# Patient Record
Sex: Female | Born: 1935 | Race: White | Hispanic: No | Marital: Married | Smoking: Former smoker
Health system: Southern US, Community
[De-identification: ages and names within clinical notes are randomized; demographics above are authoritative.]

## PROBLEM LIST (undated history)

## (undated) DIAGNOSIS — N179 Acute kidney failure, unspecified: Secondary | ICD-10-CM

## (undated) DIAGNOSIS — E7800 Pure hypercholesterolemia, unspecified: Secondary | ICD-10-CM

## (undated) DIAGNOSIS — I10 Essential (primary) hypertension: Secondary | ICD-10-CM

## (undated) DIAGNOSIS — G459 Transient cerebral ischemic attack, unspecified: Secondary | ICD-10-CM

## (undated) DIAGNOSIS — I509 Heart failure, unspecified: Secondary | ICD-10-CM

## (undated) DIAGNOSIS — I480 Paroxysmal atrial fibrillation: Secondary | ICD-10-CM

## (undated) HISTORY — PX: BACK SURGERY: SHX140

---

## 1997-12-13 ENCOUNTER — Other Ambulatory Visit: Admission: RE | Admit: 1997-12-13 | Discharge: 1997-12-13 | Payer: Self-pay | Admitting: Family Medicine

## 1998-12-17 ENCOUNTER — Other Ambulatory Visit: Admission: RE | Admit: 1998-12-17 | Discharge: 1998-12-17 | Payer: Self-pay | Admitting: Family Medicine

## 1999-12-23 ENCOUNTER — Other Ambulatory Visit: Admission: RE | Admit: 1999-12-23 | Discharge: 1999-12-23 | Payer: Self-pay | Admitting: Family Medicine

## 1999-12-28 ENCOUNTER — Encounter: Admission: RE | Admit: 1999-12-28 | Discharge: 1999-12-28 | Payer: Self-pay | Admitting: Family Medicine

## 1999-12-28 ENCOUNTER — Encounter: Payer: Self-pay | Admitting: Family Medicine

## 2001-01-02 ENCOUNTER — Encounter: Payer: Self-pay | Admitting: Family Medicine

## 2001-01-02 ENCOUNTER — Encounter: Admission: RE | Admit: 2001-01-02 | Discharge: 2001-01-02 | Payer: Self-pay | Admitting: Family Medicine

## 2001-06-02 ENCOUNTER — Other Ambulatory Visit: Admission: RE | Admit: 2001-06-02 | Discharge: 2001-06-02 | Payer: Self-pay | Admitting: Family Medicine

## 2001-06-05 ENCOUNTER — Encounter: Admission: RE | Admit: 2001-06-05 | Discharge: 2001-06-05 | Payer: Self-pay | Admitting: Family Medicine

## 2001-06-05 ENCOUNTER — Encounter: Payer: Self-pay | Admitting: Family Medicine

## 2001-10-31 ENCOUNTER — Encounter: Admission: RE | Admit: 2001-10-31 | Discharge: 2001-10-31 | Payer: Self-pay | Admitting: Family Medicine

## 2001-10-31 ENCOUNTER — Encounter: Payer: Self-pay | Admitting: Family Medicine

## 2002-05-28 ENCOUNTER — Encounter: Admission: RE | Admit: 2002-05-28 | Discharge: 2002-05-28 | Payer: Self-pay | Admitting: Family Medicine

## 2002-05-28 ENCOUNTER — Encounter: Payer: Self-pay | Admitting: Family Medicine

## 2002-06-04 ENCOUNTER — Other Ambulatory Visit: Admission: RE | Admit: 2002-06-04 | Discharge: 2002-06-04 | Payer: Self-pay | Admitting: Family Medicine

## 2003-06-19 ENCOUNTER — Other Ambulatory Visit: Admission: RE | Admit: 2003-06-19 | Discharge: 2003-06-19 | Payer: Self-pay | Admitting: Family Medicine

## 2003-06-26 ENCOUNTER — Encounter: Admission: RE | Admit: 2003-06-26 | Discharge: 2003-06-26 | Payer: Self-pay | Admitting: Family Medicine

## 2003-07-24 ENCOUNTER — Encounter: Admission: RE | Admit: 2003-07-24 | Discharge: 2003-10-22 | Payer: Self-pay | Admitting: Family Medicine

## 2004-06-25 ENCOUNTER — Encounter: Admission: RE | Admit: 2004-06-25 | Discharge: 2004-06-25 | Payer: Self-pay | Admitting: Family Medicine

## 2004-06-26 ENCOUNTER — Encounter: Admission: RE | Admit: 2004-06-26 | Discharge: 2004-06-26 | Payer: Self-pay | Admitting: Family Medicine

## 2006-06-23 ENCOUNTER — Other Ambulatory Visit: Admission: RE | Admit: 2006-06-23 | Discharge: 2006-06-23 | Payer: Self-pay | Admitting: Family Medicine

## 2006-07-08 ENCOUNTER — Encounter: Admission: RE | Admit: 2006-07-08 | Discharge: 2006-07-08 | Payer: Self-pay | Admitting: Family Medicine

## 2006-07-14 ENCOUNTER — Encounter: Admission: RE | Admit: 2006-07-14 | Discharge: 2006-07-14 | Payer: Self-pay | Admitting: Family Medicine

## 2006-11-06 ENCOUNTER — Inpatient Hospital Stay (HOSPITAL_COMMUNITY): Admission: EM | Admit: 2006-11-06 | Discharge: 2006-11-10 | Payer: Self-pay | Admitting: Emergency Medicine

## 2006-11-21 ENCOUNTER — Encounter: Admission: RE | Admit: 2006-11-21 | Discharge: 2006-11-21 | Payer: Self-pay | Admitting: Neurosurgery

## 2006-11-25 ENCOUNTER — Inpatient Hospital Stay (HOSPITAL_COMMUNITY): Admission: RE | Admit: 2006-11-25 | Discharge: 2006-11-28 | Payer: Self-pay | Admitting: Neurosurgery

## 2006-12-25 ENCOUNTER — Ambulatory Visit: Payer: Self-pay | Admitting: Internal Medicine

## 2006-12-25 ENCOUNTER — Inpatient Hospital Stay (HOSPITAL_COMMUNITY): Admission: EM | Admit: 2006-12-25 | Discharge: 2006-12-30 | Payer: Self-pay | Admitting: Emergency Medicine

## 2006-12-27 ENCOUNTER — Encounter: Payer: Self-pay | Admitting: Internal Medicine

## 2006-12-27 ENCOUNTER — Encounter (INDEPENDENT_AMBULATORY_CARE_PROVIDER_SITE_OTHER): Payer: Self-pay | Admitting: Neurological Surgery

## 2006-12-28 ENCOUNTER — Ambulatory Visit: Payer: Self-pay | Admitting: Physical Medicine & Rehabilitation

## 2007-11-02 ENCOUNTER — Encounter: Admission: RE | Admit: 2007-11-02 | Discharge: 2007-11-02 | Payer: Self-pay | Admitting: Family Medicine

## 2007-12-02 ENCOUNTER — Emergency Department (HOSPITAL_COMMUNITY): Admission: EM | Admit: 2007-12-02 | Discharge: 2007-12-02 | Payer: Self-pay | Admitting: Emergency Medicine

## 2008-06-20 ENCOUNTER — Emergency Department (HOSPITAL_COMMUNITY): Admission: EM | Admit: 2008-06-20 | Discharge: 2008-06-20 | Payer: Self-pay | Admitting: Emergency Medicine

## 2010-03-05 ENCOUNTER — Inpatient Hospital Stay (HOSPITAL_COMMUNITY): Admission: EM | Admit: 2010-03-05 | Discharge: 2010-01-05 | Payer: Self-pay | Admitting: Emergency Medicine

## 2010-06-11 LAB — BASIC METABOLIC PANEL
BUN: 26 mg/dL — ABNORMAL HIGH (ref 6–23)
CO2: 27 mEq/L (ref 19–32)
Calcium: 9.3 mg/dL (ref 8.4–10.5)
Chloride: 101 mEq/L (ref 96–112)
Chloride: 103 mEq/L (ref 96–112)
Chloride: 107 mEq/L (ref 96–112)
Creatinine, Ser: 1.21 mg/dL — ABNORMAL HIGH (ref 0.4–1.2)
Creatinine, Ser: 1.5 mg/dL — ABNORMAL HIGH (ref 0.4–1.2)
GFR calc Af Amer: 40 mL/min — ABNORMAL LOW (ref >60)
GFR calc Af Amer: 51 mL/min — ABNORMAL LOW (ref >60)
GFR calc non Af Amer: 42 mL/min — ABNORMAL LOW (ref >60)
GFR calc non Af Amer: 42 mL/min — ABNORMAL LOW (ref >60)
GFR calc non Af Amer: 52 mL/min — ABNORMAL LOW (ref >60)
Glucose, Bld: 118 mg/dL — ABNORMAL HIGH (ref 70–99)
Glucose, Bld: 122 mg/dL — ABNORMAL HIGH (ref 70–99)
Potassium: 3.8 mEq/L (ref 3.5–5.1)
Potassium: 4.1 mEq/L (ref 3.5–5.1)
Potassium: 4.4 mEq/L (ref 3.5–5.1)
Potassium: 4.5 mEq/L (ref 3.5–5.1)
Sodium: 137 mEq/L (ref 135–145)
Sodium: 137 mEq/L (ref 135–145)
Sodium: 138 mEq/L (ref 135–145)

## 2010-06-11 LAB — CARDIAC PANEL(CRET KIN+CKTOT+MB+TROPI)
CK, MB: 5.8 ng/mL — ABNORMAL HIGH (ref 0.3–4.0)
CK, MB: 8.2 ng/mL (ref 0.3–4.0)
Relative Index: 2.6 — ABNORMAL HIGH (ref 0.0–2.5)
Relative Index: 3 — ABNORMAL HIGH (ref 0.0–2.5)
Relative Index: 3 — ABNORMAL HIGH (ref 0.0–2.5)
Total CK: 270 U/L — ABNORMAL HIGH (ref 7–177)
Troponin I: 0.02 ng/mL (ref 0.00–0.06)

## 2010-06-11 LAB — COMPREHENSIVE METABOLIC PANEL
ALT: 17 U/L (ref 0–35)
AST: 28 U/L (ref 0–37)
Albumin: 3.7 g/dL (ref 3.5–5.2)
CO2: 27 mEq/L (ref 19–32)
Calcium: 8.7 mg/dL (ref 8.4–10.5)
Creatinine, Ser: 1.53 mg/dL — ABNORMAL HIGH (ref 0.4–1.2)
GFR calc Af Amer: 39 mL/min — ABNORMAL LOW (ref >60)
GFR calc non Af Amer: 32 mL/min — ABNORMAL LOW (ref >60)
Sodium: 139 mEq/L (ref 135–145)
Total Protein: 6.5 g/dL (ref 6.0–8.3)

## 2010-06-11 LAB — DIFFERENTIAL
Basophils Absolute: 0 10*3/uL (ref 0.0–0.1)
Basophils Relative: 0 % (ref 0–1)
Eosinophils Absolute: 0.1 10*3/uL (ref 0.0–0.7)
Eosinophils Relative: 1 % (ref 0–5)
Monocytes Absolute: 0.3 10*3/uL (ref 0.1–1.0)
Monocytes Relative: 5 % (ref 3–12)
Neutro Abs: 5.3 10*3/uL (ref 1.7–7.7)

## 2010-06-11 LAB — CBC
HCT: 30.9 % — ABNORMAL LOW (ref 36.0–46.0)
HCT: 31.7 % — ABNORMAL LOW (ref 36.0–46.0)
HCT: 35.8 % — ABNORMAL LOW (ref 36.0–46.0)
Hemoglobin: 10.7 g/dL — ABNORMAL LOW (ref 12.0–15.0)
Hemoglobin: 10.8 g/dL — ABNORMAL LOW (ref 12.0–15.0)
Hemoglobin: 10.9 g/dL — ABNORMAL LOW (ref 12.0–15.0)
Hemoglobin: 12.3 g/dL (ref 12.0–15.0)
MCH: 32.1 pg (ref 26.0–34.0)
MCH: 32.2 pg (ref 26.0–34.0)
MCH: 32.4 pg (ref 26.0–34.0)
MCHC: 34.2 g/dL (ref 30.0–36.0)
MCHC: 34.4 g/dL (ref 30.0–36.0)
MCHC: 34.5 g/dL (ref 30.0–36.0)
MCV: 93.2 fL (ref 78.0–100.0)
MCV: 93.9 fL (ref 78.0–100.0)
Platelets: 122 10*3/uL — ABNORMAL LOW (ref 150–400)
Platelets: 131 10*3/uL — ABNORMAL LOW (ref 150–400)
RBC: 3.29 MIL/uL — ABNORMAL LOW (ref 3.87–5.11)
RBC: 3.31 MIL/uL — ABNORMAL LOW (ref 3.87–5.11)
RDW: 12.9 % (ref 11.5–15.5)
RDW: 13.1 % (ref 11.5–15.5)
RDW: 13.3 % (ref 11.5–15.5)
WBC: 5.3 10*3/uL (ref 4.0–10.5)
WBC: 5.4 10*3/uL (ref 4.0–10.5)

## 2010-06-11 LAB — SODIUM, URINE, RANDOM: Sodium, Ur: 67 mEq/L

## 2010-08-11 NOTE — H&P (Signed)
Yvonne [name], Yvonne [name]             ACCOUNT NO.:  [id number]   MEDICAL RECORD NO.:  [id number]          PATIENT TYPE:  INP   LOCATION:  1422                         FACILITY:  [facility]   PHYSICIAN:  Hollice Espy, M.D.DATE OF BIRTH:  Aug 19, 1918   DATE OF ADMISSION:  11/06/2006  DATE OF DISCHARGE:                              HISTORY & PHYSICAL   PRIMARY CARE PHYSICIAN:  Chales Salmon. Abigail Miyamoto, M.D.   CHIEF COMPLAINT:  Weakness.   HISTORY OF PRESENT ILLNESS:  The patient is an 77 year old, white female  with past medical history of lower lumbar DJD who for the last few days  has been doing relatively well, although she has noted a cough with  greenish productive sputum.  She says otherwise, she has not felt short  of breath or no chest pain.  Today, she has been status post two  epidural injections for her lower back pain with moderate relief, but  today, she started feeling weak and started feeling that she could not  stand up.  When she started to fall over, she was able to ease herself  to the ground, but could not get up.  She called a friend who was able  to call paramedics and the patient was taken over to the emergency room.  In the emergency room, it was noted that she had a cough and she had a  chest x-ray which showed a nonspecific infiltrate.  She also had an EKG  done which showed flipped T waves in leads V2-V6.  No other EKG was  present.  With these findings, it was felt best that she come in to the  hospital as well as for balance and stability.  Currently, the patient  is actually doing well.  She says that if she does not turn her back a  certain way, she does not have bilateral leg and lower back pain.  She  does complain of continued productive cough, but she denies any  headaches, vision changes, dysphagia, no chest pain or palpitations and  no shortness of breath.  She does complain of some mild wheezing, no  abdominal pain, no hematuria, no dysuria, no constipation  or diarrhea.  She does complain of lower back pain which radiates down her bilateral  lower legs.  Her review of systems is otherwise negative.   PAST MEDICAL HISTORY:  1. Lumbar DJD.  2. History of arrhythmia in the past.   MEDICATIONS:  1. Flexeril 5 mg p.o. t.i.d.  2. Simvastatin 80 mg p.o. daily.  3. Cardizem 90 mg p.o. daily.  4. Ultram 50 mg p.o. four times a day p.r.n.   ALLERGIES:  No known drug allergies.   SOCIAL HISTORY:  Denies any tobacco, drug use or heavy alcohol use.  She  drinks socially.   FAMILY HISTORY:  Noncontributory.   PHYSICAL EXAMINATION:  VITAL SIGNS:  Temperature 98.2, heart rate 88,  blood pressure 132/51, respirations 15, O2 saturations 96% on room air.  GENERAL:  The patient is alert and oriented x3 in no apparent distress.  HEENT:  Normocephalic, atraumatic.  Mucous membranes are moist.  No  carotid bruits.  HEART:  Regular rate and rhythm, S1, S2.  LUNGS:  A few scattered rales.  ABDOMEN:  Soft, nontender, nondistended, positive bowel sounds.  EXTREMITIES:  No clubbing, cyanosis or edema, trace pitting edema.   LABORATORY DATA AND X-RAY FINDINGS:  BNP 66.  White count 9.3, H&H 13  and 38, MCV 91, platelet count 178, 86% neutrophils.  CPK 119, MB 2,  troponin I less than 0.05.  Blood cultures are drawn as per pneumonia  protocol.  Second set of cardiac markers are similar.   X-rays as per HPI.  EKG shows some flipped T waves in V2-V6.   ASSESSMENT/PLAN:  1. Pneumonia.  Will treat the patient on intravenous Avelox plus      nebulizers.  2. Abnormal electrocardiogram.  Will try to review a previous      electrocardiogram.  In the meantime, repeat electrocardiogram and      cardiac enzymes in the morning.  The patient has had no episodes of      chest pain.  3. History of arrhythmia.  She is status post a cardiac      catheterization in the past which has been completely unremarkable.  4. Weakness, likely secondary to lumbar degenerative  joint disease.      Will get a physical/occupational therapy evaluation.  Consider      palliative care if her pain is not able to be controlled.      Hollice Espy, M.D.  Electronically Signed     SKK/MEDQ  D:  11/06/2006  T:  11/07/2006  Job:  [id number]   cc:   Hilda Lias, M.D.  Fax: [phone]   Chales Salmon. Abigail Miyamoto, M.D.  Fax: [phone]

## 2010-08-11 NOTE — Op Note (Signed)
NAMEUCHENNA, SEUFERT             ACCOUNT NO.:  [id number]   MEDICAL RECORD NO.:  [id number]          PATIENT TYPE:  INP   LOCATION:  3028                         FACILITY:  [facility]   PHYSICIAN:  Stefani Dama, M.D.  DATE OF BIRTH:  06-Oct-1918   DATE OF PROCEDURE:  12/27/2006  DATE OF DISCHARGE:                               OPERATIVE REPORT   PREOPERATIVE DIAGNOSIS:  Osteoporotic compression fracture.   POSTOPERATIVE DIAGNOSIS:  Osteoporotic compression fracture.   PROCEDURE:  L4 acrylic balloon kyphoplasty.   SURGEON:  Stefani Dama, M.D.   FIRST ASSISTANT:  None.   ANESTHESIA:  General endotracheal.   INDICATIONS:  Yvonne [name] is an 77 year old individual who underwent  laminectomy a few months ago by Dr. Jeral Fruit.  She was recovering well  with her back but developed a sudden and severe onset of pain when she  was getting up one day and was found to have now subacute compression  fractures likely secondary to osteoporosis at the L4 vertebra.  She is  now taken to the procedure room to undergo kyphoplasty.   PROCEDURE:  The patient brought to the operating room supine on the  stretcher.  After smooth induction of general endotracheal anesthesia  she was turned prone onto some lateral rolls and fluoroscopy was used to  image lumbar spine.  L4 vertebra was isolated, the skin was prepped with  DuraPrep and draped sterilely and then the area lateral to the pedicles  of L4 was anesthetized on the surface of the skin with 8 mL of lidocaine  with epinephrine 1:100,000 total and stab incision was created in this  region with #11 blade.  A Jamshidi needle was then passed to the lateral  superior aspect of the pedicle of L4 and then passed through the  pedicular bone into the vertebral body all the time using a AP and  lateral fluoro guidance.  On the left side then, a biopsy was obtained  and the bone was noted be severely softened and weak.  A balloon was  then deployed into  this area and the balloon was inflated but remained  isolated to the left side.  The right side was then instrumented in a  similar fashion and a balloon was deployed here and seemed to migrate  slightly to the medial aspect, the balloons were filled to 120 mmHg  pressure and decayed to consistently down to a level of 60.  Glue was  mixed at this point and when it reached the appropriate consistency a  total of 4.5 mL of glue was inserted into the left-sided aperture and a  total of 4 mL of glue was inserted into the right aperture.  This was  done under fluoroscopic control.  The filling was good across the  midline and seemed to restore the height of the vertebra nicely.  At  this point, the procedure was completed by removing  first trocars and then removing the Jamshidi needles.  No tails were  noted.  The skin was closed with a single 3-0 Vicryl suture in inverted  interrupted fashion in the subcuticular area.  The patient tolerated  procedure well, was returned to recovery in stable condition.      Stefani Dama, M.D.  Electronically Signed     HJE/MEDQ  D:  12/27/2006  T:  12/28/2006  Job:  [id number]

## 2010-08-11 NOTE — Discharge Summary (Signed)
Yvonne [name], Yvonne [name]             ACCOUNT NO.:  [id number]   MEDICAL RECORD NO.:  [id number]          PATIENT TYPE:  INP   LOCATION:  1422                         FACILITY:  [facility]   PHYSICIAN:  Yvonne [name], MDDATE OF BIRTH:  1918-05-08   DATE OF ADMISSION:  11/06/2006  DATE OF DISCHARGE:  11/10/2006                               DISCHARGE SUMMARY   PRIMARY CARE Yvonne [name]:  Dr. Abigail [name].   DISCHARGE DIAGNOSES:  1. Pneumonia.  2. Chronic back pain secondary to lumbar degenerative joint disease.  3. Hypertension, uncontrolled.  4. Constipation.   DISCHARGE MEDICATIONS:  1. Zocor 18 mg p.o. once daily.  2. Flexeril 500 t.i.d.  3. Cardizem XT 180 mg p.o. once daily.  4. Avelox 400 mg p.o. once daily for seven days and then stop.  5. Ultram 50 mg t.i.d.  6. Colace 100 mg p.o. b.i.d.  7. Gabapentin 300 mg p.o. b.i.d.  8. Senna 2 tabs p.o. every night.   CONSULTS:  I spoke with Dr. Jeral [name] on the phone. Apparently, the patient  is well known to Dr. Jeral [name].  He plans to see the patient in one to two  weeks.  Dr. Jeral [name] has promised to call the patient at home and make an  appointment.   IMAGING STUDIES:  1. Chest x-ray showed increased retrocardiac density on the left which      may reflect an infiltrate.   BRIEF HISTORY/HOSPITAL COURSE:  Please refer to H&P done on 11/06/2006.   The patient is an 77 year old female with a past medical history  significant for lumbar DJD and arrhythmias.  The patient presented with  weakness, cough productive of greenish sputum and low back pain.  A  chest x-ray done revealed retrocardiac infiltrate.  White count on  admission  was 9.3 with a left shift.   The patient was started on IV Avelox.  She was also given neb treatment.  The back pain was adequately controlled.  During her stay in the  hospital, the patient's blood pressure was noted to be elevated from  time to time.  The patient's Cardizem was increased to 180 mg once  daily.   Dr. Jeral [name] was contacted over the phone; please see above.   The patient has done well and will be discharged back home today to the  care of the primary care Yvonne [name].   DISCHARGE PLAN:  1. Start patient on above medications at home.  2. Follow up with Dr. Jeral [name] in one to two weeks.  3. Cardiac diet p.r.n.  4. Activity as tolerated.      Yvonne Chapel, MD  Electronically Signed     SIO/MEDQ  D:  11/10/2006  T:  11/10/2006  Job:  [id number]   cc:   Yvonne [name]. Yvonne [name], M.D.  Fax: [phone]   Yvonne [name], M.D.  Fax: [phone]

## 2010-08-11 NOTE — Discharge Summary (Signed)
[name], [name]             ACCOUNT NO.:  [id number]   MEDICAL RECORD NO.:  [id number]          PATIENT TYPE:  INP   LOCATION:  3029                         FACILITY:  [facility]   PHYSICIAN:  Hewitt Shorts, M.D.DATE OF BIRTH:  05-06-1918   DATE OF ADMISSION:  11/25/2006  DATE OF DISCHARGE:  11/28/2006                               DISCHARGE SUMMARY   HISTORY OF PRESENT ILLNESS:  The patient is an 77 year old woman under  the care of Dr. Jeral Fruit.  She had pain in her back, extending down into  the lower extremities bilaterally with multi-level, multi-factorial  lumbar stenosis.  The patient was admitted now for a lumbar  decompression from L3-S1.  Details of her history and examination are  included in Dr. Cassandria Santee admission note.   HOSPITAL COURSE:  The patient was admitted and underwent an L3, L4 and  L5 laminectomy with decompression of the L3, L4, L5 and S1 nerve roots.  Following surgery, she has done well.  We initially had her treated with  PCA that was discontinued.  She was seen in consultation by physical  therapy and occupational therapy and has made good progress.  She is up  and ambulating.  Her wound is healing nicely.  She is to return in 3  weeks to see Dr. Jeral Fruit for followup.  She has been given a prescription  for Percocet 1-2 tablets p.o. q.4-6 hours p.r.n. pain, 50 tablets, no  refills.  She will return to see Dr. Jeral Fruit in 3 weeks.   DISCHARGE DIAGNOSIS:  Lumbar stenosis.      Hewitt Shorts, M.D.  Electronically Signed     RWN/MEDQ  D:  11/28/2006  T:  11/28/2006  Job:  [id number]

## 2010-08-11 NOTE — Op Note (Signed)
NAMEHENLEE, DONOVAN             ACCOUNT NO.:  [id number]   MEDICAL RECORD NO.:  [id number]          PATIENT TYPE:  INP   LOCATION:  3029                         FACILITY:  [facility]   PHYSICIAN:  Hilda Lias, M.D.   DATE OF BIRTH:  Nov 20, 1918   DATE OF PROCEDURE:  11/25/2006  DATE OF DISCHARGE:                               OPERATIVE REPORT   PREOPERATIVE DIAGNOSIS:  L3-L4, L4-L5, and L5-S1 stenosis with chronic  radiculopathy and acute exacerbation.   POSTOPERATIVE DIAGNOSIS:  L3-L4, L4-L5, and L5-S1 stenosis with chronic  radiculopathy and acute exacerbation.   PROCEDURE:  Bilaterally L3, L4, L5 laminectomy, foraminotomy to  decompress the L3, L4, L5, and S1 nerve roots.   SURGEON:  Hilda Lias, M.D.   ASSISTANT:  Hewitt Shorts, M.D.   CLINICAL HISTORY:  The patient was seen by me in my office because of  back pain with radiation to both legs.  Later on she was admitted to  [facility] because of worsening of the pain.  Myelogram showed  that she, indeed, has stenosis at the level of L3-L4, L4-L5, and L5-S1.  Surgery was advised.  The risks were explained to her in the history and  physical.   DESCRIPTION OF PROCEDURE:  Ms. Birenbaum was taken to the OR and she was  positioned in a prone manner.  The skin was cleaned with DuraPrep.  We  inserted a needle in the spinous process and an x-ray was taken prior to  surgery but we were unable to see the needle whatsoever. From then on,  we palpated the L4-L5 space and an incision from L3 down to L5-S1 was  made.  The muscles were retracted laterally.  X-rays showed that,  indeed, we were in the spinous process of L3-L4.  Then with the Stille  rongeur, we removed the spinous process of L3, L4, and L5.  With the  drill, using the large and small drill, we did bilateral laminectomies.  The patient has quite a bit of thickening and calcified yellow ligament.  Using the 2, 3, and 4 mm Kerrison punch, we removed the  ligament  bilaterally and we did total laminectomy.  Then, using the same Kerrison  punch and the drill, we decompressed the L3, L4, L5, and S1 nerve roots.  The patient has quite a bit of calcification and quite a bit of  stenosis, worse at the level of L3-L4 and L4-L5.  From then on, having a  good decompression, the area was irrigated.  Valsalva maneuver was  negative.  Then, the wound was closed with Vicryl and Steri-Strips.           ______________________________  Hilda Lias, M.D.     EB/MEDQ  D:  11/25/2006  T:  11/27/2006  Job:  [id number]

## 2010-08-11 NOTE — Consult Note (Signed)
Yvonne [name], Yvonne [name]             ACCOUNT NO.:  [id number]   MEDICAL RECORD NO.:  [id number]          PATIENT TYPE:  INP   LOCATION:  3028                         FACILITY:  [facility]   PHYSICIAN:  Hilda Lias, M.D.   DATE OF BIRTH:  05/14/1918   DATE OF CONSULTATION:  12/25/2006  DATE OF DISCHARGE:                                 CONSULTATION   Yvonne [name] is a lady who underwent lumbar laminectomy several weeks ago.  She was seen by me 3 days ago in my office.  Complained of increased  back pain, and some pain down to her left leg.  X-ray shows some  osteoporosis with a surgical defect, but no evidence of fracture.  The  patient last night developed worsening of pain, and she was taken by  ambulance to [facility], where she was admitted, and an x-ray showed  some fracture of the vertebral body .  She was admitted by Dr. Suanne Marker.  I looked at the x-ray, and there has been some difference between the  one I saw her on Friday and from last night.  Because of that, we  recommended for Yvonne [name] to be transferred to [facility]  where we are going to continue with the pain control.  Also, we are  going to do an MRI to see if indeed how extensive is the fracture that  is mostly in the anterior vertebral body of L4, and what is the  relationship between that and the L4 and L5 nerve root on the left side.  The decision will be made to see if we need to do some type of  kyphoplasty of the L4 and also the __________ .  I fully explained to  her and the lady who was with her, and all of [place] agreed.           ______________________________  Hilda Lias, M.D.     EB/MEDQ  D:  12/25/2006  T:  12/25/2006  Job:  [id number]

## 2010-08-11 NOTE — H&P (Signed)
Yvonne [name], GRIMME             ACCOUNT NO.:  [id number]   MEDICAL RECORD NO.:  [id number]          PATIENT TYPE:  INP   LOCATION:  3028                         FACILITY:  [facility]   PHYSICIAN:  Corwin Levins, MD      DATE OF BIRTH:  02-19-1919   DATE OF ADMISSION:  12/25/2006  DATE OF DISCHARGE:                              HISTORY & PHYSICAL   CHIEF COMPLAINT:  Worsening low back pain.   HISTORY OF PRESENT ILLNESS:  Yvonne [name] is an 77 year old white female,  here with her daughter, who simply has been unable to function well  enough at home to go home.  She is status post L3 to S1 lumbar  decompression approximately 3 weeks ago.  She saw Dr. Jeral Fruit several  days ago postoperative.  She had fallen just several days before and she  complains of pain for the past week.  Plain films were done apparently  per Dr. Cassandria Santee office.  Plain films were repeated tonight with the  same being for patient and new L4 compression fracture noted per  Radiology.  She is simply unable to function well enough and ambulate to  go home.  Again, the pain is worse in the left lower back to the left  foot.   PAST MEDICAL HISTORY:  Illnesses:  1. History of lumbar stenosis status post L3 to S1 surgery August,      2008.  2. Depression.  3. Hypercholesterolemia.  4. Hypertension.  5. History of arrhythmia.  6. History of pneumonia August, 2008.   FAMILY HISTORY:  Noncontributory.   SOCIAL HISTORY:  1. No tobacco.  2. Alcohol, social use.   ILLICIT DRUGS:  None.   ALLERGIES:  No known drug allergies.   CURRENT MEDICATIONS:  Vicodin, Lexapro, meloxicam, metoprolol and Zocor,  doses of which are unclear.   REVIEW OF SYSTEMS:  Otherwise noncontributory.   PHYSICAL EXAM:  She is afebrile.  Pulse 72, respirations 18, blood  pressure 168/119, O2 saturation 97%.  ENT EXAM:  Sclera clear are clear. TMs are clear.  Oropharynx benign.  NECK:  Without lymph nodes, JVD, thyromegaly.  CHEST:  No rales  or wheezing.  HEART EXAM:  A regular rate and rhythm, no murmur.  ABDOMEN:  Soft, nontender.  Positive bowel sounds.  EXTREMITIES:  No edema.  NEUROLOGICAL:  Alert and oriented x3 and otherwise nonfocal.  We did not  force her to sit up, even though she is doing a bit better after Toradol  in the ER.   LABS:  A BMET with glucose 134, otherwise within normal limits.  Hemoglobin 11.2, white blood cell count 4.7, UA negative.  LS plain  films with new L4 compression fracture and a questionable bone fragment,  as above.  Pelvis films negative.   ASSESSMENT AND PLAN:  Intractable low back pain with prostration and new  L4 compression fracture.  She is to be admitted for pain control,  physical therapy, if able; neurosurgeon will likely need to see while  inpatient.  She is adamant that she does not want a closed MRI or CT and  this  may have to be readdressed.      Corwin Levins, MD  Electronically Signed     JWJ/MEDQ  D:  12/25/2006  T:  05/15/2007  Job:  [id number]

## 2010-08-11 NOTE — H&P (Signed)
[name], Yvonne [name]             ACCOUNT NO.:  [id number]   MEDICAL RECORD NO.:  [id number]          PATIENT TYPE:  INP   LOCATION:  3028                         FACILITY:  [facility]   PHYSICIAN:  Kela Millin, M.D.DATE OF BIRTH:  Apr 19, 1918   DATE OF ADMISSION:  12/25/2006  DATE OF DISCHARGE:  12/30/2006                              HISTORY & PHYSICAL   DICTATED BY:  __________   CHIEF COMPLAINT:  Worsening low back pain.   HISTORY OF PRESENT ILLNESS:  Yvonne [name] is an 77 year old white female,  here with her daughter, who simply has been unable to function well  enough at home to go home.  She is status post L3 to S1 lumbar  decompression approximately 3 weeks ago.  She saw Dr. Jeral Fruit several  days ago postoperative.  She had fallen just several days before and she  complains of pain for the past week.  Plain films were done apparently  per Dr. Cassandria Santee office.  Plain films were repeated tonight with the  same being for patient and new L4 compression fracture noted per  Radiology.  She is simply unable to function well enough and ambulate to  go home.  Again, the pain is worse in the left lower back to the left  foot.   PAST MEDICAL HISTORY:  Illnesses:  1. History of lumbar stenosis status post L3 to S1 surgery August,      2008.  2. Depression.  3. Hypercholesterolemia.  4. Hypertension.  5. History of arrhythmia.  6. History of pneumonia August, 2008.   FAMILY HISTORY:  Noncontributory.   SOCIAL HISTORY:  1. No tobacco.  2. Alcohol, social use.   ILLICIT DRUGS:  None.   ALLERGIES:  No known drug allergies.   CURRENT MEDICATIONS:  Vicodin, Lexapro, meloxicam, metoprolol and Zocor,  doses of which are unclear.   REVIEW OF SYSTEMS:  Otherwise noncontributory.   PHYSICAL EXAM:  She is afebrile.  Pulse 72, respirations 18, blood  pressure 168/119, O2 saturation 97%.  ENT EXAM:  Sclera clear are clear, TMs are clear, oropharynx benign.  NECK:  Without lymph  nodes, JVD, thyromegaly.  CHEST:  No rales or wheezing.  HEART EXAM:  A regular rate and rhythm, no murmur.  ABDOMEN:  Soft, nontender.  Positive bowel sounds.  EXTREMITIES:  No edema.  NEUROLOGICAL:  Alert and oriented x3 and otherwise nonfocal.  We did not  force her to sit up, even though she is doing a bit better after Toradol  in the ER.   LABS:  A BMET with glucose 134, otherwise within normal limits.  Hemoglobin 11.2, white blood cell count 4.7, UA negative.  LS plain  films with new L4 compression fracture and a questionable bone fragment,  as above.  Pelvis films negative.   ASSESSMENT AND PLAN:  Intractable low back pain with prostration and new  L4 compression fracture.  She is to be admitted for pain control,  physical therapy, if able; neurosurgeon will likely need to see while  inpatient.  She is adamant that she does not want a closed MRI or CT and  this may have to be readdressed.      Corinna L. Lendell Caprice, MD      Kela Millin, M.D.     CLS/MEDQ  D:  12/25/2006  T:  12/25/2006  Job:  [id number]   cc:   Chales Salmon. Abigail Miyamoto, M.D.  Fax: [phone]   Hilda Lias, M.D.  Fax: [phone]

## 2010-08-11 NOTE — Discharge Summary (Signed)
NAMELINDSEE, LABARRE             ACCOUNT NO.:  [id number]   MEDICAL RECORD NO.:  [id number]          PATIENT TYPE:  INP   LOCATION:  3028                         FACILITY:  [facility]   PHYSICIAN:  Corinna L. Lendell Caprice, MDDATE OF BIRTH:  Feb 23, 1919   DATE OF ADMISSION:  12/25/2006  DATE OF DISCHARGE:  12/30/2006                               DISCHARGE SUMMARY   STAT DISCHARGE SUMMARY   DISCHARGE DIAGNOSES:  1. L4 osteoporotic compression fracture.  2. Status post balloon kyphoplasty by Dr. Danielle Dess on December 27, 2006.  3. Deconditioning.  4. Recent L3,4,5 bilateral laminectomy for lumbar stenosis and      radiculopathy.  5. Constipation.  6. History of depression, not currently active/in remission.  7. Hyperlipidemia.  8. Hypertension.   DISCHARGE MEDICATIONS:  1. Colace 100 mg p.o. b.i.d.  2. MiraLax 17 g p.o. daily as needed for constipation.  3. Senicot 2 p.o. q.h.s. p.r.n. constipation.  4. Toprol XL 25 mg a day.  5. Simvastatin 80 mg a day.  6. Lexapro 10 mg a day.  7. Tylenol 650 mg p.o. q.4 h. p.r.n. pain.  8. Aspirin 325 mg a day.  9. Percocet one p.o. q.6 h. p.r.n. pain.  10.Flexeril 10 mg p.o. q.8 h. p.r.n. muscle spasm.   CONDITION:  Stable.   ACTIVITY:  Continue physical therapy, occupational therapy.   CONSULTATIONS:  Dr. Danielle Dess.   PROCEDURE:  See above.   DIET:  Low salt.  Follow-up with Dr. Jeral Fruit in 2 weeks.   PERTINENT LABORATORIES:  CBC significant for a hemoglobin of 10.8,  hematocrit 30.6, otherwise unremarkable.  Basic metabolic panel  unremarkable.  Urinalysis negative.   SPECIAL STUDIES AND RADIOLOGY:  Two views of the pelvis shows fracture  involving L4 vertebral body.  Lumbar x-ray showed postop changes in the  lower lumbar spine, compression fracture involving the right superior  aspect of L4.  MRI of the lumbar spine showed acute or subacute L4  compression fracture between 25 and 50 percent loss of vertebral body  height without  evidence of infection or osteomyelitis.  Postoperative  seroma and laminectomy defect L3 through L5.  Multi-level degenerative  disease.   HISTORY AND HOSPITAL COURSE:  Ms Quackenbush is an 77 year old White female  patient of Dr. Abigail Miyamoto who had recently had a laminectomy by Dr. Jeral Fruit.  Please see H&P for complete admission details.  She had been at home  postoperatively and had fallen several days prior to admission.  She had  complained of worsening back pain.  Plain films were reportedly done at  Dr. Cassandria Santee office, but she presented to the Emergency Room with the  complaints.  X-rays were repeated and she was found to have a new L4  compression fracture.  She was having difficulty ambulating and lives  alone.  She was therefore admitted to the hospital for further work-up,  pain control, etc.  Dr. Joslyn Devon al were consulted.  Patient had an MRI  and vertebroplasty with good results.  She has had difficulty ambulating  and is being transferred to skilled nursing facility for continued  rehabilitation.   During her hospitalization, she had constipation which has improved with  laxatives.  She will need to have stool softeners and laxatives as  needed at the nursing home.   Her other medical problems remain stable.  She does have a history of  depression, but has no signs or symptoms of active depression currently.      Corinna L. Lendell Caprice, MD  Electronically Signed     CLS/MEDQ  D:  12/30/2006  T:  12/30/2006  Job:  [id number]

## 2010-08-11 NOTE — H&P (Signed)
[name], Yvonne [name]             ACCOUNT NO.:  [id number]   MEDICAL RECORD NO.:  [id number]          PATIENT TYPE:  INP   LOCATION:  3029                         FACILITY:  [facility]   PHYSICIAN:  Hilda Lias, M.D.   DATE OF BIRTH:  1919-03-18   DATE OF ADMISSION:  11/25/2006  DATE OF DISCHARGE:                              HISTORY & PHYSICAL   Ms. Yvonne [name] is a lady who was seen in my office  on regular occasion  complaining of back pain going down to the both legs. All this started  when she fell about 3 years ago.  The patient is getting worse. She was  admitted to [facility] 2 weeks ago because of worsening pain and  myelogram was done which showed stenosis at L2, L3, L4, L4-5, L5-S1. She  wanted to proceed with surgery because she feels that she is getting  worse.   PAST MEDICAL HISTORY:  Negative.   ALLERGIES:  She is not allergic to any medications.   SOCIAL HISTORY:  Negative.   MEDICATIONS:  She is taking some metoprolol, aspirin and another  medication which she does not recall the name.   PHYSICAL EXAMINATION:  HEAD/NOSE/THROAT:  Normal.  NECK:  Normal.  LUNGS:  There is some mild rhonchi bilaterally.  HEART:  Heart sounds normal.  ABDOMEN:  Normal.  EXTREMITIES:  Normal pulses.  NEURO:  She has some weakness in both quadriceps. She has some mild  weakness and dorsiflexion in both legs.  The straight leg raising, SLR  is positive at 80 degrees and the femoral stretch maneuver is positive  bilaterally.  The lumbar myelogram showed that she has stenosis at the  level of L3-4 with a large left herniated disk, severe stenosis at the  level of L4-5, right worse than the left one and also facet arthropathy  with a stenosis at the level of L5-S1. She has a moderate narrowed canal  at the level of 2-3 to the left.   IMPRESSION:  Lumbar stenosis L3-4, L4-5, L5-S1.   RECOMMENDATIONS:  The patient wants to proceed with surgery.  The  procedure will be a bilateral L3, L4,  L5 laminectomy and foraminotomy.  She knows about the risks such as infection, CSF leak, no good  whatsoever, need for further surgery which might require fusion,  bleeding in the abdomen and all the risks associated with her age.           ______________________________  Hilda Lias, M.D.     EB/MEDQ  D:  11/25/2006  T:  11/27/2006  Job:  [id number]

## 2010-12-24 ENCOUNTER — Emergency Department (HOSPITAL_COMMUNITY): Payer: Medicare Other

## 2010-12-24 ENCOUNTER — Emergency Department (HOSPITAL_COMMUNITY)
Admission: EM | Admit: 2010-12-24 | Discharge: 2010-12-24 | Disposition: A | Discharge status: Home / Self Care | Payer: Medicare Other | Attending: Emergency Medicine | Admitting: Emergency Medicine

## 2010-12-24 DIAGNOSIS — R5381 Other malaise: Secondary | ICD-10-CM | POA: Insufficient documentation

## 2010-12-24 DIAGNOSIS — H81399 Other peripheral vertigo, unspecified ear: Secondary | ICD-10-CM | POA: Insufficient documentation

## 2010-12-24 DIAGNOSIS — R269 Unspecified abnormalities of gait and mobility: Secondary | ICD-10-CM | POA: Insufficient documentation

## 2010-12-24 DIAGNOSIS — J4489 Other specified chronic obstructive pulmonary disease: Secondary | ICD-10-CM | POA: Insufficient documentation

## 2010-12-24 DIAGNOSIS — R51 Headache: Secondary | ICD-10-CM | POA: Insufficient documentation

## 2010-12-24 DIAGNOSIS — Z79899 Other long term (current) drug therapy: Secondary | ICD-10-CM | POA: Insufficient documentation

## 2010-12-24 DIAGNOSIS — I10 Essential (primary) hypertension: Secondary | ICD-10-CM | POA: Insufficient documentation

## 2010-12-24 DIAGNOSIS — J449 Chronic obstructive pulmonary disease, unspecified: Secondary | ICD-10-CM | POA: Insufficient documentation

## 2010-12-24 LAB — COMPREHENSIVE METABOLIC PANEL
CO2: 26 mEq/L (ref 19–32)
Calcium: 9.5 mg/dL (ref 8.4–10.5)
Creatinine, Ser: 1.11 mg/dL — ABNORMAL HIGH (ref 0.50–1.10)
GFR calc Af Amer: 56 mL/min — ABNORMAL LOW (ref >60)
GFR calc non Af Amer: 46 mL/min — ABNORMAL LOW (ref >60)
Glucose, Bld: 107 mg/dL — ABNORMAL HIGH (ref 70–99)

## 2010-12-24 LAB — CBC
Hemoglobin: 12.5 g/dL (ref 12.0–15.0)
MCH: 31 pg (ref 26.0–34.0)
MCHC: 33.9 g/dL (ref 30.0–36.0)

## 2010-12-24 LAB — URINALYSIS, ROUTINE W REFLEX MICROSCOPIC
Glucose, UA: NEGATIVE mg/dL
Hgb urine dipstick: NEGATIVE
Ketones, ur: NEGATIVE mg/dL
Leukocytes, UA: NEGATIVE
Protein, ur: NEGATIVE mg/dL

## 2010-12-24 LAB — DIFFERENTIAL
Basophils Relative: 1 % (ref 0–1)
Eosinophils Relative: 3 % (ref 0–5)
Monocytes Absolute: 0.2 10*3/uL (ref 0.1–1.0)
Monocytes Relative: 4 % (ref 3–12)
Neutro Abs: 3.8 10*3/uL (ref 1.7–7.7)

## 2010-12-25 LAB — URINE CULTURE

## 2010-12-30 LAB — POCT CARDIAC MARKERS
CKMB, poc: 1.7
Myoglobin, poc: 186

## 2010-12-30 LAB — POCT I-STAT, CHEM 8
BUN: 34 — ABNORMAL HIGH
Calcium, Ion: 1.12
Chloride: 98
HCT: 38
Potassium: 3.5
Sodium: 134 — ABNORMAL LOW

## 2011-01-07 LAB — COMPREHENSIVE METABOLIC PANEL WITH GFR
ALT: 15
ALT: 18
AST: 14
AST: 18
Albumin: 3 — ABNORMAL LOW
Albumin: 3.2 — ABNORMAL LOW
Alkaline Phosphatase: 44
Alkaline Phosphatase: 50
BUN: 11
BUN: 16
CO2: 28
CO2: 29
Calcium: 8.5
Calcium: 8.6
Chloride: 102
Chloride: 103
Creatinine, Ser: 0.73
Creatinine, Ser: 0.79
GFR calc non Af Amer: >60
GFR calc non Af Amer: >60
Glucose, Bld: 104 — ABNORMAL HIGH
Glucose, Bld: 124 — ABNORMAL HIGH
Potassium: 3.8
Potassium: 3.9
Sodium: 136
Sodium: 138
Total Bilirubin: 1.2
Total Bilirubin: 1.2
Total Protein: 5.3 — ABNORMAL LOW
Total Protein: 5.7 — ABNORMAL LOW

## 2011-01-07 LAB — CBC
HCT: 30.3 — ABNORMAL LOW
HCT: 30.6 — ABNORMAL LOW
HCT: 32.4 — ABNORMAL LOW
Hemoglobin: 10.4 — ABNORMAL LOW
Hemoglobin: 10.8 — ABNORMAL LOW
MCHC: 34.4
MCHC: 34.6
MCHC: 35.4
MCV: 93.3
MCV: 93.7
MCV: 94.4
Platelets: 180
Platelets: 193
Platelets: 195
RBC: 3.21 — ABNORMAL LOW
RBC: 3.28 — ABNORMAL LOW
RDW: 14.2 — ABNORMAL HIGH
RDW: 14.4 — ABNORMAL HIGH
RDW: 14.7 — ABNORMAL HIGH
WBC: 4.4
WBC: 4.4
WBC: 4.7

## 2011-01-07 LAB — BASIC METABOLIC PANEL WITH GFR
BUN: 12
BUN: 13
CO2: 28
CO2: 28
Calcium: 8.5
Calcium: 8.9
Chloride: 102
Chloride: 106
Creatinine, Ser: 0.62
Creatinine, Ser: 0.77
GFR calc non Af Amer: >60
GFR calc non Af Amer: >60
Glucose, Bld: 117 — ABNORMAL HIGH
Glucose, Bld: 134 — ABNORMAL HIGH
Potassium: 3.6
Potassium: 3.8
Sodium: 139
Sodium: 142

## 2011-01-07 LAB — DIFFERENTIAL
Eosinophils Absolute: 0
Eosinophils Relative: 1
Lymphs Abs: 1.1

## 2011-01-07 LAB — URINALYSIS, ROUTINE W REFLEX MICROSCOPIC
Bilirubin Urine: NEGATIVE
Glucose, UA: NEGATIVE
Ketones, ur: NEGATIVE
pH: 7

## 2011-01-08 LAB — CBC
HCT: 38.9
MCHC: 34.7
MCV: 93
Platelets: 234
RDW: 14.2 — ABNORMAL HIGH

## 2011-01-08 LAB — BASIC METABOLIC PANEL
BUN: 16
CO2: 31
Chloride: 100
Glucose, Bld: 92
Potassium: 4.4

## 2011-01-11 LAB — DIFFERENTIAL
Basophils Absolute: 0
Basophils Relative: 0
Eosinophils Absolute: 0
Neutro Abs: 7.9 — ABNORMAL HIGH
Neutrophils Relative %: 86 — ABNORMAL HIGH

## 2011-01-11 LAB — POCT CARDIAC MARKERS
CKMB, poc: 2
CKMB, poc: 2.3
Myoglobin, poc: 119
Operator id: 3206
Troponin i, poc: <0.05
Troponin i, poc: <0.05

## 2011-01-11 LAB — CULTURE, BLOOD (ROUTINE X 2)

## 2011-01-11 LAB — CK TOTAL AND CKMB (NOT AT ARMC)
CK, MB: 2.4
Relative Index: INVALID

## 2011-01-11 LAB — BASIC METABOLIC PANEL
BUN: 27 — ABNORMAL HIGH
Chloride: 101
Glucose, Bld: 113 — ABNORMAL HIGH
Potassium: 4.1

## 2011-01-11 LAB — CBC
MCHC: 35.1
RDW: 13.4

## 2012-11-01 ENCOUNTER — Emergency Department (HOSPITAL_COMMUNITY): Payer: Medicare Other

## 2012-11-01 ENCOUNTER — Emergency Department (HOSPITAL_COMMUNITY)
Admission: EM | Admit: 2012-11-01 | Discharge: 2012-11-01 | Disposition: A | Discharge status: Home / Self Care | Payer: Medicare Other | Attending: Emergency Medicine | Admitting: Emergency Medicine

## 2012-11-01 ENCOUNTER — Other Ambulatory Visit: Payer: Self-pay

## 2012-11-01 DIAGNOSIS — R269 Unspecified abnormalities of gait and mobility: Secondary | ICD-10-CM | POA: Insufficient documentation

## 2012-11-01 DIAGNOSIS — R42 Dizziness and giddiness: Secondary | ICD-10-CM | POA: Insufficient documentation

## 2012-11-01 LAB — COMPREHENSIVE METABOLIC PANEL
BUN: 30 mg/dL — ABNORMAL HIGH (ref 6–23)
Calcium: 9.5 mg/dL (ref 8.4–10.5)
Creatinine, Ser: 1.19 mg/dL — ABNORMAL HIGH (ref 0.50–1.10)
GFR calc Af Amer: 45 mL/min — ABNORMAL LOW (ref >90)
GFR calc non Af Amer: 38 mL/min — ABNORMAL LOW (ref >90)
Glucose, Bld: 108 mg/dL — ABNORMAL HIGH (ref 70–99)
Sodium: 135 mEq/L (ref 135–145)
Total Protein: 7.6 g/dL (ref 6.0–8.3)

## 2012-11-01 LAB — CBC WITH DIFFERENTIAL/PLATELET
Eosinophils Absolute: 0.2 10*3/uL (ref 0.0–0.7)
Eosinophils Relative: 3 % (ref 0–5)
HCT: 38.5 % (ref 36.0–46.0)
Lymphs Abs: 1.2 10*3/uL (ref 0.7–4.0)
MCH: 32.1 pg (ref 26.0–34.0)
MCV: 92.3 fL (ref 78.0–100.0)
Monocytes Absolute: 0.5 10*3/uL (ref 0.1–1.0)
Platelets: 122 10*3/uL — ABNORMAL LOW (ref 150–400)
RBC: 4.17 MIL/uL (ref 3.87–5.11)

## 2012-11-01 LAB — URINE MICROSCOPIC-ADD ON

## 2012-11-01 LAB — URINALYSIS, ROUTINE W REFLEX MICROSCOPIC
Nitrite: NEGATIVE
Specific Gravity, Urine: 1.018 (ref 1.005–1.030)
Urobilinogen, UA: 0.2 mg/dL (ref 0.0–1.0)
pH: 5 (ref 5.0–8.0)

## 2012-11-01 MED ORDER — LORAZEPAM 1 MG PO TABS
1.0000 mg | ORAL_TABLET | Freq: Once | ORAL | Status: AC
  Administered 2012-11-01: 1 mg via ORAL
  Filled 2012-11-01: qty 1

## 2012-11-01 NOTE — ED Notes (Signed)
Pt reports that she started feeling "woozy" yesterday around 1600 and unsteady on her feet. Does not use walker to ambulate, but needed to use a walker yesterday. Pt denies headache, blurry vision, n/v.

## 2012-11-01 NOTE — ED Notes (Signed)
MD at bedside. 

## 2012-11-01 NOTE — ED Notes (Signed)
Pt escorted to discharge window in wheelchair. Pt verbalized understanding discharge instructions. In no acute distress.

## 2012-11-01 NOTE — ED Notes (Signed)
Patient transported to CT 

## 2012-11-01 NOTE — Progress Notes (Signed)
During 11/01/12 [facility] ED visit CM spoke with pt to confirm pp is Yvonne [name] EPIC updated

## 2012-11-01 NOTE — ED Provider Notes (Addendum)
CSN: [id number]     Arrival date & time 11/01/12  7829 History     First MD Initiated Contact with Patient 11/01/12 [phone]     Chief Complaint  Patient presents with  . Dizziness  . unsteady gait    (Consider location/radiation/quality/duration/timing/severity/associated sxs/prior Treatment) HPI 79 y.o. Female previously healthy female who lives alone states that she became dizzy and unsteady on her feet yesterday about 4 p.m.  Symptoms continue today.  No lateralized weakness, headache, vision changes, or speech difficulties.   No past medical history on file. No past surgical history on file. No family history on file. History  Substance Use Topics  . Smoking status: Not on file  . Smokeless tobacco: Not on file  . Alcohol Use: Not on file   OB History   No data available     Review of Systems  Allergies  Review of patient's allergies indicates not on file.  Home Medications  No current outpatient prescriptions on file. There were no vitals taken for this visit. Physical Exam  Nursing note and vitals reviewed. Constitutional: She is oriented to person, place, and time. She appears well-developed and well-nourished.  HENT:  Head: Normocephalic and atraumatic.  Right Ear: Tympanic membrane and external ear normal.  Left Ear: Tympanic membrane and external ear normal.  Nose: Nose normal. Right sinus exhibits no maxillary sinus tenderness and no frontal sinus tenderness. Left sinus exhibits no maxillary sinus tenderness and no frontal sinus tenderness.  Eyes: Conjunctivae and EOM are normal. Pupils are equal, round, and reactive to light. Right eye exhibits no nystagmus. Left eye exhibits no nystagmus.  Neck: Normal range of motion. Neck supple.  Cardiovascular: Normal rate, regular rhythm, normal heart sounds and intact distal pulses.   Pulmonary/Chest: Effort normal and breath sounds normal. No respiratory distress. She exhibits no tenderness.  Abdominal: Soft. Bowel  sounds are normal. She exhibits no distension and no mass. There is no tenderness.  Musculoskeletal: Normal range of motion. She exhibits no edema and no tenderness.  Neurological: She is alert and oriented to person, place, and time. She has normal strength and normal reflexes. No sensory deficit. She displays a negative Romberg sign. GCS eye subscore is 4. GCS verbal subscore is 5. GCS motor subscore is 6.  Reflex Scores:      Tricep reflexes are 2+ on the right side and 2+ on the left side.      Bicep reflexes are 2+ on the right side and 2+ on the left side.      Brachioradialis reflexes are 2+ on the right side and 2+ on the left side.      Patellar reflexes are 2+ on the right side and 2+ on the left side.      Achilles reflexes are 2+ on the right side and 2+ on the left side. Patient with normal gait without ataxia, shuffling, spasm, or antalgia. Speech is normal without dysarthria, dysphasia, or aphasia. Muscle strength is 5/5 in bilateral shoulders, elbow flexor and extensors, wrist flexor and extensors, and intrinsic hand muscles. 5/5 bilateral lower extremity hip flexors, extensors, knee flexors and extensors, and ankle dorsi and plantar flexors.    Skin: Skin is warm and dry. No rash noted.  Psychiatric: She has a normal mood and affect. Her behavior is normal. Judgment and thought content normal.    ED Course   Procedures (including critical care time)  Labs Reviewed  CBC WITH DIFFERENTIAL  COMPREHENSIVE METABOLIC PANEL  URINALYSIS, ROUTINE W REFLEX  MICROSCOPIC   No results found. No diagnosis found.  MDM   Results for orders placed during the hospital encounter of 11/01/12  CBC WITH DIFFERENTIAL      Result Value Range   WBC 6.2  4.0 - 10.5 K/uL   RBC 4.17  3.87 - 5.11 MIL/uL   Hemoglobin 13.4  12.0 - 15.0 g/dL   HCT 82.9  56.2 - 13.0 %   MCV 92.3  78.0 - 100.0 fL   MCH 32.1  26.0 - 34.0 pg   MCHC 34.8  30.0 - 36.0 g/dL   RDW 86.5  78.4 - 69.6 %   Platelets  122 (*) 150 - 400 K/uL   Neutrophils Relative % 70  43 - 77 %   Neutro Abs 4.4  1.7 - 7.7 K/uL   Lymphocytes Relative 19  12 - 46 %   Lymphs Abs 1.2  0.7 - 4.0 K/uL   Monocytes Relative 7  3 - 12 %   Monocytes Absolute 0.5  0.1 - 1.0 K/uL   Eosinophils Relative 3  0 - 5 %   Eosinophils Absolute 0.2  0.0 - 0.7 K/uL   Basophils Relative 0  0 - 1 %   Basophils Absolute 0.0  0.0 - 0.1 K/uL  COMPREHENSIVE METABOLIC PANEL      Result Value Range   Sodium 135  135 - 145 mEq/L   Potassium 4.8  3.5 - 5.1 mEq/L   Chloride 99  96 - 112 mEq/L   CO2 24  19 - 32 mEq/L   Glucose, Bld 108 (*) 70 - 99 mg/dL   BUN 30 (*) 6 - 23 mg/dL   Creatinine, Ser 2.95 (*) 0.50 - 1.10 mg/dL   Calcium 9.5  8.4 - 28.4 mg/dL   Total Protein 7.6  6.0 - 8.3 g/dL   Albumin 4.0  3.5 - 5.2 g/dL   AST 32  0 - 37 U/L   ALT 15  0 - 35 U/L   Alkaline Phosphatase 28 (*) 39 - 117 U/L   Total Bilirubin 0.8  0.3 - 1.2 mg/dL   GFR calc non Af Amer 38 (*) >90 mL/min   GFR calc Af Amer 45 (*) >90 mL/min  URINALYSIS, ROUTINE W REFLEX MICROSCOPIC      Result Value Range   Color, Urine YELLOW  YELLOW   APPearance CLEAR  CLEAR   Specific Gravity, Urine 1.018  1.005 - 1.030   pH 5.0  5.0 - 8.0   Glucose, UA NEGATIVE  NEGATIVE mg/dL   Hgb urine dipstick NEGATIVE  NEGATIVE   Bilirubin Urine NEGATIVE  NEGATIVE   Ketones, ur NEGATIVE  NEGATIVE mg/dL   Protein, ur NEGATIVE  NEGATIVE mg/dL   Urobilinogen, UA 0.2  0.0 - 1.0 mg/dL   Nitrite NEGATIVE  NEGATIVE   Leukocytes, UA MODERATE (*) NEGATIVE  URINE MICROSCOPIC-ADD ON      Result Value Range   Squamous Epithelial / LPF FEW (*) RARE   WBC, UA 3-6  <3 WBC/hpf   Bacteria, UA FEW (*) RARE   Ct Head Wo Contrast  11/01/2012   *RADIOLOGY REPORT*  Clinical Data: Dizziness.  Unsteady gait.  CT HEAD WITHOUT CONTRAST  Technique:  Contiguous axial images were obtained from the base of the skull through the vertex without contrast.  Comparison: Head CT 12/24/2010.  Findings: Physiologic  calcifications in the basal ganglia bilaterally.  Calcifications throughout the cerebellar hemispheres bilaterally is also similar to the prior study, presumably physiologic.  Mild cerebral and cerebellar atrophy.  Patchy and confluent areas of decreased attenuation throughout the deep and periventricular white matter of the cerebral hemispheres bilaterally similar to the prior study, compatible with chronic microvascular ischemic disease. No acute intracranial abnormalities.  Specifically, no evidence of acute intracranial hemorrhage, no definite findings of acute/subacute cerebral ischemia, no mass, mass effect, hydrocephalus or abnormal intra or extra-axial fluid collections.  Visualized paranasal sinuses and mastoids are well pneumatized.  No acute displaced skull fractures are identified.  IMPRESSION: 1.  No acute intracranial abnormalities. 2.  The appearance of the head and brain are similar to the prior study 12/24/2010, as above.   Original Report Authenticated By: Trudie Reed, M.D.   Mr Brain Wo Contrast  11/01/2012   *RADIOLOGY REPORT*  Clinical Data: Sudden onset dizziness and unsteady gait.  MRI HEAD WITHOUT CONTRAST  Technique:  Multiplanar, multiecho pulse sequences of the brain and surrounding structures were obtained according to standard protocol without intravenous contrast.  Comparison: MRI 12/24/2010.  CT 11/01/2012  Findings:  Negative for acute infarct.  Generalized atrophy.  Mild chronic microvascular ischemic change in the white matter.  Small chronic infarct left thalamus and left pons.  Negative for cerebellar infarct.  Ventricle size is normal.  Pituitary is normal in size.  Negative for hemorrhage or mass lesion.  Paranasal sinuses are clear  IMPRESSION:  Atrophy and chronic microvascular ischemia.  No acute abnormality.   Original Report Authenticated By: Janeece Riggers, M.D.    Patient with unsteady gait without evidence of cva on mri.  Patient ambulatory with complaints of  vertigo.  Patient advised to use walker at home and follow up with pmd.  She voices understanding and agreement with plan.  Patient with recheck bp 185/47- plan continue antihypertensive and recheck bp with pmd on 1-2 days.  Hilario Quarry, MD 11/01/12 1204  Hilario Quarry, MD 11/01/12 [phone]

## 2012-11-02 LAB — URINE CULTURE

## 2013-04-04 ENCOUNTER — Encounter (HOSPITAL_COMMUNITY): Payer: Self-pay | Admitting: Emergency Medicine

## 2013-04-04 ENCOUNTER — Observation Stay (HOSPITAL_COMMUNITY): Payer: Medicare Other

## 2013-04-04 ENCOUNTER — Other Ambulatory Visit (HOSPITAL_COMMUNITY): Payer: Medicare Other

## 2013-04-04 ENCOUNTER — Inpatient Hospital Stay (HOSPITAL_COMMUNITY)
Admission: EM | Admit: 2013-04-04 | Discharge: 2013-04-09 | DRG: 064 | Disposition: A | Discharge status: Skilled Nursing Facility | Payer: Medicare Other | Attending: Internal Medicine | Admitting: Internal Medicine

## 2013-04-04 ENCOUNTER — Emergency Department (HOSPITAL_COMMUNITY): Payer: Medicare Other

## 2013-04-04 DIAGNOSIS — R0609 Other forms of dyspnea: Secondary | ICD-10-CM | POA: Diagnosis present

## 2013-04-04 DIAGNOSIS — I491 Atrial premature depolarization: Secondary | ICD-10-CM

## 2013-04-04 DIAGNOSIS — R0989 Other specified symptoms and signs involving the circulatory and respiratory systems: Secondary | ICD-10-CM | POA: Diagnosis present

## 2013-04-04 DIAGNOSIS — I639 Cerebral infarction, unspecified: Secondary | ICD-10-CM | POA: Diagnosis present

## 2013-04-04 DIAGNOSIS — R4789 Other speech disturbances: Secondary | ICD-10-CM | POA: Diagnosis present

## 2013-04-04 DIAGNOSIS — R9431 Abnormal electrocardiogram [ECG] [EKG]: Secondary | ICD-10-CM

## 2013-04-04 DIAGNOSIS — I739 Peripheral vascular disease, unspecified: Secondary | ICD-10-CM

## 2013-04-04 DIAGNOSIS — I6350 Cerebral infarction due to unspecified occlusion or stenosis of unspecified cerebral artery: Principal | ICD-10-CM | POA: Diagnosis present

## 2013-04-04 DIAGNOSIS — I951 Orthostatic hypotension: Secondary | ICD-10-CM | POA: Diagnosis present

## 2013-04-04 DIAGNOSIS — R42 Dizziness and giddiness: Secondary | ICD-10-CM | POA: Diagnosis present

## 2013-04-04 DIAGNOSIS — Z87891 Personal history of nicotine dependence: Secondary | ICD-10-CM

## 2013-04-04 DIAGNOSIS — I498 Other specified cardiac arrhythmias: Secondary | ICD-10-CM

## 2013-04-04 DIAGNOSIS — N179 Acute kidney failure, unspecified: Secondary | ICD-10-CM

## 2013-04-04 DIAGNOSIS — R471 Dysarthria and anarthria: Secondary | ICD-10-CM | POA: Diagnosis present

## 2013-04-04 DIAGNOSIS — I4891 Unspecified atrial fibrillation: Secondary | ICD-10-CM | POA: Diagnosis present

## 2013-04-04 DIAGNOSIS — R4182 Altered mental status, unspecified: Secondary | ICD-10-CM | POA: Diagnosis present

## 2013-04-04 DIAGNOSIS — G459 Transient cerebral ischemic attack, unspecified: Secondary | ICD-10-CM

## 2013-04-04 DIAGNOSIS — I10 Essential (primary) hypertension: Secondary | ICD-10-CM | POA: Diagnosis present

## 2013-04-04 DIAGNOSIS — R062 Wheezing: Secondary | ICD-10-CM | POA: Diagnosis present

## 2013-04-04 DIAGNOSIS — Z8673 Personal history of transient ischemic attack (TIA), and cerebral infarction without residual deficits: Secondary | ICD-10-CM

## 2013-04-04 DIAGNOSIS — I779 Disorder of arteries and arterioles, unspecified: Secondary | ICD-10-CM

## 2013-04-04 DIAGNOSIS — N170 Acute kidney failure with tubular necrosis: Secondary | ICD-10-CM | POA: Diagnosis present

## 2013-04-04 DIAGNOSIS — Z8249 Family history of ischemic heart disease and other diseases of the circulatory system: Secondary | ICD-10-CM

## 2013-04-04 DIAGNOSIS — E785 Hyperlipidemia, unspecified: Secondary | ICD-10-CM | POA: Diagnosis present

## 2013-04-04 DIAGNOSIS — E860 Dehydration: Secondary | ICD-10-CM | POA: Diagnosis present

## 2013-04-04 DIAGNOSIS — R8290 Unspecified abnormal findings in urine: Secondary | ICD-10-CM | POA: Diagnosis present

## 2013-04-04 DIAGNOSIS — E7800 Pure hypercholesterolemia, unspecified: Secondary | ICD-10-CM | POA: Diagnosis present

## 2013-04-04 DIAGNOSIS — E876 Hypokalemia: Secondary | ICD-10-CM | POA: Diagnosis present

## 2013-04-04 DIAGNOSIS — J449 Chronic obstructive pulmonary disease, unspecified: Secondary | ICD-10-CM

## 2013-04-04 DIAGNOSIS — R82998 Other abnormal findings in urine: Secondary | ICD-10-CM | POA: Diagnosis present

## 2013-04-04 DIAGNOSIS — I480 Paroxysmal atrial fibrillation: Secondary | ICD-10-CM

## 2013-04-04 DIAGNOSIS — I519 Heart disease, unspecified: Secondary | ICD-10-CM | POA: Diagnosis present

## 2013-04-04 HISTORY — DX: Essential (primary) hypertension: I10

## 2013-04-04 HISTORY — DX: Pure hypercholesterolemia, unspecified: E78.00

## 2013-04-04 LAB — POCT I-STAT, CHEM 8
BUN: 40 mg/dL — ABNORMAL HIGH (ref 6–23)
CHLORIDE: 96 meq/L (ref 96–112)
CREATININE: 2 mg/dL — AB (ref 0.50–1.10)
Calcium, Ion: 1.13 mmol/L (ref 1.13–1.30)
Glucose, Bld: 112 mg/dL — ABNORMAL HIGH (ref 70–99)
HCT: 39 % (ref 36.0–46.0)
HEMOGLOBIN: 13.3 g/dL (ref 12.0–15.0)
POTASSIUM: 3.8 meq/L (ref 3.7–5.3)
Sodium: 134 mEq/L — ABNORMAL LOW (ref 137–147)
TCO2: 25 mmol/L (ref 0–100)

## 2013-04-04 LAB — COMPREHENSIVE METABOLIC PANEL WITH GFR
ALT: 21 U/L (ref 0–35)
AST: 33 U/L (ref 0–37)
Albumin: 3.8 g/dL (ref 3.5–5.2)
Alkaline Phosphatase: 29 U/L — ABNORMAL LOW (ref 39–117)
BUN: 42 mg/dL — ABNORMAL HIGH (ref 6–23)
CO2: 23 meq/L (ref 19–32)
Calcium: 8.8 mg/dL (ref 8.4–10.5)
Chloride: 92 meq/L — ABNORMAL LOW (ref 96–112)
Creatinine, Ser: 1.87 mg/dL — ABNORMAL HIGH (ref 0.50–1.10)
GFR calc Af Amer: 26 mL/min — ABNORMAL LOW
GFR calc non Af Amer: 22 mL/min — ABNORMAL LOW
Glucose, Bld: 115 mg/dL — ABNORMAL HIGH (ref 70–99)
Potassium: 4.1 meq/L (ref 3.7–5.3)
Sodium: 132 meq/L — ABNORMAL LOW (ref 137–147)
Total Bilirubin: 0.6 mg/dL (ref 0.3–1.2)
Total Protein: 7.3 g/dL (ref 6.0–8.3)

## 2013-04-04 LAB — POCT I-STAT TROPONIN I: Troponin i, poc: 0.02 ng/mL (ref 0.00–0.08)

## 2013-04-04 LAB — GLUCOSE, CAPILLARY
Glucose-Capillary: 105 mg/dL — ABNORMAL HIGH (ref 70–99)
Glucose-Capillary: 99 mg/dL (ref 70–99)

## 2013-04-04 LAB — CBC
HEMATOCRIT: 36.4 % (ref 36.0–46.0)
HEMOGLOBIN: 12.7 g/dL (ref 12.0–15.0)
MCH: 32.1 pg (ref 26.0–34.0)
MCHC: 34.9 g/dL (ref 30.0–36.0)
MCV: 91.9 fL (ref 78.0–100.0)
Platelets: 122 10*3/uL — ABNORMAL LOW (ref 150–400)
RBC: 3.96 MIL/uL (ref 3.87–5.11)
RDW: 13 % (ref 11.5–15.5)
WBC: 4.7 10*3/uL (ref 4.0–10.5)

## 2013-04-04 LAB — DIFFERENTIAL
BASOS PCT: 0 % (ref 0–1)
Basophils Absolute: 0 10*3/uL (ref 0.0–0.1)
EOS PCT: 0 % (ref 0–5)
Eosinophils Absolute: 0 10*3/uL (ref 0.0–0.7)
LYMPHS ABS: 0.9 10*3/uL (ref 0.7–4.0)
Lymphocytes Relative: 19 % (ref 12–46)
MONO ABS: 0.5 10*3/uL (ref 0.1–1.0)
Monocytes Relative: 10 % (ref 3–12)
Neutro Abs: 3.3 10*3/uL (ref 1.7–7.7)
Neutrophils Relative %: 71 % (ref 43–77)

## 2013-04-04 LAB — HEMOGLOBIN A1C
HEMOGLOBIN A1C: 5.9 % — AB (ref <5.7)
Mean Plasma Glucose: 123 mg/dL — ABNORMAL HIGH (ref <117)

## 2013-04-04 LAB — TROPONIN I

## 2013-04-04 LAB — ETHANOL: Alcohol, Ethyl (B): <11 mg/dL (ref 0–11)

## 2013-04-04 LAB — CG4 I-STAT (LACTIC ACID): Lactic Acid, Venous: 1 mmol/L (ref 0.5–2.2)

## 2013-04-04 MED ORDER — SODIUM CHLORIDE 0.9 % IV SOLN
INTRAVENOUS | Status: DC
  Administered 2013-04-04: 23:00:00 via INTRAVENOUS

## 2013-04-04 MED ORDER — LORAZEPAM 2 MG/ML IJ SOLN
1.0000 mg | Freq: Once | INTRAMUSCULAR | Status: AC
Start: 2013-04-04 — End: 2013-04-04
  Administered 2013-04-04: 1 mg via INTRAVENOUS
  Filled 2013-04-04: qty 1

## 2013-04-04 MED ORDER — ENOXAPARIN SODIUM 30 MG/0.3ML ~~LOC~~ SOLN
30.0000 mg | SUBCUTANEOUS | Status: DC
  Administered 2013-04-04 – 2013-04-06 (×3): 30 mg via SUBCUTANEOUS
  Filled 2013-04-04 (×4): qty 0.3

## 2013-04-04 MED ORDER — ASPIRIN 325 MG PO TABS
325.0000 mg | ORAL_TABLET | Freq: Every day | ORAL | Status: DC
  Administered 2013-04-04: 325 mg via ORAL
  Filled 2013-04-04 (×2): qty 1

## 2013-04-04 MED ORDER — SERTRALINE HCL 50 MG PO TABS
50.0000 mg | ORAL_TABLET | Freq: Every morning | ORAL | Status: DC
  Administered 2013-04-05 – 2013-04-09 (×5): 50 mg via ORAL
  Filled 2013-04-04 (×5): qty 1

## 2013-04-04 MED ORDER — DEXTROSE 5 % IV SOLN: 500.0000 mg | Freq: Once | INTRAVENOUS | Status: DC

## 2013-04-04 MED ORDER — COLESEVELAM HCL 625 MG PO TABS
1875.0000 mg | ORAL_TABLET | Freq: Two times a day (BID) | ORAL | Status: DC
  Administered 2013-04-05 – 2013-04-09 (×9): 1875 mg via ORAL
  Filled 2013-04-04 (×11): qty 3

## 2013-04-04 MED ORDER — CEFTRIAXONE SODIUM 1 G IJ SOLR: 1.0000 g | Freq: Once | INTRAMUSCULAR | Status: DC

## 2013-04-04 MED ORDER — SIMVASTATIN 40 MG PO TABS
40.0000 mg | ORAL_TABLET | Freq: Every day | ORAL | Status: DC
  Administered 2013-04-04 – 2013-04-05 (×2): 40 mg via ORAL
  Filled 2013-04-04 (×3): qty 1

## 2013-04-04 MED ORDER — METOPROLOL TARTRATE 12.5 MG HALF TABLET
12.5000 mg | ORAL_TABLET | Freq: Two times a day (BID) | ORAL | Status: DC
  Administered 2013-04-04: 23:00:00 12.5 mg via ORAL
  Filled 2013-04-04 (×3): qty 1

## 2013-04-04 MED ORDER — ACETAMINOPHEN 325 MG PO TABS: 650.0000 mg | ORAL_TABLET | ORAL | Status: DC | PRN

## 2013-04-04 NOTE — ED Notes (Signed)
Patient transported to MRI 

## 2013-04-04 NOTE — ED Notes (Addendum)
Pt c/o slurred speech, disorientation, and confusion since 1400.  Last seen normal was yesterday at lunch.  Family also states that she fell.

## 2013-04-04 NOTE — ED Provider Notes (Signed)
CSN: [id number]     Arrival date & time 04/04/13  1639 History   First MD Initiated Contact with Patient 04/04/13 1646     Chief Complaint  Patient presents with  . Slurred Speech   . Altered Mental Status   HPI Patient presents with concerns of one day of altered mental status.  She and family state past day there've been episodic bouts of speech difficulty, disorientation, confusion. No clear precipitating or alleviating or exacerbating factors. The patient currently has no complaints. In addition the patient had a fall earlier today. She states that upon awakening, moving from her bed, she felt lightheaded, fell. No loss of consciousness, and this was head pain, neck pain, chest pain,  Past Medical History  Diagnosis Date  . Hypertension   . Hypercholesteremia    Past Surgical History  Procedure Laterality Date  . Back surgery     History reviewed. No pertinent family history. History  Substance Use Topics  . Smoking status: Never Smoker   . Smokeless tobacco: Not on file  . Alcohol Use: No   OB History   Grav Para Term Preterm Abortions TAB SAB Ect Mult Living                 Review of Systems  Constitutional:       Per HPI, otherwise negative  HENT:       Per HPI, otherwise negative  Respiratory:       Per HPI, otherwise negative  Cardiovascular:       Per HPI, otherwise negative  Gastrointestinal: Negative for vomiting.  Endocrine:       Negative aside from HPI  Genitourinary:       Neg aside from HPI   Musculoskeletal:       Per HPI, otherwise negative  Skin: Negative.   Neurological: Positive for weakness and light-headedness. Negative for syncope.    Allergies  Review of patient's allergies indicates no known allergies.  Home Medications   Current Outpatient Rx  Name  Route  Sig  Dispense  Refill  . aspirin 325 MG EC tablet   Oral   Take 325 mg by mouth every morning.         . colesevelam (WELCHOL) 625 MG tablet   Oral   Take 1,875 mg by  mouth 2 (two) times daily with a meal.         . lisinopril-hydrochlorothiazide (PRINZIDE,ZESTORETIC) 10-12.5 MG per tablet   Oral   Take 1 tablet by mouth every morning.         . metoprolol (LOPRESSOR) 50 MG tablet   Oral   Take 50 mg by mouth 2 (two) times daily.         . sertraline (ZOLOFT) 50 MG tablet   Oral   Take 50 mg by mouth every morning.         . simvastatin (ZOCOR) 40 MG tablet   Oral   Take 40 mg by mouth at bedtime.         . Tetrahydroz-Polyvinyl Al-Povid (MURINE TEARS PLUS) 0.05-0.5-0.6 % SOLN   Both Eyes   Place 1 drop into both eyes daily as needed (For dry eyes.).          BP 107/51  Pulse 75  Temp(Src) 98.3 F (36.8 C) (Oral)  Resp 18  SpO2 95% Physical Exam  Nursing note and vitals reviewed. Constitutional: She is oriented to person, place, and time. She appears well-developed and well-nourished. No distress.  HENT:  Head: Normocephalic and atraumatic.  Eyes: Conjunctivae and EOM are normal.  Cardiovascular: Normal rate and regular rhythm.   Pulmonary/Chest: Effort normal and breath sounds normal. No stridor. No respiratory distress.  Abdominal: She exhibits no distension.  Musculoskeletal: She exhibits no edema.  Neurological: She is alert and oriented to person, place, and time. She displays no atrophy and no tremor. No cranial nerve deficit or sensory deficit. She exhibits normal muscle tone. She displays no seizure activity. Coordination and gait normal.  Skin: Skin is warm and dry.  Psychiatric: She has a normal mood and affect.    ED Course  Procedures (including critical care time) Labs Review Labs Reviewed  ETHANOL  PROTIME-INR  APTT  CBC  DIFFERENTIAL  COMPREHENSIVE METABOLIC PANEL  TROPONIN I  URINE RAPID DRUG SCREEN (HOSP PERFORMED)  URINALYSIS, ROUTINE W REFLEX MICROSCOPIC   Imaging Review No results found.  EKG Interpretation    Date/Time:  Wednesday April 04 2013 16:55:28 EST Ventricular Rate:  83 PR  Interval:  193 QRS Duration: 86 QT Interval:  385 QTC Calculation: 452 R Axis:   -4 Text Interpretation:  Sinus arrhythmia Nonspecific T abnormalities, lateral leads Sinus arrhythmia Artifact T wave abnormality Abnormal ekg Confirmed by Gerhard MunchLOCKWOOD, Sergei Delo  MD [phone]([id number]) on 04/04/2013 5:13:00 PM           After the initial evaluation I reviewed the patient's chart.  7:12 PM Patient in no distress.  I reviewed the CT results with her and family members.  With concern for stroke she required admission.  MDM   1. Altered mental status    This patient presents after 24 hours altered mental status episodes.  On exam she denies complaints, is neurovascularly intact, and in no distress.  Patient's evaluation is notable for dysrhythmia, CT evidence of likely stroke.  The patient was admitted for further evaluation and management.    Gerhard Munchobert Ivey Nembhard, MD 04/04/13 [phone]

## 2013-04-04 NOTE — H&P (Signed)
[facility] History and Physical  [name] ZOX:[id number] DOB: 1918/12/06 DOA: 04/04/2013  Referring physician:  PCP: Juluis Rainier   Chief Complaint: Disorientation and slurred speech HPI:  80 year old female no history of hypertension, dyslipidemia presents today with dizziness upon waking up this morning. No syncopal episode. The patient actually tripped and fell because she was imbalanced. Later during the course of the day, a family friend was also the POA noted that the patient had garbled speech she kept repeating the same words. No unilateral weakness no headache. CT scan in the ED shows recent infarcts, MRI pending, she is being admitted for TIA workup.  She had similar symptoms in August of 2014. She's had a previous carotid endarterectomy. A reported cardiac history. She has had some cough congestion for the last couple of weeks. She has a prior history of smoking but quit 20 years ago.         Review of Systems: negative for the following  Constitutional: As in history of present illness HEENT: Denies photophobia, eye pain, redness, hearing loss, ear pain, congestion, sore throat, rhinorrhea, sneezing, mouth sores, trouble swallowing, neck pain, neck stiffness and tinnitus.  Respiratory: Denies SOB, DOE, cough, chest tightness, and wheezing.  Cardiovascular: Denies chest pain, palpitations and leg swelling.  Gastrointestinal: Denies nausea, vomiting, abdominal pain, diarrhea, constipation, blood in stool and abdominal distention.  Genitourinary: Denies dysuria, urgency, frequency, hematuria, flank pain and difficulty urinating.  Musculoskeletal: Denies myalgias, back pain, joint swelling, arthralgias and gait problem.  Skin: Denies pallor, rash and wound.  Neurological: As in history of present illness Psychiatric/Behavioral: Denies suicidal ideation, mood changes, confusion, nervousness, sleep disturbance and agitation       Past Medical History   Diagnosis Date  . Hypertension   . Hypercholesteremia      Past Surgical History  Procedure Laterality Date  . Back surgery        Social History:  reports that she has never smoked. She does not have any smokeless tobacco history on file. She reports that she does not drink alcohol or use illicit drugs.    No Known Allergies  History reviewed. No pertinent family history.   Prior to Admission medications   Medication Sig Start Date End Date Taking? Authorizing Provider  aspirin 325 MG EC tablet Take 325 mg by mouth every morning.   Yes Historical Provider, MD  colesevelam (WELCHOL) 625 MG tablet Take 1,875 mg by mouth 2 (two) times daily with a meal.   Yes Historical Provider, MD  lisinopril-hydrochlorothiazide (PRINZIDE,ZESTORETIC) 10-12.5 MG per tablet Take 1 tablet by mouth every morning.   Yes Historical Provider, MD  metoprolol (LOPRESSOR) 50 MG tablet Take 50 mg by mouth 2 (two) times daily.   Yes Historical Provider, MD  sertraline (ZOLOFT) 50 MG tablet Take 50 mg by mouth every morning.   Yes Historical Provider, MD  simvastatin (ZOCOR) 40 MG tablet Take 40 mg by mouth at bedtime.   Yes Historical Provider, MD     Physical Exam: Filed Vitals:   04/04/13 1645 04/04/13 1656 04/04/13 1718 04/04/13 1821  BP: 104/62 107/51  142/71  Pulse: 83 75  95  Temp: 98.7 F (37.1 C) 98.3 F (36.8 C) 98 F (36.7 C)   TempSrc: Oral Oral    Resp: 14 18  26   SpO2: 97% 95%  95%     Constitutional: Vital signs reviewed. Patient is a well-developed and well-nourished in no acute distress and cooperative with exam. Alert and oriented x3.  Head: Normocephalic and atraumatic  Ear: TM normal bilaterally  Mouth: no erythema or exudates, MMM  Eyes: PERRL, EOMI, conjunctivae normal, No scleral icterus.  Neck: Supple, Trachea midline normal ROM, No JVD, mass, thyromegaly, or carotid bruit present.  Cardiovascular: RRR, S1 normal, S2 normal, no MRG, pulses symmetric and intact  bilaterally  Pulmonary/Chest: CTAB, no wheezes, rales, or rhonchi  Abdominal: Soft. Non-tender, non-distended, bowel sounds are normal, no masses, organomegaly, or guarding present.  GU: no CVA tenderness Musculoskeletal: No joint deformities, erythema, or stiffness, ROM full and no nontender Ext: no edema and no cyanosis, pulses palpable bilaterally (DP and PT)  Hematology: no cervical, inginal, or axillary adenopathy.  Neurological: A&O x3, Strenght is normal and symmetric bilaterally, cranial nerve II-XII are grossly intact, no focal motor deficit, sensory intact to light touch bilaterally.  Skin: Warm, dry and intact. No rash, cyanosis, or clubbing.  Psychiatric: Normal mood and affect. speech and behavior is normal. Judgment and thought content normal. Cognition and memory are normal.       Labs on Admission:    Basic Metabolic Panel:  Recent Labs Lab 04/04/13 1710 04/04/13 1723  NA 132* 134*  K 4.1 3.8  CL 92* 96  CO2 23  --   GLUCOSE 115* 112*  BUN 42* 40*  CREATININE 1.87* 2.00*  CALCIUM 8.8  --    Liver Function Tests:  Recent Labs Lab 04/04/13 1710  AST 33  ALT 21  ALKPHOS 29*  BILITOT 0.6  PROT 7.3  ALBUMIN 3.8   No results found for this basename: LIPASE, AMYLASE,  in the last 168 hours No results found for this basename: AMMONIA,  in the last 168 hours CBC:  Recent Labs Lab 04/04/13 1710 04/04/13 1723  WBC 4.7  --   NEUTROABS 3.3  --   HGB 12.7 13.3  HCT 36.4 39.0  MCV 91.9  --   PLT 122*  --    Cardiac Enzymes:  Recent Labs Lab 04/04/13 1710  TROPONINI <0.30    BNP (last 3 results) No results found for this basename: PROBNP,  in the last 8760 hours    CBG:  Recent Labs Lab 04/04/13 1713  GLUCAP 105*    Radiological Exams on Admission: Ct Head Wo Contrast  04/04/2013   CLINICAL DATA:  Altered mental status  EXAM: CT HEAD WITHOUT CONTRAST  TECHNIQUE: Contiguous axial images were obtained from the base of the skull through  the vertex without intravenous contrast. Study was obtained within 24 hr of patient's arrival at the emergency department.  COMPARISON:  Brain CT and brain MRI November 01, 2012  FINDINGS: Mild diffuse atrophy is stable. There is no mass effect, hemorrhage, extra-axial fluid collection, or midline shift.  There is a new area of decreased attenuation in the right globus pallidum and anterior limb of the right internal capsule. There is also new decreased attenuation in the head of the caudate nucleus on the right. There is an old prior infarct in the medial left pons. There is also evidence of prior small infarct in the left thalamus, better seen on prior MR. There is mild periventricular small vessel disease.  Calcification in the mid cerebellar hemispheres bilaterally is stable and felt to be physiologic. There is also mild basal ganglia calcification, felt to be physiologic.  Bony calvarium appears intact.  The mastoid air cells are clear.  IMPRESSION: Evidence of recent infarcts in the head of the caudate nucleus on the right with extension into the anterior limb  of the right internal capsule and medial right globus pallidum.  Prior small infarcts as noted above. There is mild atrophy with periventricular small vessel disease. Basal ganglia and cerebellar calcifications are stable and felt to most likely be physiologic in etiology.   Electronically Signed   By: Bretta BangWilliam  Woodruff M.D.   On: 04/04/2013 17:44    EKG: Independently reviewed. Pending  Assessment/Plan Active Problems:   Altered mental status   TIA (transient ischemic attack)   TIA Multiple risk factors including hypertension, dyslipidemia Previous history of smoking, carotid endarterectomy CT scan consistent with recent infarct Admit the patient for a full TIA workup MRI/MRA/carotid Doppler 2-D echo PT/OT/speech Lipid panel Hemoglobin A1c Admit for observation overnight Aspirin for antiplatelet therapy, may need need to be switched  to Plavix if MRI positive    Hypertension Blood pressure was actually 104 when the patient presented therefore we'll hold antihypertensive medication    Code Status:   full Family Communication: bedside Disposition Plan: Observation   Time spent: 70 mins   [facility] CenterBROL,[place] [facility] Pager [phone]  If 7PM-7AM, please contact night-coverage [email or web address] Password [email or web address] 04/04/2013, 7:04 PM

## 2013-04-04 NOTE — ED Notes (Signed)
Pt still at MRI

## 2013-04-05 ENCOUNTER — Encounter (HOSPITAL_COMMUNITY): Payer: Self-pay | Admitting: Neurology

## 2013-04-05 ENCOUNTER — Inpatient Hospital Stay (HOSPITAL_COMMUNITY): Payer: Medicare Other

## 2013-04-05 DIAGNOSIS — R8290 Unspecified abnormal findings in urine: Secondary | ICD-10-CM | POA: Diagnosis present

## 2013-04-05 DIAGNOSIS — G459 Transient cerebral ischemic attack, unspecified: Secondary | ICD-10-CM

## 2013-04-05 DIAGNOSIS — R4182 Altered mental status, unspecified: Secondary | ICD-10-CM

## 2013-04-05 DIAGNOSIS — I951 Orthostatic hypotension: Secondary | ICD-10-CM | POA: Diagnosis present

## 2013-04-05 DIAGNOSIS — N179 Acute kidney failure, unspecified: Secondary | ICD-10-CM

## 2013-04-05 DIAGNOSIS — R82998 Other abnormal findings in urine: Secondary | ICD-10-CM

## 2013-04-05 DIAGNOSIS — I6350 Cerebral infarction due to unspecified occlusion or stenosis of unspecified cerebral artery: Principal | ICD-10-CM

## 2013-04-05 DIAGNOSIS — E860 Dehydration: Secondary | ICD-10-CM

## 2013-04-05 DIAGNOSIS — E876 Hypokalemia: Secondary | ICD-10-CM

## 2013-04-05 DIAGNOSIS — I059 Rheumatic mitral valve disease, unspecified: Secondary | ICD-10-CM

## 2013-04-05 LAB — LIPID PANEL
Cholesterol: 140 mg/dL (ref 0–200)
HDL: 40 mg/dL (ref >39)
LDL Cholesterol: 70 mg/dL (ref 0–99)
TRIGLYCERIDES: 152 mg/dL — AB (ref <150)
Total CHOL/HDL Ratio: 3.5 RATIO
VLDL: 30 mg/dL (ref 0–40)

## 2013-04-05 LAB — GLUCOSE, CAPILLARY
GLUCOSE-CAPILLARY: 108 mg/dL — AB (ref 70–99)
GLUCOSE-CAPILLARY: 143 mg/dL — AB (ref 70–99)
Glucose-Capillary: 99 mg/dL (ref 70–99)
Glucose-Capillary: 121 mg/dL — ABNORMAL HIGH (ref 70–99)

## 2013-04-05 LAB — URINALYSIS, ROUTINE W REFLEX MICROSCOPIC
Bilirubin Urine: NEGATIVE
GLUCOSE, UA: NEGATIVE mg/dL
HGB URINE DIPSTICK: NEGATIVE
KETONES UR: NEGATIVE mg/dL
Nitrite: NEGATIVE
Protein, ur: NEGATIVE mg/dL
Specific Gravity, Urine: 1.013 (ref 1.005–1.030)
Urobilinogen, UA: 0.2 mg/dL (ref 0.0–1.0)
pH: 5 (ref 5.0–8.0)

## 2013-04-05 LAB — CBC
HEMATOCRIT: 34.5 % — AB (ref 36.0–46.0)
Hemoglobin: 12.1 g/dL (ref 12.0–15.0)
MCH: 32.4 pg (ref 26.0–34.0)
MCHC: 35.1 g/dL (ref 30.0–36.0)
MCV: 92.2 fL (ref 78.0–100.0)
Platelets: 103 10*3/uL — ABNORMAL LOW (ref 150–400)
RBC: 3.74 MIL/uL — ABNORMAL LOW (ref 3.87–5.11)
RDW: 13.1 % (ref 11.5–15.5)
WBC: 4.8 10*3/uL (ref 4.0–10.5)

## 2013-04-05 LAB — BASIC METABOLIC PANEL
BUN: 37 mg/dL — ABNORMAL HIGH (ref 6–23)
CHLORIDE: 97 meq/L (ref 96–112)
CO2: 22 meq/L (ref 19–32)
Calcium: 8.4 mg/dL (ref 8.4–10.5)
Creatinine, Ser: 1.36 mg/dL — ABNORMAL HIGH (ref 0.50–1.10)
GFR calc Af Amer: 38 mL/min — ABNORMAL LOW (ref >90)
GFR calc non Af Amer: 33 mL/min — ABNORMAL LOW (ref >90)
Glucose, Bld: 140 mg/dL — ABNORMAL HIGH (ref 70–99)
Potassium: 3.6 mEq/L — ABNORMAL LOW (ref 3.7–5.3)
SODIUM: 134 meq/L — AB (ref 137–147)

## 2013-04-05 LAB — RAPID URINE DRUG SCREEN, HOSP PERFORMED
Amphetamines: NOT DETECTED
BENZODIAZEPINES: NOT DETECTED
Barbiturates: NOT DETECTED
Cocaine: NOT DETECTED
OPIATES: NOT DETECTED
Tetrahydrocannabinol: NOT DETECTED

## 2013-04-05 LAB — PROTIME-INR
INR: 1 (ref 0.00–1.49)
PROTHROMBIN TIME: 13 s (ref 11.6–15.2)

## 2013-04-05 LAB — URINE MICROSCOPIC-ADD ON

## 2013-04-05 LAB — APTT: aPTT: 32 seconds (ref 24–37)

## 2013-04-05 MED ORDER — SODIUM CHLORIDE 0.9 % IV SOLN
INTRAVENOUS | Status: DC
  Administered 2013-04-05 (×2): via INTRAVENOUS

## 2013-04-05 MED ORDER — CLOPIDOGREL BISULFATE 75 MG PO TABS
75.0000 mg | ORAL_TABLET | Freq: Every day | ORAL | Status: DC
  Administered 2013-04-05 – 2013-04-06 (×2): 75 mg via ORAL
  Filled 2013-04-05 (×4): qty 1

## 2013-04-05 MED ORDER — METOPROLOL TARTRATE 50 MG PO TABS
50.0000 mg | ORAL_TABLET | Freq: Two times a day (BID) | ORAL | Status: DC
  Administered 2013-04-05 – 2013-04-09 (×8): 50 mg via ORAL
  Filled 2013-04-05 (×10): qty 1

## 2013-04-05 MED ORDER — POTASSIUM CHLORIDE CRYS ER 20 MEQ PO TBCR
40.0000 meq | EXTENDED_RELEASE_TABLET | Freq: Once | ORAL | Status: AC
  Administered 2013-04-05: 40 meq via ORAL
  Filled 2013-04-05: qty 2

## 2013-04-05 MED ORDER — IPRATROPIUM-ALBUTEROL 0.5-2.5 (3) MG/3ML IN SOLN
3.0000 mL | Freq: Four times a day (QID) | RESPIRATORY_TRACT | Status: DC
  Administered 2013-04-05 – 2013-04-08 (×10): 3 mL via RESPIRATORY_TRACT
  Filled 2013-04-05 (×10): qty 3

## 2013-04-05 MED ORDER — SODIUM CHLORIDE 0.9 % IV BOLUS (SEPSIS)
500.0000 mL | Freq: Once | INTRAVENOUS | Status: AC
Start: 2013-04-05 — End: 2013-04-05
  Administered 2013-04-05: 500 mL via INTRAVENOUS

## 2013-04-05 NOTE — Evaluation (Addendum)
Occupational Therapy Evaluation Patient Details Name: Yvonne [name] MRN: [id number] DOB: 1919/01/13 Today's Date: 04/05/2013 Time: 7829-5621 OT Time Calculation (min): 29 min  OT Assessment / Plan / Recommendation History of present illness Yvonne [name] is an 80 y.o. female who was brought to the hospital due to feeling dizzy, imbalance and dysarthria.  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 Follow up MRI shows acute nonhemorrhagic infarcts within the right caudate head and anterior limb internal capsule. Patient was on ASA daily prior to arrival and has been changed to Plavix while in hospital   Clinical Impression   Pt presents to OT with decreased I with ADL activity due to problems listed below. Pt will benefit from skilled OT to address ADL activity and help decide if [facility] or SNF most appropriate due to limited A at home    OT Assessment  Patient needs continued OT Services    Follow Up Recommendations  Home health OT;Supervision - Intermittent;SNF;Other (comment) (depending on A in the home from friends)    Barriers to Discharge Decreased caregiver support    Equipment Recommendations  None recommended by OT       Frequency  Min 2X/week    Precautions / Restrictions Precautions Precautions: Fall       ADL  Grooming: Set up Where Assessed - Grooming: Unsupported sitting Upper Body Bathing: Minimal assistance Where Assessed - Upper Body Bathing: Unsupported sitting Lower Body Bathing: Minimal assistance Where Assessed - Lower Body Bathing: Unsupported sit to stand Upper Body Dressing: Supervision/safety Where Assessed - Upper Body Dressing: Unsupported sitting Lower Body Dressing: Minimal assistance Where Assessed - Lower Body Dressing: Unsupported sit to stand Toilet Transfer: Minimal  assistance Toilet Transfer Method: Sit to stand Toileting - Clothing Manipulation and Hygiene: Minimal assistance Where Assessed - Toileting Clothing Manipulation and Hygiene: Standing    OT Diagnosis: Generalized weakness  OT Problem List: Decreased strength OT Treatment Interventions: Self-care/ADL training;Patient/family education   OT Goals(Current goals can be found in the care plan section) Acute Rehab OT Goals Patient Stated Goal: get home and see dog OT Goal Formulation: With patient Time For Goal Achievement: 04/19/13  Visit Information  Last OT Received On: 04/05/13 Assistance Needed: +1 History of Present Illness: Yvonne [name] is an 80 y.o. female who was brought to the hospital due to feeling dizzy, imbalance and dysarthria.  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 Follow up MRI shows acute nonhemorrhagic infarcts within the right caudate head and anterior limb internal capsule. Patient was on ASA daily prior to arrival and has been changed to Plavix while in hospital       Prior Functioning     Home Living Family/patient expects to be discharged to:: Private residence Living Arrangements: Alone Available Help at Discharge: Friend(s) Type of Home: House Home Access: Stairs to enter [employer] of Steps: 4 Entrance Stairs-Rails: Can reach both Home Layout: One level Home Equipment: Walker - 2 wheels;Shower seat Prior Function Level of Independence: Independent Communication Communication: No difficulties;HOH         Vision/Perception Vision - History Patient Visual Report: No change from baseline   Cognition  Cognition Arousal/Alertness: Awake/alert Overall Cognitive Status:  Within Functional Limits for tasks assessed    Extremity/Trunk Assessment Upper Extremity  Assessment Upper Extremity Assessment: Overall WFL for tasks assessed     Mobility Bed Mobility General bed mobility comments: needed S only and increased time Transfers Overall transfer level: Needs assistance Transfers: Sit to/from Stand General transfer comment: overall min a with bed ti chair transfer           End of Session OT - End of Session Activity Tolerance: Patient tolerated treatment well Patient left: in chair;with call bell/phone within reach;with family/visitor present Nurse Communication: Mobility status  GO     Alba CoryREDDING, Lorraine D 04/05/2013, 12:01 PM  Addendum: No changes were made to original OT eval. PT attempting to addend their note by accidentally opening OT note. Therefore OT author name was under PT.  Changed author back to OT.  Judithann SaugerStephanie Arelene Moroni OTR/L [phone] 04/06/2013

## 2013-04-05 NOTE — Progress Notes (Signed)
Went into patient's room to give medication and could hear expiratory wheezing and congestion.  Oxygen saturation was checked which was 96%. Patient stated that she "noticed feeling a little more short of breath than usual." Patient also currently receiving NS@125  cc/hr. MD aware of findings who then came to see patient.  A full set of VS taken. BP 193/113 which patient was laying in the bed with MD at the bedside.  Awaiting new orders for BP meds and breathing treatments.  Will continue to monitor.

## 2013-04-05 NOTE — Progress Notes (Signed)
PHYSICAL THERAPY NOTE- PT addended the OCCUPATIONAL THERAPY EVALUATION note below  By error. In process of correcting error.  Blanchard KelchKaren Jayzen Paver PT [phone]

## 2013-04-05 NOTE — Progress Notes (Signed)
[facility] Progress Note    [name] HMC:[id number] DOB: May 29, 1918 DOA: 04/04/2013 PCP: No primary provider on file.  Brief narrative: 80 year old female patient with history of hypertension and dyslipidemia. Presented to the emergency department with complaints of dizziness when waking up earlier in the day. No syncopal episode reported. Patient reported that she tripped and fell to call she was in balance. Later in the day family friend noticed that the patient had garbled speech he kept repeating the same words. No focal neurological deficits otherwise appreciated. CT scan in the ED showed recent infarcts. MRI was pending at time of admission. She was admitted for a TIA workup.  Note this patient had similar symptoms in August of 2014. Prior carotid endarterectomy. In addition she reported some coughing and congestion for several weeks. Remote tobacco use greater than 20 years ago.  Assessment/Plan: Active Problems:   Acute CVA (cerebrovascular accident)-nonhemorrhagic -Since admission RR has revealed acute nonhemorrhagic infarcts in the right caudate region -Neurology/stroke team consulted -Recommendations to discontinue aspirin in favor of Plavix -Bilateral carotid duplex with bilateral nonobstructive stenosis less than 60% -2-D echocardiogram pending -PT/OT/SLP evaluation is pending -Risk factor stratification-check lipid panel and hemoglobin A1c    Dehydration/Orthostatic hypotension -Based on urinalysis term patient may be dehydrated -Orthostatic vital signs obtained and patient's systolic blood pressure dropped into the 60s when standing -IV fluids initiated earlier this morning is 75 cc per hour have been increased to 125 cc per hour -A 500 cc normal saline bolus given -Lopressor ordered at admission discontinued    Abnormal urinalysis -Could be urinary tract infection versus evidence of dehydration -No fever or leukocytosis or symptoms so would not treat  unless urine culture positive    Acute renal failure -Baseline BUN and creatinine from August of 2014 demonstrated BUN 30 and creatinine 1.19 -Presentation BUN 40 and creatinine 2.0 -Suspect combination of ACE inhibitor and diuretic contributed to acute renal failure (early ATN) -unclear if patient had poor oral intake recently -ACE inhibitor with diuretic held at admission    Hypokalemia -Replete and follow lab   DVT prophylaxis: Lovenox Code Status: Full Family Communication: No family at bedside Disposition Plan/Expected LOS: Remain on telemetry until stroke work up completed   Consultants: Neurology  Procedures: 2-D echocardiogram pending  Carotid duplex Preliminary results: Right 40-59% ICA stenosis, left 1-59% ICA stenosis  Antibiotics: None  HPI/Subjective: Patient alert and sitting on the side of the bed. No complaints of dizziness at this time. No chest pain or shortness of breath. No visual disturbances, weakness or numbness of extremity.  Objective: Blood pressure 67/42, pulse 124, temperature 100.1 F (37.8 C), temperature source Oral, resp. rate 22, height 5\' 5"  (1.651 m), weight 172 lb 12.8 oz (78.382 kg), SpO2 93.00%.  Intake/Output Summary (Last 24 hours) at 04/05/13 1327 Last data filed at 04/05/13 1040  Gross per 24 hour  Intake    360 ml  Output    650 ml  Net   -290 ml     Exam: General: No acute respiratory distress Lungs: Clear to auscultation bilaterally without wheezes or crackles, RA Cardiovascular: Regular rate and rhythm without murmur gallop or rub normal S1 and S2, no peripheral edema or JVD Abdomen: Nontender, nondistended, soft, bowel sounds positive, no rebound, no ascites, no appreciable mass Musculoskeletal: No significant cyanosis, clubbing of bilateral lower extremities Neurological: Alert and oriented x 3, moves all extremities x 4 without focal neurological deficits except for subtle upgoing toe on right (noted on neurology PAs  exam while I was also in the room), CN 2-12 grossly intact  Scheduled Meds:  Scheduled Meds: . clopidogrel  75 mg Oral Q breakfast  . colesevelam  1,875 mg Oral BID WC  . enoxaparin (LOVENOX) injection  30 mg Subcutaneous Q24H  . sertraline  50 mg Oral q morning - 10a  . simvastatin  40 mg Oral QHS   Continuous Infusions: . sodium chloride 125 mL/hr at 04/05/13 1100    **Reviewed in detail by the Attending Physician  Data Reviewed: Basic Metabolic Panel:  Recent Labs Lab 04/04/13 1710 04/04/13 1723 04/05/13 0820  NA 132* 134* 134*  K 4.1 3.8 3.6*  CL 92* 96 97  CO2 23  --  22  GLUCOSE 115* 112* 140*  BUN 42* 40* 37*  CREATININE 1.87* 2.00* 1.36*  CALCIUM 8.8  --  8.4   Liver Function Tests:  Recent Labs Lab 04/04/13 1710  AST 33  ALT 21  ALKPHOS 29*  BILITOT 0.6  PROT 7.3  ALBUMIN 3.8   No results found for this basename: LIPASE, AMYLASE,  in the last 168 hours No results found for this basename: AMMONIA,  in the last 168 hours CBC:  Recent Labs Lab 04/04/13 1710 04/04/13 1723 04/05/13 0820  WBC 4.7  --  4.8  NEUTROABS 3.3  --   --   HGB 12.7 13.3 12.1  HCT 36.4 39.0 34.5*  MCV 91.9  --  92.2  PLT 122*  --  103*   Cardiac Enzymes:  Recent Labs Lab 04/04/13 1710  TROPONINI <0.30   BNP (last 3 results) No results found for this basename: PROBNP,  in the last 8760 hours CBG:  Recent Labs Lab 04/04/13 1713 04/04/13 2230 04/05/13 0731 04/05/13 1153  GLUCAP 105* 99 108* 99    No results found for this or any previous visit (from the past 240 hour(s)).   Studies:  Recent x-ray studies have been reviewed in detail by the Attending Physician     Junious SilkAllison Ellis, ANP [facility] Office  [phone] Pager [phone]  **If unable to reach the above provider after paging please contact the Flow Manager @ [phone]  On-Call/Text Page:      [email or web address]      password [email or web address]  If 7PM-7AM, please contact  night-coverage [email or web address] Password [email or web address] 04/05/2013, 1:27 PM   LOS: 1 day

## 2013-04-05 NOTE — Evaluation (Signed)
Physical Therapy Evaluation Patient Details Name: Yvonne [name] MRN: [id number] DOB: Dec 10, 1918 Today's Date: 04/05/2013 Time: 1450-1506 PT Time Calculation (min): 16 min  PT Assessment / Plan / Recommendation History of Present Illness  Yvonne [name] is an 80 y.o. female who was brought to the hospital due to feeling dizzy, imbalance and dysarthria.  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 Follow up MRI shows acute nonhemorrhagic infarcts within the right caudate head and anterior limb internal capsule. Patient was on ASA daily prior to arrival and has been changed to Plavix while in hospital. Pt found w/ orthostatic hypotension this AM and given a bolus of fluid.  Clinical Impression  Pt presents with decreased balance and endurance. Pt will benefit from PT to address problems. Recommend pt have 24/7 caregivers if DC'd to home.     PT Assessment  Patient needs continued PT services    Follow Up Recommendations  Home health PT;Supervision for mobility/OOB    Does the patient have the potential to tolerate intense rehabilitation      Barriers to Discharge Decreased caregiver support      Equipment Recommendations   (tbd)    Recommendations for Other Services     Frequency Min 3X/week    Precautions / Restrictions Precautions Precautions: Fall   Pertinent Vitals/Pain After amb in room BP 165/85      Mobility  Bed Mobility Overal bed mobility: Needs Assistance Bed Mobility: Supine to Sit;Sit to Supine Supine to sit: Min assist Sit to supine: Supervision General bed mobility comments: needed S only and increased time Transfers Overall transfer level: Needs assistance Equipment used: 1 person hand held assist;None (objects) Transfers: Sit to/from Stand General transfer comment: min assist from bed and  toilet with rail Ambulation/Gait Ambulation/Gait assistance: Min assist Ambulation Distance (Feet): 15 Feet (x2) Assistive device: 1 person hand held assist Gait Pattern/deviations: Decreased stride length;Decreased weight shift to right;Decreased weight shift to left;Narrow base of support Gait velocity: decresed , halting. General Gait Details: pt is unsteady withou 1 UE support., pt holding onto door frame, PT hand. cruises between objects Modified Rankin (Stroke Patients Only) Pre-Morbid Rankin Score: No symptoms Modified Rankin: Moderately severe disability    Exercises     PT Diagnosis: Difficulty walking;Generalized weakness  PT Problem List: Decreased activity tolerance;Decreased balance;Decreased mobility;Decreased cognition;Cardiopulmonary status limiting activity;Decreased knowledge of precautions;Decreased safety awareness;Decreased knowledge of use of DME PT Treatment Interventions: DME instruction;Gait training;Stair training;Functional mobility training;Therapeutic activities;Therapeutic exercise;Balance training;Patient/family education     PT Goals(Current goals can be found in the care plan section) Acute Rehab PT Goals Patient Stated Goal: get home and see dog PT Goal Formulation: With patient Time For Goal Achievement: 04/19/13 Potential to Achieve Goals: Good  Visit Information  Last PT Received On: 04/05/13 Assistance Needed: +1 History of Present Illness: Yvonne [name] is an 80 y.o. female who was brought to the hospital due to feeling dizzy, imbalance and dysarthria.  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 Follow up MRI shows acute nonhemorrhagic infarcts within the right caudate head and anterior limb internal capsule. Patient was on ASA daily prior to arrival and has been changed to Plavix while  in hospital  Prior Functioning  Home Living Family/patient expects to be discharged to:: Private residence Living Arrangements: Alone Available Help at Discharge: Friend(s) Type of Home: House Home Access: Stairs to enter Entergy CorporationEntrance Stairs-Number of Steps: 4 Entrance Stairs-Rails: Can reach both Home Layout: One level Home Equipment: Walker - 2 wheels;Shower seat Prior Function Level of Independence: Independent Communication Communication: No difficulties;HOH    Cognition  Cognition Arousal/Alertness: Awake/alert Overall Cognitive Status: Impaired/Different from baseline Area of Impairment: Orientation Orientation Level: Time General Comments: Stated " I don't know what day it is when I am home"    Extremity/Trunk Assessment Upper Extremity Assessment Upper Extremity Assessment: Overall WFL for tasks assessed Lower Extremity Assessment Lower Extremity Assessment: Overall WFL for tasks assessed Cervical / Trunk Assessment Cervical / Trunk Assessment: Normal   Balance Balance Overall balance assessment: Needs assistance Sitting-balance support: No upper extremity supported Sitting balance-Leahy Scale: Good Dynamic Sitting - Comments: abl e to lean forward on toilet for self pericare. Standing balance support: No upper extremity supported Standing balance-Leahy Scale: Fair Standing balance comment: pt did turn around 180 w/ supervision.  End of Session PT - End of Session Activity Tolerance: Patient limited by fatigue Patient left: in bed;with call bell/phone within reach;with bed alarm set Nurse Communication: Mobility status  GP     Rada HayHill, Authur Cubit Elizabeth 04/05/2013, 3:20 PM Blanchard KelchKaren Doye Montilla PT [phone]

## 2013-04-05 NOTE — Evaluation (Signed)
Speech Language Pathology Evaluation Patient Details Name: Yvonne [name] MRN: [id number] DOB: Jan 20, 1919 Today's Date: 04/05/2013 Time: 1500-1550 SLP Time Calculation (min): 50 min  Problem List:  Patient Active Problem List   Diagnosis Date Noted  . Abnormal urinalysis 04/05/2013  . Dehydration 04/05/2013  . Acute renal failure 04/05/2013  . Orthostatic hypotension 04/05/2013  . Hypokalemia 04/05/2013  . Acute CVA (cerebrovascular accident)-nonhemorrhagic 04/04/2013   Past Medical History:  Past Medical History  Diagnosis Date  . Hypertension   . Hypercholesteremia    Past Surgical History:  Past Surgical History  Procedure Laterality Date  . Back surgery     HPI:  80 year old female admitted 04/04/13 due to dizziness, disorientation and slurred speech.  MRI revealed infarct of the right caudate head and anterior limb capsule.     Assessment / Plan / Recommendation Clinical Impression  The Montreal Cognitive Assessment was administered.  Over all score was 21/30.  (Normal is >25/30) Primary area of deficit was noted in immediate and delayed recall, which pt reports has been an increasing problem for a few months.  Pt was unable to tell me the year, day, or date, but reports she does not keep up with this information; refers to the newspaper instead.  It appears that pt's current memory status is at or near baseline.  Discussed concern for her safety with impaired memory, given level of independence.  Pt reports longstanding habit of writing down anything she needs to remember.  Going forward, pt may benefit from more regular supervision.    SLP Assessment  All further Speech Lanaguage Pathology  needs can be addressed in the next venue of care    Follow Up Recommendations   (more frequent supervision, due to decreased recall)    Frequency and Duration        Pertinent Vitals/Pain No pain reported   SLP Goals   n/a  SLP Evaluation Prior Functioning  Type of Home:  House  Lives With: Alone Available Help at Discharge: Friend(s);Family Education: college grad Vocation: Retired   IT consultantCognition  Overall Cognitive Status: History of cognitive impairments - at baseline Arousal/Alertness: Awake/alert Orientation Level: Oriented to person;Oriented to place;Oriented to situation (oriented to month only, not day, date, or year) Comments: MoCA administered. Pt scored 21/30 (n=>25/30)    Comprehension  Auditory Comprehension Overall Auditory Comprehension: Appears within functional limits for tasks assessed Visual Recognition/Discrimination Discrimination: Not tested Reading Comprehension Reading Status: Not tested    Expression Expression Primary Mode of Expression: Verbal Verbal Expression Overall Verbal Expression: Appears within functional limits for tasks assessed Written Expression Dominant Hand: Right   Oral / Motor Oral Motor/Sensory Function Overall Oral Motor/Sensory Function: Appears within functional limits for tasks assessed Motor Speech Overall Motor Speech: Appears within functional limits for tasks assessed   GO    Adilyn Humes B. [place], [facility] Florida At The VillagesMSP, CCC-SLP [phone]  Leigh AuroraBueche, Christpoher Sievers Brown 04/05/2013, 4:03 PM

## 2013-04-05 NOTE — Evaluation (Signed)
Clinical/Bedside Swallow Evaluation Patient Details  Name: Sharyn DrossVirginia K Barcellos MRN: [id number] Date of Birth: [birth year]  Today's Date: 04/05/2013 Time: [id number] SLP Time Calculation (min): 15 min  Past Medical History:  Past Medical History  Diagnosis Date  . Hypertension   . Hypercholesteremia    Past Surgical History:  Past Surgical History  Procedure Laterality Date  . Back surgery     HPI:  80 year old female admitted 04/04/13 due to dizziness, disorientation and slurred speech.  MRI revealed infarct of the right caudate head and anterior limb capsule.     Assessment / Plan / Recommendation Clinical Impression  Pt does not exhibit any overt s/s aspiration.  All consistencies were tolerated without difficulty.  No report of dysphagia prior to admit.    Aspiration Risk  Mild    Diet Recommendation Regular;Thin liquid   Liquid Administration via: Cup;Straw Medication Administration: Whole meds with liquid Supervision: Patient able to self feed Compensations: Slow rate;Small sips/bites Postural Changes and/or Swallow Maneuvers: Seated upright 90 degrees    Other  Recommendations Oral Care Recommendations: Oral care BID   Follow Up Recommendations  None    Frequency and Duration        Pertinent Vitals/Pain No pain reported    SLP Swallow Goals  n/a   Swallow Study Prior Functional Status  Type of Home: House  Lives With: Alone Available Help at Discharge: Friend(s);Family Education: college grad Vocation: Retired    General Date of Onset: 04/04/13 HPI: 80 year old female admitted 04/04/13 due to dizziness, disorientation and slurred speech.  MRI revealed infarct of the right caudate head and anterior limb capsule.   Type of Study: Bedside swallow evaluation Diet Prior to this Study: Regular;Thin liquids Temperature Spikes Noted: No Respiratory Status: Room air History of Recent Intubation: No Behavior/Cognition: Cooperative;Hard of hearing;Alert;Pleasant  mood Oral Cavity - Dentition: Adequate natural dentition Self-Feeding Abilities: Able to feed self Patient Positioning: Upright in bed Baseline Vocal Quality: Clear Volitional Cough: Strong Volitional Swallow: Able to elicit    Oral/Motor/Sensory Function Overall Oral Motor/Sensory Function: Appears within functional limits for tasks assessed   Ice Chips Ice chips: Not tested   Thin Liquid Thin Liquid: Within functional limits Presentation: Self Fed;Straw    Nectar Thick Nectar Thick Liquid: Not tested   Honey Thick Honey Thick Liquid: Not tested   Puree Puree: Within functional limits Presentation: Self Fed;Spoon   Solid   GO    Solid: Within functional limits Presentation: Self Fed      Yachet Mattson B. [place], MSP, CCC-SLP [phone]  Leigh AuroraBueche, Miguelangel Korn Brown 04/05/2013,4:09 PM

## 2013-04-05 NOTE — Progress Notes (Signed)
VASCULAR LAB PRELIMINARY  PRELIMINARY  PRELIMINARY  PRELIMINARY  Carotid duplex completed.    Preliminary report:  Right - 40% to 59% ICA stenosis lower end of range. Unable to evaluate the vertebral due to respiratory interference. Left - 1% to 59% ICA stenosis upper end of range. Vertebral artery flow is antegrade.  Mistey Hoffert, Yaniah, RVS- 04/05/2013, 12:51 PM

## 2013-04-05 NOTE — Consult Note (Signed)
Referring Physician: TAT    Chief Complaint: stroke  HPI:                                                                                                                                         [place] is an 80 y.o. female who was brought to the hospital due to feeling dizzy, imbalance and dysarthria.  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 Follow up MRI shows acute nonhemorrhagic infarcts within the right caudate head and anterior limb internal capsule.  Patient was on ASA daily prior to arrival and has been changed to Plavix while in hospital. Neurology was consulted for new CVA.   Date last known well: Date: 04/04/2013 Time last known well: Unable to determine tPA Given: No: out of window  Past Medical History  Diagnosis Date  . Hypertension   . Hypercholesteremia     Past Surgical History  Procedure Laterality Date  . Back surgery      Family History  Problem Relation Age of Onset  . Hypertension Mother   . Hypertension Father    Social History:  reports that she quit smoking about 30 years ago. She has never used smokeless tobacco. She reports that she does not drink alcohol or use illicit drugs.  Allergies: No Known Allergies  Medications:                                                                                                                           Scheduled: . sodium chloride   Intravenous STAT  . clopidogrel  75 mg Oral Q breakfast  . colesevelam  1,875 mg Oral BID WC  . enoxaparin (LOVENOX) injection  30 mg Subcutaneous Q24H  . metoprolol  12.5 mg Oral BID  . sertraline  50 mg Oral q morning - 10a  . simvastatin  40 mg Oral QHS    ROS:  History  obtained from family  General ROS: negative for - chills, fatigue, fever, night sweats, weight gain or weight loss Psychological ROS: negative for - behavioral disorder, hallucinations, memory difficulties, mood swings or suicidal ideation Ophthalmic ROS: negative for - blurry vision, double vision, eye pain or loss of vision ENT ROS: negative for - epistaxis, nasal discharge, oral lesions, sore throat, tinnitus or vertigo Allergy and Immunology ROS: negative for - hives or itchy/watery eyes Hematological and Lymphatic ROS: negative for - bleeding problems, bruising or swollen lymph nodes Endocrine ROS: negative for - galactorrhea, hair pattern changes, polydipsia/polyuria or temperature intolerance Respiratory ROS: negative for - cough, hemoptysis, shortness of breath or wheezing Cardiovascular ROS: negative for - chest pain, dyspnea on exertion, edema or irregular heartbeat Gastrointestinal ROS: negative for - abdominal pain, diarrhea, hematemesis, nausea/vomiting or stool incontinence Genito-Urinary ROS: negative for - dysuria, hematuria, incontinence or urinary frequency/urgency Musculoskeletal ROS: negative for - joint swelling or muscular weakness Neurological ROS: as noted in HPI Dermatological ROS: negative for rash and skin lesion changes  Neurologic Examination:                                                                                                      Blood pressure 158/96, pulse 76, temperature 100.1 F (37.8 C), temperature source Oral, resp. rate 22, height _0  (1.651 m), weight 78.382 kg (172 lb 12.8 oz), SpO2 93.00%.  Mental Status: Alert, oriented, thought content appropriate.  Speech fluent without evidence of aphasia.  Able to follow 3 step commands without difficulty. Cranial Nerves: II: Discs flat bilaterally; Visual fields grossly normal, pupils equal, round, reactive to light and accommodation III,IV, VI: ptosis not present, extra-ocular motions intact  bilaterally V,VII: smile symmetric, facial light touch sensation normal bilaterally VIII: hearing normal bilaterally IX,X: gag reflex present XI: bilateral shoulder shrug XII: midline tongue extension without atrophy or fasciculations  Motor: Right : Upper extremity   5/5    Left:     Upper extremity   5/5  Lower extremity   5/5     Lower extremity   5/5 Tone and bulk:normal tone throughout; no atrophy noted Sensory: Pinprick and light touch intact throughout, bilaterally Deep Tendon Reflexes:  Right: Upper Extremity   Left: Upper extremity   biceps (C-5 to C-6) 2/4   biceps (C-5 to C-6) 2/4 tricep (C7) 2/4    triceps (C7) 2/4 Brachioradialis (C6) 2/4  Brachioradialis (C6) 2/4  Lower Extremity Lower Extremity  quadriceps (L-2 to L-4) 2/4   quadriceps (L-2 to L-4) 2/4 Achilles (S1) 1/4   Achilles (S1) 1/4  Plantars: Right: downgoing   Left: upgoing Cerebellar: normal finger-to-nose,  normal heel-to-shin test Gait: off balance due to dizziness and BP was obtained showing a drop from systolic 625 to 69 CV: pulses palpable throughout    Results for orders placed during the hospital encounter of 04/04/13 (from the past 48 hour(s))  ETHANOL     Status: None   Collection Time    04/04/13  5:10 PM      Result Value Range   Alcohol,  Ethyl (B) <11  0 - 11 mg/dL   Comment:            LOWEST DETECTABLE LIMIT FOR     SERUM ALCOHOL IS 11 mg/dL     FOR MEDICAL PURPOSES ONLY  CBC     Status: Abnormal   Collection Time    04/04/13  5:10 PM      Result Value Range   WBC 4.7  4.0 - 10.5 K/uL   RBC 3.96  3.87 - 5.11 MIL/uL   Hemoglobin 12.7  12.0 - 15.0 g/dL   HCT 36.4  36.0 - 46.0 %   MCV 91.9  78.0 - 100.0 fL   MCH 32.1  26.0 - 34.0 pg   MCHC 34.9  30.0 - 36.0 g/dL   RDW 13.0  11.5 - 15.5 %   Platelets 122 (*) 150 - 400 K/uL  DIFFERENTIAL     Status: None   Collection Time    04/04/13  5:10 PM      Result Value Range   Neutrophils Relative % 71  43 - 77 %   Neutro Abs 3.3   1.7 - 7.7 K/uL   Lymphocytes Relative 19  12 - 46 %   Lymphs Abs 0.9  0.7 - 4.0 K/uL   Monocytes Relative 10  3 - 12 %   Monocytes Absolute 0.5  0.1 - 1.0 K/uL   Eosinophils Relative 0  0 - 5 %   Eosinophils Absolute 0.0  0.0 - 0.7 K/uL   Basophils Relative 0  0 - 1 %   Basophils Absolute 0.0  0.0 - 0.1 K/uL  COMPREHENSIVE METABOLIC PANEL     Status: Abnormal   Collection Time    04/04/13  5:10 PM      Result Value Range   Sodium 132 (*) 137 - 147 mEq/L   Potassium 4.1  3.7 - 5.3 mEq/L   Chloride 92 (*) 96 - 112 mEq/L   CO2 23  19 - 32 mEq/L   Glucose, Bld 115 (*) 70 - 99 mg/dL   BUN 42 (*) 6 - 23 mg/dL   Creatinine, Ser 1.87 (*) 0.50 - 1.10 mg/dL   Calcium 8.8  8.4 - 10.5 mg/dL   Total Protein 7.3  6.0 - 8.3 g/dL   Albumin 3.8  3.5 - 5.2 g/dL   AST 33  0 - 37 U/L   ALT 21  0 - 35 U/L   Alkaline Phosphatase 29 (*) 39 - 117 U/L   Total Bilirubin 0.6  0.3 - 1.2 mg/dL   GFR calc non Af Amer 22 (*) >90 mL/min   GFR calc Af Amer 26 (*) >90 mL/min   Comment: (NOTE)     The eGFR has been calculated using the CKD EPI equation.     This calculation has not been validated in all clinical situations.     eGFR's persistently <90 mL/min signify possible Chronic Kidney     Disease.  TROPONIN I     Status: None   Collection Time    04/04/13  5:10 PM      Result Value Range   Troponin I <0.30  <0.30 ng/mL   Comment:            Due to the release kinetics of cTnI,     a negative result within the first hours     of the onset of symptoms does not rule out     myocardial infarction with certainty.  If myocardial infarction is still suspected,     repeat the test at appropriate intervals.  HEMOGLOBIN A1C     Status: Abnormal   Collection Time    04/04/13  5:10 PM      Result Value Range   Hemoglobin A1C 5.9 (*) <5.7 %   Comment: (NOTE)                                                                               According to the ADA Clinical Practice Recommendations for 2011, when      HbA1c is used as a screening test:      >=6.5%   Diagnostic of Diabetes Mellitus               (if abnormal result is confirmed)     5.7-6.4%   Increased risk of developing Diabetes Mellitus     References:Diagnosis and Classification of Diabetes Mellitus,Diabetes     VQMG,8676,19(JKDTO 1):S62-S69 and Standards of Medical Care in             Diabetes - 2011,Diabetes Care,2011,34 (Suppl 1):S11-S61.   Mean Plasma Glucose 123 (*) <117 mg/dL   Comment: Performed at [place], [facility]     Status: Abnormal   Collection Time    04/04/13  5:13 PM      Result Value Range   Glucose-Capillary 105 (*) 70 - 99 mg/dL  POCT I-STAT TROPONIN I     Status: None   Collection Time    04/04/13  5:17 PM      Result Value Range   Troponin i, poc 0.02  0.00 - 0.08 ng/mL   Comment 3            Comment: Due to the release kinetics of cTnI,     a negative result within the first hours     of the onset of symptoms does not rule out     myocardial infarction with certainty.     If myocardial infarction is still suspected,     repeat the test at appropriate intervals.  CG4 I-STAT (LACTIC ACID)     Status: None   Collection Time    04/04/13  5:21 PM      Result Value Range   Lactic Acid, Venous 1.00  0.5 - 2.2 mmol/L  POCT I-STAT, CHEM 8     Status: Abnormal   Collection Time    04/04/13  5:23 PM      Result Value Range   Sodium 134 (*) 137 - 147 mEq/L   Potassium 3.8  3.7 - 5.3 mEq/L   Chloride 96  96 - 112 mEq/L   BUN 40 (*) 6 - 23 mg/dL   Creatinine, Ser 2.00 (*) 0.50 - 1.10 mg/dL   Glucose, Bld 112 (*) 70 - 99 mg/dL   Calcium, Ion 1.13  1.13 - 1.30 mmol/L   TCO2 25  0 - 100 mmol/L   Hemoglobin 13.3  12.0 - 15.0 g/dL   HCT 39.0  36.0 - 46.0 %  GLUCOSE, [facility]     Status: None   Collection Time    04/04/13 10:30 PM      Result Value  Range   Glucose-Capillary 99  70 - 99 mg/dL   Comment 1 Documented in Chart     Comment 2 Notify RN    URINE RAPID DRUG SCREEN (HOSP  PERFORMED)     Status: None   Collection Time    04/05/13  3:30 AM      Result Value Range   Opiates NONE DETECTED  NONE DETECTED   Cocaine NONE DETECTED  NONE DETECTED   Benzodiazepines NONE DETECTED  NONE DETECTED   Amphetamines NONE DETECTED  NONE DETECTED   Tetrahydrocannabinol NONE DETECTED  NONE DETECTED   Barbiturates NONE DETECTED  NONE DETECTED   Comment:            DRUG SCREEN FOR MEDICAL PURPOSES     ONLY.  IF CONFIRMATION IS NEEDED     FOR ANY PURPOSE, NOTIFY LAB     WITHIN 5 DAYS.                LOWEST DETECTABLE LIMITS     FOR URINE DRUG SCREEN     Drug Class       Cutoff (ng/mL)     Amphetamine      1000     Barbiturate      200     Benzodiazepine   030     Tricyclics       092     Opiates          300     Cocaine          300     THC              50  URINALYSIS, ROUTINE W REFLEX MICROSCOPIC     Status: Abnormal   Collection Time    04/05/13  3:30 AM      Result Value Range   Color, Urine YELLOW  YELLOW   APPearance CLOUDY (*) CLEAR   Specific Gravity, Urine 1.013  1.005 - 1.030   pH 5.0  5.0 - 8.0   Glucose, UA NEGATIVE  NEGATIVE mg/dL   Hgb urine dipstick NEGATIVE  NEGATIVE   Bilirubin Urine NEGATIVE  NEGATIVE   Ketones, ur NEGATIVE  NEGATIVE mg/dL   Protein, ur NEGATIVE  NEGATIVE mg/dL   Urobilinogen, UA 0.2  0.0 - 1.0 mg/dL   Nitrite NEGATIVE  NEGATIVE   Leukocytes, UA MODERATE (*) NEGATIVE  URINE MICROSCOPIC-ADD ON     Status: Abnormal   Collection Time    04/05/13  3:30 AM      Result Value Range   Squamous Epithelial / LPF FEW (*) RARE   WBC, UA 11-20  <3 WBC/hpf   Bacteria, UA RARE  RARE  LIPID PANEL     Status: Abnormal   Collection Time    04/05/13  4:15 AM      Result Value Range   Cholesterol 140  0 - 200 mg/dL   Triglycerides 152 (*) <150 mg/dL   HDL 40  >39 mg/dL   Total CHOL/HDL Ratio 3.5     VLDL 30  0 - 40 mg/dL   LDL Cholesterol 70  0 - 99 mg/dL   Comment:            Total Cholesterol/HDL:CHD Risk     Coronary Heart Disease  Risk Table                         Men   Women      1/2  Average Risk   3.4   3.3      Average Risk       5.0   4.4      2 X Average Risk   9.6   7.1      3 X Average Risk  23.4   11.0                Use the calculated Patient Ratio     above and the CHD Risk Table     to determine the patient's CHD Risk.                ATP III CLASSIFICATION (LDL):      <100     mg/dL   Optimal      100-129  mg/dL   Near or Above                        Optimal      130-159  mg/dL   Borderline      160-189  mg/dL   High      >190     mg/dL   Very High     Performed at [facility]  APTT     Status: None   Collection Time    04/05/13  4:15 AM      Result Value Range   aPTT 32  24 - 37 seconds  PROTIME-INR     Status: None   Collection Time    04/05/13  4:15 AM      Result Value Range   Prothrombin Time 13.0  11.6 - 15.2 seconds   INR 1.00  0.00 - 1.49  GLUCOSE, [facility]     Status: Abnormal   Collection Time    04/05/13  7:31 AM      Result Value Range   Glucose-Capillary 108 (*) 70 - 99 mg/dL  BASIC METABOLIC PANEL     Status: Abnormal   Collection Time    04/05/13  8:20 AM      Result Value Range   Sodium 134 (*) 137 - 147 mEq/L   Potassium 3.6 (*) 3.7 - 5.3 mEq/L   Chloride 97  96 - 112 mEq/L   CO2 22  19 - 32 mEq/L   Glucose, Bld 140 (*) 70 - 99 mg/dL   BUN 37 (*) 6 - 23 mg/dL   Creatinine, Ser 1.36 (*) 0.50 - 1.10 mg/dL   Comment: REPEATED TO VERIFY   Calcium 8.4  8.4 - 10.5 mg/dL   GFR calc non Af Amer 33 (*) >90 mL/min   GFR calc Af Amer 38 (*) >90 mL/min   Comment: (NOTE)     The eGFR has been calculated using the CKD EPI equation.     This calculation has not been validated in all clinical situations.     eGFR's persistently <90 mL/min signify possible Chronic Kidney     Disease.  CBC     Status: Abnormal   Collection Time    04/05/13  8:20 AM      Result Value Range   WBC 4.8  4.0 - 10.5 K/uL   RBC 3.74 (*) 3.87 - 5.11 MIL/uL   Hemoglobin 12.1  12.0 - 15.0  g/dL   HCT 34.5 (*) 36.0 - 46.0 %   MCV 92.2  78.0 - 100.0 fL   MCH 32.4  26.0 - 34.0 pg   MCHC 35.1  30.0 - 36.0 g/dL  RDW 13.1  11.5 - 15.5 %   Platelets 103 (*) 150 - 400 K/uL   Comment: SPECIMEN CHECKED FOR CLOTS     REPEATED TO VERIFY     PLATELET COUNT CONFIRMED BY SMEAR   Ct Head Wo Contrast  04/04/2013   CLINICAL DATA:  Altered mental status  EXAM: CT HEAD WITHOUT CONTRAST  TECHNIQUE: Contiguous axial images were obtained from the base of the skull through the vertex without intravenous contrast. Study was obtained within 24 hr of patient's arrival at the emergency department.  COMPARISON:  Brain CT and brain MRI November 01, 2012  FINDINGS: Mild diffuse atrophy is stable. There is no mass effect, hemorrhage, extra-axial fluid collection, or midline shift.  There is a new area of decreased attenuation in the right globus pallidum and anterior limb of the right internal capsule. There is also new decreased attenuation in the head of the caudate nucleus on the right. There is an old prior infarct in the medial left pons. There is also evidence of prior small infarct in the left thalamus, better seen on prior MR. There is mild periventricular small vessel disease.  Calcification in the mid cerebellar hemispheres bilaterally is stable and felt to be physiologic. There is also mild basal ganglia calcification, felt to be physiologic.  Bony calvarium appears intact.  The mastoid air cells are clear.  IMPRESSION: Evidence of recent infarcts in the head of the caudate nucleus on the right with extension into the anterior limb of the right internal capsule and medial right globus pallidum.  Prior small infarcts as noted above. There is mild atrophy with periventricular small vessel disease. Basal ganglia and cerebellar calcifications are stable and felt to most likely be physiologic in etiology.   Electronically Signed   By: Lowella Grip M.D.   On: 04/04/2013 17:44   Mr Jodene Nam Head Wo  Contrast  04/04/2013   CLINICAL DATA:  Slurred speech, disorientation, and confusion beginning this afternoon. Question stroke.  EXAM: MRI HEAD WITHOUT CONTRAST  MRA HEAD WITHOUT CONTRAST  TECHNIQUE: Multiplanar, multiecho pulse sequences of the brain and surrounding structures were obtained without intravenous contrast. Angiographic images of the head were obtained using MRA technique without contrast.  COMPARISON:  CT head without contrast 04/04/2013. MRI brain 11/01/2012.  FINDINGS: MRI HEAD FINDINGS  Acute nonhemorrhagic infarcts are confirmed in the right caudate head and anterior limb internal capsule. No additional infarcts are present.  T2 signal hyperintensity is associated with the infarcts. Moderate atrophy and periventricular white matter disease is otherwise stable. The ventricles are proportionate to the degree of atrophy. No significant extra-axial fluid collection is present. Flow is present in the major intracranial arteries. The patient is status post bilateral lens extractions. Mild mucosal thickening is noted in the inferior frontal sinuses, the anterior ethmoid air cells, the inferior maxillary sinuses and throughout the sphenoid sinuses. The mastoid air cells are clear.  MRA HEAD FINDINGS  The time-of-flight images a performed twice. The images are degraded by patient motion.  The internal carotid arteries demonstrate mild irregularity in the cavernous segments bilaterally, right greater than left. There is no definite stenosis greater than 50%. The right A1 segment is aplastic. Mild to moderate right irregularity is present in the left A1 segment. Both A2 segments are visualized although the anterior communicating artery is incompletely seen. The right MCA bifurcation is intact. The superior division is occluded proximally. A left MCA bifurcation is intact. Moderate small vessel disease is evident.  The right vertebral artery  is slightly dominant to the left. The PICA origins are just below  the field of view. The basilar artery is within normal limits. Both posterior cerebral arteries originate from the basilar tip. There is moderate attenuation of branch vessels.  IMPRESSION: 1. Acute nonhemorrhagic infarcts within the right caudate head and anterior limb internal capsule are confirmed. 2. Moderate generalized atrophy and mild periventricular white matter disease likely reflects the sequela of chronic microvascular ischemia. 3. Occluded vessel versus high-grade stenosis superior right M2 division is distal to the areas of infarct. 4. Atherosclerotic changes within the cavernous carotid arteries bilaterally. 5. Mild to moderate atherosclerotic changes are present within the left A1 segment, the primary source of the anterior circulation. 6. Small vessel disease is likely exaggerated by patient motion.   Electronically Signed   By: Lawrence Santiago M.D.   On: 04/04/2013 21:05   Mr Brain Wo Contrast  04/04/2013   CLINICAL DATA:  Slurred speech, disorientation, and confusion beginning this afternoon. Question stroke.  EXAM: MRI HEAD WITHOUT CONTRAST  MRA HEAD WITHOUT CONTRAST  TECHNIQUE: Multiplanar, multiecho pulse sequences of the brain and surrounding structures were obtained without intravenous contrast. Angiographic images of the head were obtained using MRA technique without contrast.  COMPARISON:  CT head without contrast 04/04/2013. MRI brain 11/01/2012.  FINDINGS: MRI HEAD FINDINGS  Acute nonhemorrhagic infarcts are confirmed in the right caudate head and anterior limb internal capsule. No additional infarcts are present.  T2 signal hyperintensity is associated with the infarcts. Moderate atrophy and periventricular white matter disease is otherwise stable. The ventricles are proportionate to the degree of atrophy. No significant extra-axial fluid collection is present. Flow is present in the major intracranial arteries. The patient is status post bilateral lens extractions. Mild mucosal  thickening is noted in the inferior frontal sinuses, the anterior ethmoid air cells, the inferior maxillary sinuses and throughout the sphenoid sinuses. The mastoid air cells are clear.  MRA HEAD FINDINGS  The time-of-flight images a performed twice. The images are degraded by patient motion.  The internal carotid arteries demonstrate mild irregularity in the cavernous segments bilaterally, right greater than left. There is no definite stenosis greater than 50%. The right A1 segment is aplastic. Mild to moderate right irregularity is present in the left A1 segment. Both A2 segments are visualized although the anterior communicating artery is incompletely seen. The right MCA bifurcation is intact. The superior division is occluded proximally. A left MCA bifurcation is intact. Moderate small vessel disease is evident.  The right vertebral artery is slightly dominant to the left. The PICA origins are just below the field of view. The basilar artery is within normal limits. Both posterior cerebral arteries originate from the basilar tip. There is moderate attenuation of branch vessels.  IMPRESSION: 1. Acute nonhemorrhagic infarcts within the right caudate head and anterior limb internal capsule are confirmed. 2. Moderate generalized atrophy and mild periventricular white matter disease likely reflects the sequela of chronic microvascular ischemia. 3. Occluded vessel versus high-grade stenosis superior right M2 division is distal to the areas of infarct. 4. Atherosclerotic changes within the cavernous carotid arteries bilaterally. 5. Mild to moderate atherosclerotic changes are present within the left A1 segment, the primary source of the anterior circulation. 6. Small vessel disease is likely exaggerated by patient motion.   Electronically Signed   By: Lawrence Santiago M.D.   On: 04/04/2013 21:05    Assessment and plan discussed with with attending physician and they are in agreement.    Shanon Brow  [facility] [phone]  04/05/2013, 10:06 AM   Assessment: 80 y.o. female with acute right caudate head and internal capsule CVA.  While admitted patient showed to be dehydrated and have positive orthostatic vital of 132/52 (lying)--111/40 (sitting)--67/42 (standing)in addition to possible UTI    Stroke Risk Factors - hyperlipidemia and hypertension  Recommend: 1) Carotid doppler (pending) 2) 2 D echo (pending) 3) Continue Plavix 4) hydrate, consider compression stockings  I personally participate in this patient's evaluation and management, including formulating the above clinical impression and management recommendations.   Rush Farmer M.D. [facility] [phone]

## 2013-04-05 NOTE — Progress Notes (Signed)
  Echocardiogram 2D Echocardiogram has been performed.  Verania Salberg 04/05/2013, 2:15 PM

## 2013-04-05 NOTE — Progress Notes (Signed)
Called by RN for "congestion" Went to see patient. She denies any chest discomfort, worsening shortness breath, nausea, vomiting, dizziness. She complains of nonproductive cough. The patient was not in any distress VS  HR [id number]  100% on RA CV RRR Lung--scattered rhonchi with basilar wheeze. Good air movement Abd--soft/NT+BS Ext--no edema, no cyanosis  CXR--COPD changes without infiltrate or edema  Decreased fluid to 50cc/hr Started duonebs every 6 hrs  DTat

## 2013-04-05 NOTE — Plan of Care (Addendum)
MRI reviewed and demonstrates acute non hemorrhagic infarcts in right caudate head and anterior limb capsule. MRA reveals occluded vessel vs high grade stenosis superior M2 division distal to the areas of infarct. In addition pt has ARF and appears dehydrated based on review of UA and prior renal function. Repeat BMET now. Will begin gentle IVFs. UA also c/w UTI so will obtain cx-no fever or leukocytosis so no empiric anbx's. Consulted stroke team-spoke with Dr. Roseanne RenoStewart- advised to dc ASA in favor of Plavix.   Junious SilkAllison Ellis, ANP [phone]

## 2013-04-05 NOTE — Progress Notes (Signed)
UR completed. Patient changed to inpatient r/t +CVA on MRI

## 2013-04-06 DIAGNOSIS — I480 Paroxysmal atrial fibrillation: Secondary | ICD-10-CM | POA: Insufficient documentation

## 2013-04-06 DIAGNOSIS — I4891 Unspecified atrial fibrillation: Secondary | ICD-10-CM

## 2013-04-06 LAB — CBC
HCT: 35 % — ABNORMAL LOW (ref 36.0–46.0)
HEMOGLOBIN: 12.1 g/dL (ref 12.0–15.0)
MCH: 31.8 pg (ref 26.0–34.0)
MCHC: 34.6 g/dL (ref 30.0–36.0)
MCV: 92.1 fL (ref 78.0–100.0)
Platelets: 128 10*3/uL — ABNORMAL LOW (ref 150–400)
RBC: 3.8 MIL/uL — AB (ref 3.87–5.11)
RDW: 13.1 % (ref 11.5–15.5)
WBC: 6.2 10*3/uL (ref 4.0–10.5)

## 2013-04-06 LAB — URINE CULTURE
Colony Count: NO GROWTH
Culture: NO GROWTH
SPECIAL REQUESTS: NORMAL

## 2013-04-06 LAB — BASIC METABOLIC PANEL
BUN: 28 mg/dL — ABNORMAL HIGH (ref 6–23)
CHLORIDE: 100 meq/L (ref 96–112)
CO2: 21 meq/L (ref 19–32)
Calcium: 8.5 mg/dL (ref 8.4–10.5)
Creatinine, Ser: 1.13 mg/dL — ABNORMAL HIGH (ref 0.50–1.10)
GFR calc Af Amer: 47 mL/min — ABNORMAL LOW (ref >90)
GFR calc non Af Amer: 41 mL/min — ABNORMAL LOW (ref >90)
Glucose, Bld: 116 mg/dL — ABNORMAL HIGH (ref 70–99)
POTASSIUM: 3.9 meq/L (ref 3.7–5.3)
Sodium: 138 mEq/L (ref 137–147)

## 2013-04-06 LAB — GLUCOSE, CAPILLARY
GLUCOSE-CAPILLARY: 130 mg/dL — AB (ref 70–99)
Glucose-Capillary: 89 mg/dL (ref 70–99)

## 2013-04-06 MED ORDER — COUMADIN BOOK
Freq: Once | Status: DC
  Filled 2013-04-06: qty 1

## 2013-04-06 MED ORDER — DILTIAZEM HCL 30 MG PO TABS
30.0000 mg | ORAL_TABLET | Freq: Four times a day (QID) | ORAL | Status: DC
  Administered 2013-04-06 – 2013-04-07 (×4): 30 mg via ORAL
  Filled 2013-04-06 (×7): qty 1

## 2013-04-06 MED ORDER — ATORVASTATIN CALCIUM 20 MG PO TABS
20.0000 mg | ORAL_TABLET | Freq: Every day | ORAL | Status: DC
  Administered 2013-04-06 – 2013-04-08 (×3): 20 mg via ORAL
  Filled 2013-04-06 (×4): qty 1

## 2013-04-06 MED ORDER — WARFARIN - PHARMACIST DOSING INPATIENT: Freq: Every day | Status: DC

## 2013-04-06 MED ORDER — WARFARIN SODIUM 2 MG PO TABS
2.0000 mg | ORAL_TABLET | Freq: Once | ORAL | Status: DC
  Filled 2013-04-06: qty 1

## 2013-04-06 NOTE — Progress Notes (Signed)
[facility] PROGRESS NOTE  Yvonne [name] ZOX:[id number] DOB: May 26, 1918 DOA: 04/04/2013 PCP: No primary provider on file.  Assessment/Plan: Acute CVA (cerebrovascular accident)-nonhemorrhagic  -MR brain has revealed acute nonhemorrhagic infarcts in the right caudate region  -Neurology/stroke team appreciated -Recommendations to discontinue aspirin in favor of Plavix  -Patient developed atrial fibrillation 04/06/2013 -Bilateral carotid duplex with bilateral nonobstructive stenosis less than 60%  -2-D echocardiogram EF 55-60%, grade 1 diastolic dysfunction, mod MR -PT/OT evaluation--recommended CIR -LDL 70---> continue current dose of statin  -hemoglobin A1c--5.9  Atrial fibrillation -Suspect that the patient may have had atrial fibrillation prior to this hospitalization -Heart rate was in the low 100s -Start diltiazem 30 mg every 6 hours -CHADSVASc= 6 -I discussed the risks, benefits, and alternatives of anticoagulation with warfarin--the patient expressed understanding and agreed to start warfarin -Lovenox bridge started -case discussed with Dr. Roseanne Reno prior to starting warfarin Dehydration/Orthostatic hypotension  -Improved with IV fluids -Orthostatic vital signs obtained and patient's systolic blood pressure dropped into the 60s when standing  -IV fluids initiated earlier this morning is 75 cc per hour have been increased to 125 cc per hour  -A 500 cc normal saline bolus given  -Lopressor ordered at admission discontinued  Abnormal urinalysis  -Could be urinary tract infection versus evidence of dehydration  -No fever or leukocytosis or symptoms so would not treat unless urine culture positive  Acute renal failure  Improved with IV fluids- -Baseline BUN and creatinine from August of 2014 demonstrated BUN 30 and creatinine 1.19  -Presentation BUN 40 and creatinine 2.0  -Suspect combination of ACE inhibitor and diuretic contributed to acute renal failure (early ATN)  -unclear if patient had poor oral intake recently  -ACE inhibitor with diuretic held at admission  Hypokalemia  -Replete and follow lab   Family Communication:   Pt caregivers at bedside--total time 65 min Disposition Plan:   CIR vs SNF       Procedures/Studies: Dg Chest 2 View  04/05/2013   CLINICAL DATA:  Cough, hypertension, hypercholesterolemia  EXAM: CHEST  2 VIEW  COMPARISON:  12/24/2010  FINDINGS: Enlargement of cardiac silhouette.  Calcified tortuous aorta.  Pulmonary vascularity normal.  Lungs are emphysematous but clear.  No pleural effusion or pneumothorax.  Posttraumatic deformities of multiple lateral mid right ribs.  Mild osseous demineralization.  Surgical clips in right cervical region.  IMPRESSION: Enlargement of cardiac silhouette.  Probable emphysematous changes.   Electronically Signed   By: Ulyses Southward M.D.   On: 04/05/2013 16:53   Ct Head Wo Contrast  04/04/2013   CLINICAL DATA:  Altered mental status  EXAM: CT HEAD WITHOUT CONTRAST  TECHNIQUE: Contiguous axial images were obtained from the base of the skull through the vertex without intravenous contrast. Study was obtained within 24 hr of patient's arrival at the emergency department.  COMPARISON:  Brain CT and brain MRI November 01, 2012  FINDINGS: Mild diffuse atrophy is stable. There is no mass effect, hemorrhage, extra-axial fluid collection, or midline shift.  There is a new area of decreased attenuation in the right globus pallidum and anterior limb of the right internal capsule. There is also new decreased attenuation in the head of the caudate nucleus on the right. There is an old prior infarct in the medial left pons. There is also evidence of prior small infarct in the left thalamus, better seen on prior MR. There is mild periventricular small vessel disease.  Calcification in the mid cerebellar hemispheres bilaterally is stable and felt to be  physiologic. There is also mild basal ganglia calcification, felt to be  physiologic.  Bony calvarium appears intact.  The mastoid air cells are clear.  IMPRESSION: Evidence of recent infarcts in the head of the caudate nucleus on the right with extension into the anterior limb of the right internal capsule and medial right globus pallidum.  Prior small infarcts as noted above. There is mild atrophy with periventricular small vessel disease. Basal ganglia and cerebellar calcifications are stable and felt to most likely be physiologic in etiology.   Electronically Signed   By: Bretta BangWilliam  Woodruff M.D.   On: 04/04/2013 17:44   Mr Maxine GlennMra Head Wo Contrast  04/04/2013   CLINICAL DATA:  Slurred speech, disorientation, and confusion beginning this afternoon. Question stroke.  EXAM: MRI HEAD WITHOUT CONTRAST  MRA HEAD WITHOUT CONTRAST  TECHNIQUE: Multiplanar, multiecho pulse sequences of the brain and surrounding structures were obtained without intravenous contrast. Angiographic images of the head were obtained using MRA technique without contrast.  COMPARISON:  CT head without contrast 04/04/2013. MRI brain 11/01/2012.  FINDINGS: MRI HEAD FINDINGS  Acute nonhemorrhagic infarcts are confirmed in the right caudate head and anterior limb internal capsule. No additional infarcts are present.  T2 signal hyperintensity is associated with the infarcts. Moderate atrophy and periventricular white matter disease is otherwise stable. The ventricles are proportionate to the degree of atrophy. No significant extra-axial fluid collection is present. Flow is present in the major intracranial arteries. The patient is status post bilateral lens extractions. Mild mucosal thickening is noted in the inferior frontal sinuses, the anterior ethmoid air cells, the inferior maxillary sinuses and throughout the sphenoid sinuses. The mastoid air cells are clear.  MRA HEAD FINDINGS  The time-of-flight images a performed twice. The images are degraded by patient motion.  The internal carotid arteries demonstrate mild  irregularity in the cavernous segments bilaterally, right greater than left. There is no definite stenosis greater than 50%. The right A1 segment is aplastic. Mild to moderate right irregularity is present in the left A1 segment. Both A2 segments are visualized although the anterior communicating artery is incompletely seen. The right MCA bifurcation is intact. The superior division is occluded proximally. A left MCA bifurcation is intact. Moderate small vessel disease is evident.  The right vertebral artery is slightly dominant to the left. The PICA origins are just below the field of view. The basilar artery is within normal limits. Both posterior cerebral arteries originate from the basilar tip. There is moderate attenuation of branch vessels.  IMPRESSION: 1. Acute nonhemorrhagic infarcts within the right caudate head and anterior limb internal capsule are confirmed. 2. Moderate generalized atrophy and mild periventricular white matter disease likely reflects the sequela of chronic microvascular ischemia. 3. Occluded vessel versus high-grade stenosis superior right M2 division is distal to the areas of infarct. 4. Atherosclerotic changes within the cavernous carotid arteries bilaterally. 5. Mild to moderate atherosclerotic changes are present within the left A1 segment, the primary source of the anterior circulation. 6. Small vessel disease is likely exaggerated by patient motion.   Electronically Signed   By: Gennette Pachris  Mattern M.D.   On: 04/04/2013 21:05   Mr Brain Wo Contrast  04/04/2013   CLINICAL DATA:  Slurred speech, disorientation, and confusion beginning this afternoon. Question stroke.  EXAM: MRI HEAD WITHOUT CONTRAST  MRA HEAD WITHOUT CONTRAST  TECHNIQUE: Multiplanar, multiecho pulse sequences of the brain and surrounding structures were obtained without intravenous contrast. Angiographic images of the head were obtained using MRA technique without  contrast.  COMPARISON:  CT head without contrast  04/04/2013. MRI brain 11/01/2012.  FINDINGS: MRI HEAD FINDINGS  Acute nonhemorrhagic infarcts are confirmed in the right caudate head and anterior limb internal capsule. No additional infarcts are present.  T2 signal hyperintensity is associated with the infarcts. Moderate atrophy and periventricular white matter disease is otherwise stable. The ventricles are proportionate to the degree of atrophy. No significant extra-axial fluid collection is present. Flow is present in the major intracranial arteries. The patient is status post bilateral lens extractions. Mild mucosal thickening is noted in the inferior frontal sinuses, the anterior ethmoid air cells, the inferior maxillary sinuses and throughout the sphenoid sinuses. The mastoid air cells are clear.  MRA HEAD FINDINGS  The time-of-flight images a performed twice. The images are degraded by patient motion.  The internal carotid arteries demonstrate mild irregularity in the cavernous segments bilaterally, right greater than left. There is no definite stenosis greater than 50%. The right A1 segment is aplastic. Mild to moderate right irregularity is present in the left A1 segment. Both A2 segments are visualized although the anterior communicating artery is incompletely seen. The right MCA bifurcation is intact. The superior division is occluded proximally. A left MCA bifurcation is intact. Moderate small vessel disease is evident.  The right vertebral artery is slightly dominant to the left. The PICA origins are just below the field of view. The basilar artery is within normal limits. Both posterior cerebral arteries originate from the basilar tip. There is moderate attenuation of branch vessels.  IMPRESSION: 1. Acute nonhemorrhagic infarcts within the right caudate head and anterior limb internal capsule are confirmed. 2. Moderate generalized atrophy and mild periventricular white matter disease likely reflects the sequela of chronic microvascular ischemia. 3.  Occluded vessel versus high-grade stenosis superior right M2 division is distal to the areas of infarct. 4. Atherosclerotic changes within the cavernous carotid arteries bilaterally. 5. Mild to moderate atherosclerotic changes are present within the left A1 segment, the primary source of the anterior circulation. 6. Small vessel disease is likely exaggerated by patient motion.   Electronically Signed   By: Gennette Pac M.D.   On: 04/04/2013 21:05         Subjective: Patient is feeling better but still feels weak. Denies any fevers, chills, chest discomfort, shortness breath, nausea, vomiting, diarrhea, abdominal pain, dysuria, hematuria   Objective: Filed Vitals:   04/06/13 0820 04/06/13 1041 04/06/13 1304 04/06/13 1459  BP:  126/50 163/75   Pulse:  110 106   Temp:  98.1 F (36.7 C) 97.9 F (36.6 C)   TempSrc:  Oral Oral   Resp:  20 20   Height:      Weight:      SpO2: 97% 97% 99% 97%    Intake/Output Summary (Last 24 hours) at 04/06/13 1843 Last data filed at 04/06/13 1600  Gross per 24 hour  Intake   3130 ml  Output    150 ml  Net   2980 ml   Weight change:  Exam:   General:  Pt is alert, follows commands appropriately, not in acute distress  HEENT: No icterus, No thrush,  [place]/AT  Cardiovascular: IRRR, S1/S2, no rubs, no gallops  Respiratory: Bilateral scattered rale. Scattered basilar wheezing. Good air movement.   Abdomen: Soft/+BS, non tender, non distended, no guarding  Extremities: No edema, No lymphangitis, No petechiae, No rashes, no synovitis  Data Reviewed: Basic Metabolic Panel:  Recent Labs Lab 04/04/13 1710 04/04/13 1723 04/05/13 0820 04/06/13 1610  NA 132* 134* 134* 138  K 4.1 3.8 3.6* 3.9  CL 92* 96 97 100  CO2 23  --  22 21  GLUCOSE 115* 112* 140* 116*  BUN 42* 40* 37* 28*  CREATININE 1.87* 2.00* 1.36* 1.13*  CALCIUM 8.8  --  8.4 8.5   Liver Function Tests:  Recent Labs Lab 04/04/13 1710  AST 33  ALT 21  ALKPHOS 29*  BILITOT  0.6  PROT 7.3  ALBUMIN 3.8   No results found for this basename: LIPASE, AMYLASE,  in the last 168 hours No results found for this basename: AMMONIA,  in the last 168 hours CBC:  Recent Labs Lab 04/04/13 1710 04/04/13 1723 04/05/13 0820 04/06/13 0508  WBC 4.7  --  4.8 6.2  NEUTROABS 3.3  --   --   --   HGB 12.7 13.3 12.1 12.1  HCT 36.4 39.0 34.5* 35.0*  MCV 91.9  --  92.2 92.1  PLT 122*  --  103* 128*   Cardiac Enzymes:  Recent Labs Lab 04/04/13 1710  TROPONINI <0.30   BNP: No components found with this basename: POCBNP,  CBG:  Recent Labs Lab 04/05/13 1153 04/05/13 1636 04/05/13 2201 04/06/13 0728 04/06/13 1146  GLUCAP 99 121* 143* 130* 89    Recent Results (from the past 240 hour(s))  URINE CULTURE     Status: None   Collection Time    04/05/13 10:29 AM      Result Value Range Status   Specimen Description URINE, CLEAN CATCH   Final   Special Requests Normal   Final   Culture  Setup Time     Final   Value: 04/05/2013 14:28     Performed at [facility] Count     Final   Value: NO GROWTH     Performed at [facility]   Culture     Final   Value: NO GROWTH     Performed at [facility]   Report Status 04/06/2013 FINAL   Final     Scheduled Meds: . atorvastatin  20 mg Oral q1800  . colesevelam  1,875 mg Oral BID WC  . coumadin book   Does not apply Once  . diltiazem  30 mg Oral Q6H  . enoxaparin (LOVENOX) injection  30 mg Subcutaneous Q24H  . ipratropium-albuterol  3 mL Nebulization Q6H  . metoprolol tartrate  50 mg Oral BID  . sertraline  50 mg Oral q morning - 10a  . warfarin  2 mg Oral Once  . [START ON 04/07/2013] Warfarin - Pharmacist Dosing Inpatient   Does not apply q1800   Continuous Infusions:    Brecklynn Jian, DO  [facility] Pager [phone]  If 7PM-7AM, please contact night-coverage [email or web address] Password [facility] 04/06/2013, 6:43 PM   LOS: 2 days

## 2013-04-06 NOTE — Progress Notes (Signed)
Rehab Admissions Coordinator Note:  Patient was screened by Aragon Scarantino M for aTrish Mageppropriateness for an Inpatient Acute Rehab Consult.  At this time, an inpatient rehab consult has been ordered and is pending completion.  [name], Moody Robben M 04/06/2013, 5:23 PM  I can be reached at [phone].

## 2013-04-06 NOTE — Progress Notes (Signed)
NEURO HOSPITALIST PROGRESS NOTE   SUBJECTIVE:                                                                                                                         Patient has no complaints.  Resting comfortably when I entered the room and able to follow all commands to best of her ability with here hearing difficulty.   OBJECTIVE:                                                                                                                           Vital signs in last 24 hours: Temp:  [98.1 F (36.7 C)-98.8 F (37.1 C)] 98.1 F (36.7 C) (01/09 0530) Pulse Rate:  [70-124] 70 (01/09 0530) Resp:  [18-20] 20 (01/09 0530) BP: (67-193)/(40-113) 113/99 mmHg (01/09 0530) SpO2:  [93 %-100 %] 97 % (01/09 0820)  Intake/Output from previous day: 01/08 0701 - 01/09 0700 In: 2920 [P.O.:720; I.V.:2200] Out: 400 [Urine:400] Intake/Output this shift: Total I/O In: 360 [P.O.:360] Out: -  Nutritional status: Carb Control  Past Medical History  Diagnosis Date  . Hypertension   . Hypercholesteremia      Neurologic Exam:  Mental Status:  Alert, oriented, thought content appropriate. Speech fluent without evidence of aphasia. Able to follow 3 step commands without difficulty.  Cranial Nerves:  II: Visual fields grossly normal, pupils equal, round, reactive to light and accommodation  III,IV, VI: ptosis not present, extra-ocular motions intact bilaterally  V,VII: smile symmetric, facial light touch sensation normal bilaterally  VIII: hearing normal bilaterally  IX,X: gag reflex present  XI: bilateral shoulder shrug  XII: midline tongue extension without atrophy or fasciculations  Motor:  Right : Upper extremity 5/5   Left:  Upper extremity 5/5   Lower extremity 5/5    Lower extremity 5/5  Tone and bulk:normal tone throughout; no atrophy noted  Sensory: Pinprick and light touch intact throughout, bilaterally  Deep Tendon Reflexes:  2+ throughout  and 1+ at the ankles.  Plantars:  Right: downgoing  Left: upgoing  Cerebellar:  normal finger-to-nose, normal heel-to-shin test       Lab Results: Lab Results  Component Value Date/Time   CHOL 140 04/05/2013  4:15 AM   Lipid  Panel  Recent Labs  04/05/13 0415  CHOL 140  TRIG 152*  HDL 40  CHOLHDL 3.5  VLDL 30  LDLCALC 70    Studies/Results: Dg Chest 2 View  04/05/2013   CLINICAL DATA:  Cough, hypertension, hypercholesterolemia  EXAM: CHEST  2 VIEW  COMPARISON:  12/24/2010  FINDINGS: Enlargement of cardiac silhouette.  Calcified tortuous aorta.  Pulmonary vascularity normal.  Lungs are emphysematous but clear.  No pleural effusion or pneumothorax.  Posttraumatic deformities of multiple lateral mid right ribs.  Mild osseous demineralization.  Surgical clips in right cervical region.  IMPRESSION: Enlargement of cardiac silhouette.  Probable emphysematous changes.   Electronically Signed   By: Ulyses SouthwardMark  Boles M.D.   On: 04/05/2013 16:53   Ct Head Wo Contrast  04/04/2013   CLINICAL DATA:  Altered mental status  EXAM: CT HEAD WITHOUT CONTRAST  TECHNIQUE: Contiguous axial images were obtained from the base of the skull through the vertex without intravenous contrast. Study was obtained within 24 hr of patient's arrival at the emergency department.  COMPARISON:  Brain CT and brain MRI November 01, 2012  FINDINGS: Mild diffuse atrophy is stable. There is no mass effect, hemorrhage, extra-axial fluid collection, or midline shift.  There is a new area of decreased attenuation in the right globus pallidum and anterior limb of the right internal capsule. There is also new decreased attenuation in the head of the caudate nucleus on the right. There is an old prior infarct in the medial left pons. There is also evidence of prior small infarct in the left thalamus, better seen on prior MR. There is mild periventricular small vessel disease.  Calcification in the mid cerebellar hemispheres bilaterally is stable  and felt to be physiologic. There is also mild basal ganglia calcification, felt to be physiologic.  Bony calvarium appears intact.  The mastoid air cells are clear.  IMPRESSION: Evidence of recent infarcts in the head of the caudate nucleus on the right with extension into the anterior limb of the right internal capsule and medial right globus pallidum.  Prior small infarcts as noted above. There is mild atrophy with periventricular small vessel disease. Basal ganglia and cerebellar calcifications are stable and felt to most likely be physiologic in etiology.   Electronically Signed   By: Bretta BangWilliam  Woodruff M.D.   On: 04/04/2013 17:44   Mr Maxine GlennMra Head Wo Contrast  04/04/2013   CLINICAL DATA:  Slurred speech, disorientation, and confusion beginning this afternoon. Question stroke.  EXAM: MRI HEAD WITHOUT CONTRAST  MRA HEAD WITHOUT CONTRAST  TECHNIQUE: Multiplanar, multiecho pulse sequences of the brain and surrounding structures were obtained without intravenous contrast. Angiographic images of the head were obtained using MRA technique without contrast.  COMPARISON:  CT head without contrast 04/04/2013. MRI brain 11/01/2012.  FINDINGS: MRI HEAD FINDINGS  Acute nonhemorrhagic infarcts are confirmed in the right caudate head and anterior limb internal capsule. No additional infarcts are present.  T2 signal hyperintensity is associated with the infarcts. Moderate atrophy and periventricular white matter disease is otherwise stable. The ventricles are proportionate to the degree of atrophy. No significant extra-axial fluid collection is present. Flow is present in the major intracranial arteries. The patient is status post bilateral lens extractions. Mild mucosal thickening is noted in the inferior frontal sinuses, the anterior ethmoid air cells, the inferior maxillary sinuses and throughout the sphenoid sinuses. The mastoid air cells are clear.  MRA HEAD FINDINGS  The time-of-flight images a performed twice. The images  are degraded by  patient motion.  The internal carotid arteries demonstrate mild irregularity in the cavernous segments bilaterally, right greater than left. There is no definite stenosis greater than 50%. The right A1 segment is aplastic. Mild to moderate right irregularity is present in the left A1 segment. Both A2 segments are visualized although the anterior communicating artery is incompletely seen. The right MCA bifurcation is intact. The superior division is occluded proximally. A left MCA bifurcation is intact. Moderate small vessel disease is evident.  The right vertebral artery is slightly dominant to the left. The PICA origins are just below the field of view. The basilar artery is within normal limits. Both posterior cerebral arteries originate from the basilar tip. There is moderate attenuation of branch vessels.  IMPRESSION: 1. Acute nonhemorrhagic infarcts within the right caudate head and anterior limb internal capsule are confirmed. 2. Moderate generalized atrophy and mild periventricular white matter disease likely reflects the sequela of chronic microvascular ischemia. 3. Occluded vessel versus high-grade stenosis superior right M2 division is distal to the areas of infarct. 4. Atherosclerotic changes within the cavernous carotid arteries bilaterally. 5. Mild to moderate atherosclerotic changes are present within the left A1 segment, the primary source of the anterior circulation. 6. Small vessel disease is likely exaggerated by patient motion.   Electronically Signed   By: Gennette Pac M.D.   On: 04/04/2013 21:05   Mr Brain Wo Contrast  04/04/2013   CLINICAL DATA:  Slurred speech, disorientation, and confusion beginning this afternoon. Question stroke.  EXAM: MRI HEAD WITHOUT CONTRAST  MRA HEAD WITHOUT CONTRAST  TECHNIQUE: Multiplanar, multiecho pulse sequences of the brain and surrounding structures were obtained without intravenous contrast. Angiographic images of the head were obtained  using MRA technique without contrast.  COMPARISON:  CT head without contrast 04/04/2013. MRI brain 11/01/2012.  FINDINGS: MRI HEAD FINDINGS  Acute nonhemorrhagic infarcts are confirmed in the right caudate head and anterior limb internal capsule. No additional infarcts are present.  T2 signal hyperintensity is associated with the infarcts. Moderate atrophy and periventricular white matter disease is otherwise stable. The ventricles are proportionate to the degree of atrophy. No significant extra-axial fluid collection is present. Flow is present in the major intracranial arteries. The patient is status post bilateral lens extractions. Mild mucosal thickening is noted in the inferior frontal sinuses, the anterior ethmoid air cells, the inferior maxillary sinuses and throughout the sphenoid sinuses. The mastoid air cells are clear.  MRA HEAD FINDINGS  The time-of-flight images a performed twice. The images are degraded by patient motion.  The internal carotid arteries demonstrate mild irregularity in the cavernous segments bilaterally, right greater than left. There is no definite stenosis greater than 50%. The right A1 segment is aplastic. Mild to moderate right irregularity is present in the left A1 segment. Both A2 segments are visualized although the anterior communicating artery is incompletely seen. The right MCA bifurcation is intact. The superior division is occluded proximally. A left MCA bifurcation is intact. Moderate small vessel disease is evident.  The right vertebral artery is slightly dominant to the left. The PICA origins are just below the field of view. The basilar artery is within normal limits. Both posterior cerebral arteries originate from the basilar tip. There is moderate attenuation of branch vessels.  IMPRESSION: 1. Acute nonhemorrhagic infarcts within the right caudate head and anterior limb internal capsule are confirmed. 2. Moderate generalized atrophy and mild periventricular white  matter disease likely reflects the sequela of chronic microvascular ischemia. 3. Occluded vessel versus high-grade  stenosis superior right M2 division is distal to the areas of infarct. 4. Atherosclerotic changes within the cavernous carotid arteries bilaterally. 5. Mild to moderate atherosclerotic changes are present within the left A1 segment, the primary source of the anterior circulation. 6. Small vessel disease is likely exaggerated by patient motion.   Electronically Signed   By: Gennette Pac M.D.   On: 04/04/2013 21:05   VASCULAR LAB  PRELIMINARY PRELIMINARY PRELIMINARY PRELIMINARY  Carotid duplex completed.  Preliminary report: Right - 40% to 59% ICA stenosis lower end of range. Unable to evaluate the vertebral due to respiratory interference. Left - 1% to 59% ICA stenosis upper end of range. Vertebral artery flow is antegrade.  SLAUGHTER, Mallika, RVS-  04/05/2013, 12:51 PM  2 D echo: Study Conclusions  - Left ventricle: The cavity size was normal. Wall thickness was increased in a pattern of mild LVH. Systolic function was normal. The estimated ejection fraction was in the range of 55% to 60%. Doppler parameters are consistent with abnormal left ventricular relaxation (grade 1 diastolic dysfunction). - Aortic valve: Mild regurgitation. - Mitral valve: Moderate regurgitation. - Pulmonary arteries: PA peak pressure: 36mm Hg (S).   MEDICATIONS                                                                                                                        Scheduled: . clopidogrel  75 mg Oral Q breakfast  . colesevelam  1,875 mg Oral BID WC  . enoxaparin (LOVENOX) injection  30 mg Subcutaneous Q24H  . ipratropium-albuterol  3 mL Nebulization Q6H  . metoprolol tartrate  50 mg Oral BID  . sertraline  50 mg Oral q morning - 10a  . simvastatin  40 mg Oral QHS    ASSESSMENT/PLAN:                                                                                                             80 y.o. female with acute right caudate head and internal capsule CVA  In small vessel distribution along with newly diagnosed orthostatic hypotension. Carotid doppler,2 D echo LDL within normal limits.  A1c 5.9.  BP while in hospital slighty  erratic between 67/42 and 193/113.  Recommend: 1) Continue Plavix daily 2) Continue to control BP.   No further recommendations per neurology.  S/O   Assessment and plan discussed with with attending physician and they are in agreement.    Felicie Morn PA-C [facility] [phone]  04/06/2013, 9:04 AM

## 2013-04-06 NOTE — Progress Notes (Signed)
Occupational Therapy Treatment Patient Details Name: Yvonne [name] MRN: [id number] DOB: 07/07/1918 Today's Date: 04/06/2013 Time: [id number] OT Time Calculation (min): 25 min  OT Assessment / Plan / Recommendation  History of present illness Yvonne [name] is an 80 y.o. female who was brought to the hospital due to feeling dizzy, imbalance and dysarthria.  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 Follow up MRI shows acute nonhemorrhagic infarcts within the right caudate head and anterior limb internal capsule. Patient was on ASA daily prior to arrival and has been changed to Plavix while in hospital   OT comments  Pt seen for OT to work on safety with functional mobility, toileting, grooming tasks. She will benefit from post acute rehab to improve safety for d/c home as she does not have 24/7 available.    Follow Up Recommendations  CIR;Supervision/Assistance - 24 hour    Barriers to Discharge       Equipment Recommendations  None recommended by OT    Recommendations for Other Services    Frequency Min 2X/week   Progress towards OT Goals Progress towards OT goals: Progressing toward goals  Plan Discharge plan needs to be updated    Precautions / Restrictions Precautions Precautions: Fall   Pertinent Vitals/Pain No complaint of pain    ADL  Grooming: Performed;Wash/dry hands;Brushing hair with min guard assist (min verbal cues to remember to wash hands after toileting) Where Assessed - Grooming: Unsupported standing Lower Body Dressing: Performed;Minimal assistance (to don underwear) Where Assessed - Lower Body Dressing: Supported sit to stand Toilet Transfer: Performed;Minimal assistance Toilet Transfer Method:  (mod verbal cues for safety) AcupuncturistToilet Transfer Equipment: Comfort height toilet;Grab bars Toileting -  Clothing Manipulation and Hygiene: Performed;Minimal assistance Where Assessed - Engineer, miningToileting Clothing Manipulation and Hygiene: Standing Equipment Used: Rolling walker ADL Comments: Pt tends to pull up on walker to stand and not reach back to sit down. She needs cues to use grab bar on the wall for support with standing. She also needed verbal cues to remember to wash her hands at the sink after using the toilet. Note some memory issues as pt had a hard time recalling what she had for breakfast this morning and called her sausage patty a "hamburger." She tends to move a little quickly at times so min cues to slow down for safety and use walker. Feel she would benefit from post acute rehab to improve safety with all ADL/functional mobility as she DOES NOT have 24/7 at home.     OT Diagnosis:    OT Problem List:   OT Treatment Interventions:     OT Goals(current goals can now be found in the care plan section)    Visit Information  Last OT Received On: 04/06/13 Assistance Needed: +1 History of Present Illness: Yvonne [name] is an 80 y.o. female who was brought to the hospital due to feeling dizzy, imbalance and dysarthria.  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 Follow up MRI shows acute nonhemorrhagic infarcts within the right caudate head and anterior limb internal capsule. Patient was on ASA daily prior to arrival and has been changed to Plavix while in hospital    Subjective Data  Prior Functioning       Cognition  Cognition Arousal/Alertness: Awake/alert Overall Cognitive Status: History of cognitive impairments - at baseline Area of Impairment: Orientation Orientation Level: Time Memory: Decreased short-term memory General Comments: forgot what she had for breakfast this am. cues to remember to wash hands after  toileting.    Mobility  Transfers Overall transfer level: Needs assistance Equipment used: Rolling walker (2 wheeled) Transfers: Sit to/from Stand Sit to Stand: Min guard;Min assist General transfer comment: initially min as pt trying to pull up on walker to stand, min guard with cues to use grab bar and complete.    Exercises      Balance   End of Session OT - End of Session Equipment Utilized During Treatment: Rolling walker Activity Tolerance: Patient tolerated treatment well Patient left: in chair;with call bell/phone within reach;with family/visitor present  GO     Lennox Laity [phone] 04/06/2013, 1:10 PM

## 2013-04-06 NOTE — Progress Notes (Addendum)
ANTICOAGULATION CONSULT NOTE - Initial Consult  Pharmacy Consult for:  Coumadin, Lovenox Indication:  Atrial fibrillation   No Known Allergies  Patient Measurements: Height: 5\' 5"  (165.1 cm) Weight: 172 lb 12.8 oz (78.382 kg) IBW/kg (Calculated) : 57   Vital Signs: Temp: 97.9 F (36.6 C) (01/09 1304) Temp src: Oral (01/09 1304) BP: 163/75 mmHg (01/09 1304) Pulse Rate: 106 (01/09 1304)  Labs:  Recent Labs  04/04/13 1710 04/04/13 1723 04/05/13 0415 04/05/13 0820 04/06/13 0508  HGB 12.7 13.3  --  12.1 12.1  HCT 36.4 39.0  --  34.5* 35.0*  PLT 122*  --   --  103* 128*  APTT  --   --  32  --   --   LABPROT  --   --  13.0  --   --   INR  --   --  1.00  --   --   CREATININE 1.87* 2.00*  --  1.36* 1.13*  TROPONINI <0.30  --   --   --   --     Estimated Creatinine Clearance: 32.9 ml/min (by C-G formula based on Cr of 1.13).   Medical History: Past Medical History  Diagnosis Date  . Hypertension   . Hypercholesteremia     Medications:  Scheduled:  . atorvastatin  20 mg Oral q1800  . colesevelam  1,875 mg Oral BID WC  . diltiazem  30 mg Oral Q6H  . enoxaparin (LOVENOX) injection  30 mg Subcutaneous Q24H  . ipratropium-albuterol  3 mL Nebulization Q6H  . metoprolol tartrate  50 mg Oral BID  . sertraline  50 mg Oral q morning - 10a    Assessment:  Asked to assist with Coumadin and Lovenox (for bridging) for this 80 year-old female with atrial fibrillation and acute non-hemorrhagic stroke.  The baseline PT/INR and aPTT from 04/05/13 are in the normal ranges.  The platelet count is low at 128K.  Note that full-dose Lovenox is not recommended by [facility] stroke [facility] following acute stroke due to risk for hemorrhagic conversion.  Goal of Therapy:   INR 2-3  Monitor platelets by anticoagulation protocol: Yes  Prevention of systemic thromboembolism.  Plan:   Continue Lovenox 30 mg subq every 24 hours as ordered.  Coumadin 2 mg tonight as the initial  dose.  Follow PT/INR daily  Coumadin teaching  [facility] Canadian Valley HospitalClaire Alcario Tinkey R.Ph. 04/06/2013 5:53 PM

## 2013-04-06 NOTE — Progress Notes (Signed)
Clinical Social Work Department BRIEF PSYCHOSOCIAL ASSESSMENT 04/06/2013  Patient:  Yvonne [name],Yvonne [name]     Account Number:  [id number]     Admit date:  04/04/2013  Clinical Social Worker:  Orpah GreekFOLEY,Placido Hangartner, LCSWA  Date/Time:  04/06/2013 02:47 PM  Referred by:  Physician  Date Referred:  04/06/2013 Referred for  SNF Placement   Other Referral:   Interview type:  Patient Other interview type:   and friend at bedside    PSYCHOSOCIAL DATA Living Status:  ALONE Admitted from facility:   Level of care:   Primary support name:  [name] (friend/POA) ph#: [phone] Primary support relationship to patient:  FRIEND Degree of support available:   good    CURRENT CONCERNS Current Concerns  Post-Acute Placement   Other Concerns:    SOCIAL WORK ASSESSMENT / PLAN CSW reviewed PT evaluation recommending SNF placement.   Assessment/plan status:  [facility] Other assessment/ plan:   Information/referral to community resources:   CSW completed FL[facility] and [facility] - provided [facility] to [facility] who [facility]    PATIENT'S/FAMILY'S RESPONSE TO PLAN OF CARE: Patient & friend are agreeable with plan [facility]. Anticipating possible discharge tomorrow, Sat. 04/07/13. [facility].       Unice BaileyKelly Foley, LCSW [facility] Clinical Social Worker cell #: [phone]

## 2013-04-06 NOTE — Progress Notes (Signed)
Physical Therapy Treatment Patient Details Name: Yvonne [name] MRN: [id number] DOB: 03/10/1919 Today's Date: 04/06/2013 Time: 4098-1191 PT Time Calculation (min): 33 min  PT Assessment / Plan / Recommendation  History of Present Illness Yvonne [name] is an 80 y.o. female who was brought to the hospital due to feeling dizzy, imbalance and dysarthria.  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 Follow up MRI shows acute nonhemorrhagic infarcts within the right caudate head and anterior limb internal capsule. Patient was on ASA daily prior to arrival and has been changed to Plavix while in hospital   PT Comments   Progressing with mobility. Significant fall risk (39/45) with BERG balance test-see test scores below. Friends present and state they are unable to provide 24/7 supervision-concerned about pt's safety at home alone. Friends questioning if pt could possibly d/c to CIR or SNF prior to returning home. Discussed d/c plan with pt who is willing to have a conversation with CSW about possible placement. If CIR is a possibility, this could be beneficial for pt. If CIR is not an option, would recommend short stay at SNF to maximize independence and safety with functional mobility if pt will agree. Made RN aware of need for CSW consult.  Follow Up Recommendations  CIR;Supervision/Assistance - 24 hour     Does the patient have the potential to tolerate intense rehabilitation     Barriers to Discharge        Equipment Recommendations  None recommended by PT    Recommendations for Other Services Rehab consult  Frequency Min 3X/week   Progress towards PT Goals Progress towards PT goals: Progressing toward goals  Plan Current plan remains appropriate    Precautions / Restrictions Precautions Precautions: Fall   Pertinent  Vitals/Pain No c/o pain    Mobility  Transfers Overall transfer level: Needs assistance Transfers: Sit to/from Stand Sit to Stand: Supervision Ambulation/Gait Ambulation/Gait assistance: Min guard;Min assist Ambulation Distance (Feet): 150 Feet Assistive device: None;Rolling walker (2 wheeled) Gait Pattern/deviations: Step-through pattern General Gait Details: Min assist for ambulation without assistive device. Min guard assist for ambulation with walker.     Exercises     PT Diagnosis:    PT Problem List:   PT Treatment Interventions:     PT Goals (current goals can now be found in the care plan section)    Visit Information  Last PT Received On: 04/06/13 Assistance Needed: +1 History of Present Illness: Yvonne [name] is an 80 y.o. female who was brought to the hospital due to feeling dizzy, imbalance and dysarthria.  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 Follow up MRI shows acute nonhemorrhagic infarcts within the right caudate head and anterior limb internal capsule. Patient was on ASA daily prior to arrival and has been changed to Plavix while in hospital    Subjective Data      Cognition  Cognition Arousal/Alertness: Awake/alert Overall Cognitive Status: History of cognitive impairments - at baseline Area of Impairment: Orientation Orientation Level: Time Memory: Decreased short-term memory    Balance  Standardized Balance Assessment Standardized Balance Assessment : Berg Balance Test Berg Balance Test Sit to Stand: Able to stand without using hands and stabilize  independently Standing Unsupported: Able to stand safely 2 minutes Sitting with Back Unsupported but Feet Supported on Floor or Stool: Able to sit safely and securely 2 minutes Stand to Sit: Sits safely with minimal use of  hands Transfers: Able to transfer safely, minor use of hands Standing Unsupported with Eyes Closed: Able to stand 10 seconds with supervision Standing Ubsupported with Feet Together: Able to place feet together independently but unable to hold for 30 seconds From Standing, Reach Forward with Outstretched Arm: Can reach forward >12 cm safely (5") From Standing Position, Pick up Object from Floor: Able to pick up shoe safely and easily From Standing Position, Turn to Look Behind Over each Shoulder: Looks behind one side only/other side shows less weight shift Turn 360 Degrees: Able to turn 360 degrees safely in 4 seconds or less Standing Unsupported, Alternately Place Feet on Step/Stool: Needs assistance to keep from falling or unable to try Standing Unsupported, One Foot in Front: Loses balance while stepping or standing Standing on One Leg: Unable to try or needs assist to prevent fall Total Score: 39 General Comments General comments (skin integrity, edema, etc.): Score of 39/56-significant risk for falls  End of Session PT - End of Session Equipment Utilized During Treatment: Gait belt Activity Tolerance: Patient tolerated treatment well Patient left: in chair;with call bell/phone within reach;with family/visitor present Nurse Communication: Mobility status   GP     Yvonne [name], MPT Pager: [phone]

## 2013-04-06 NOTE — Progress Notes (Signed)
Clinical Social Work Department CLINICAL SOCIAL WORK PLACEMENT NOTE 04/06/2013  Patient:  [name],[place] K  Account Number:  [id number] Admit date:  04/04/2013  Clinical Social Worker:  Orpah GreekKELLY FOLEY, LCSWA  Date/time:  04/06/2013 02:52 PM  Clinical Social Work is seeking post-discharge placement for this patient at the following level of care:   SKILLED NURSING   (*CSW will update this form in Epic as items are completed)   04/06/2013  [facility]/family provided with [facility] of Clinical Social Work's list of facilities offering this level of care within the geographic area requested by the patient (or if unable, by the patient's family).  04/06/2013  Patient/family informed of their freedom to choose among providers that offer the needed level of care, that participate in Medicare, Medicaid or managed care program needed by the patient, have an available bed and are willing to accept the patient.  04/06/2013  Patient/family informed of [facility]' ownership interest in [facility], as well as of the fact that they are under no obligation to receive care at this facility.  PASARR submitted to EDS on 04/06/2013 PASARR number received from EDS on 04/06/2013  FL2 transmitted to all facilities in geographic area requested by pt/family on  04/06/2013 FL2 transmitted to all facilities within larger geographic area on   Patient informed that his/her managed care company has contracts with or will negotiate with  certain facilities, including the following:     Patient/family informed of bed offers received:  04/06/2013 [facility] Physician recommends and patient chooses bed at    Patient to be transferred to  on   Patient to be transferred to facility by   The following physician request were entered in Epic:   Additional Comments:   Unice BaileyKelly Foley, LCSW [facility] Clinical Social Worker cell  #: [phone]

## 2013-04-07 DIAGNOSIS — I10 Essential (primary) hypertension: Secondary | ICD-10-CM

## 2013-04-07 DIAGNOSIS — J449 Chronic obstructive pulmonary disease, unspecified: Secondary | ICD-10-CM

## 2013-04-07 DIAGNOSIS — Z87891 Personal history of nicotine dependence: Secondary | ICD-10-CM

## 2013-04-07 DIAGNOSIS — R0989 Other specified symptoms and signs involving the circulatory and respiratory systems: Secondary | ICD-10-CM

## 2013-04-07 DIAGNOSIS — R9431 Abnormal electrocardiogram [ECG] [EKG]: Secondary | ICD-10-CM

## 2013-04-07 DIAGNOSIS — I491 Atrial premature depolarization: Secondary | ICD-10-CM

## 2013-04-07 DIAGNOSIS — R0609 Other forms of dyspnea: Secondary | ICD-10-CM

## 2013-04-07 DIAGNOSIS — I779 Disorder of arteries and arterioles, unspecified: Secondary | ICD-10-CM

## 2013-04-07 LAB — BASIC METABOLIC PANEL
BUN: 20 mg/dL (ref 6–23)
CHLORIDE: 100 meq/L (ref 96–112)
CO2: 23 meq/L (ref 19–32)
CREATININE: 0.94 mg/dL (ref 0.50–1.10)
Calcium: 8.3 mg/dL — ABNORMAL LOW (ref 8.4–10.5)
GFR calc Af Amer: 59 mL/min — ABNORMAL LOW (ref >90)
GFR calc non Af Amer: 51 mL/min — ABNORMAL LOW (ref >90)
Glucose, Bld: 112 mg/dL — ABNORMAL HIGH (ref 70–99)
Potassium: 3.7 mEq/L (ref 3.7–5.3)
Sodium: 137 mEq/L (ref 137–147)

## 2013-04-07 LAB — PROTIME-INR
INR: 1.06 (ref 0.00–1.49)
PROTHROMBIN TIME: 13.6 s (ref 11.6–15.2)

## 2013-04-07 LAB — MAGNESIUM: Magnesium: 1.3 mg/dL — ABNORMAL LOW (ref 1.5–2.5)

## 2013-04-07 MED ORDER — ENOXAPARIN SODIUM 40 MG/0.4ML ~~LOC~~ SOLN
40.0000 mg | SUBCUTANEOUS | Status: DC
Start: 2013-04-07 — End: 2013-04-07
  Filled 2013-04-07: qty 0.4

## 2013-04-07 MED ORDER — MAGNESIUM SULFATE 40 MG/ML IJ SOLN
2.0000 g | Freq: Once | INTRAMUSCULAR | Status: AC
  Administered 2013-04-07: 17:00:00 2 g via INTRAVENOUS
  Filled 2013-04-07: qty 50

## 2013-04-07 MED ORDER — ENOXAPARIN SODIUM 40 MG/0.4ML ~~LOC~~ SOLN
40.0000 mg | SUBCUTANEOUS | Status: DC
  Administered 2013-04-07 – 2013-04-08 (×2): 40 mg via SUBCUTANEOUS
  Filled 2013-04-07 (×3): qty 0.4

## 2013-04-07 MED ORDER — WARFARIN SODIUM 2 MG PO TABS
2.0000 mg | ORAL_TABLET | ORAL | Status: AC
  Administered 2013-04-07: 2 mg via ORAL
  Filled 2013-04-07: qty 1

## 2013-04-07 MED ORDER — ENOXAPARIN SODIUM 30 MG/0.3ML ~~LOC~~ SOLN
30.0000 mg | SUBCUTANEOUS | Status: DC
  Filled 2013-04-07: qty 0.3

## 2013-04-07 MED ORDER — CLOPIDOGREL BISULFATE 75 MG PO TABS
75.0000 mg | ORAL_TABLET | Freq: Every day | ORAL | Status: DC
  Administered 2013-04-08 – 2013-04-09 (×2): 75 mg via ORAL
  Filled 2013-04-07 (×3): qty 1

## 2013-04-07 NOTE — Progress Notes (Signed)
[facility] PROGRESS NOTE  Yvonne [name] ZOX:[id number] DOB: 03/28/1919 DOA: 04/04/2013 PCP: No primary provider on file.  Assessment/Plan: Acute CVA (cerebrovascular accident)-nonhemorrhagic  -MR brain has revealed acute nonhemorrhagic infarcts in the right caudate region  -Neurology/stroke team appreciated  -Recommendations to discontinue aspirin in favor of Plavix  -Patient developed atrial fibrillation 04/06/2013  -Bilateral carotid duplex with bilateral nonobstructive stenosis less than 60%  -2-D echocardiogram EF 55-60%, grade 1 diastolic dysfunction, mod MR  -PT/OT evaluation--recommended CIR  -LDL 70---> continue current dose of statin  -hemoglobin A1c--5.9  Dysrhythmia -initally thought to be Afib-->reconfirmed as Sinus with PACs -d/c warfarin -restart plavix in am -Pt gave hx of dyspnea on exertion, with new stroke and abnormal EKG, consult cardiology -d/c diltiazem, continue metoprolol tartrate Dehydration/Orthostatic hypotension  -Improved with IV fluids  -Orthostatic vital signs obtained and patient's systolic blood pressure dropped into the 60s when standing  Acute renal failure  -Improved with IV fluids-  -Baseline BUN and creatinine from August of 2014 demonstrated BUN 30 and creatinine 1.19  -Presentation BUN 40 and creatinine 2.0  -Suspect combination of ACE inhibitor and diuretic contributed to acute renal failure (early ATN) -unclear if patient had poor oral intake recently  -ACE inhibitor with diuretic held at admission  Hypokalemia  -Replete and follow lab Hypomagnesemia -Repleted  Family Communication:   Pt at beside Disposition Plan:   Home when medically stable       Procedures/Studies: Dg Chest 2 View  04/05/2013   CLINICAL DATA:  Cough, hypertension, hypercholesterolemia  EXAM: CHEST  2 VIEW  COMPARISON:  12/24/2010  FINDINGS: Enlargement of cardiac silhouette.  Calcified tortuous aorta.  Pulmonary vascularity normal.  Lungs are  emphysematous but clear.  No pleural effusion or pneumothorax.  Posttraumatic deformities of multiple lateral mid right ribs.  Mild osseous demineralization.  Surgical clips in right cervical region.  IMPRESSION: Enlargement of cardiac silhouette.  Probable emphysematous changes.   Electronically Signed   By: Ulyses Southward M.D.   On: 04/05/2013 16:53   Ct Head Wo Contrast  04/04/2013   CLINICAL DATA:  Altered mental status  EXAM: CT HEAD WITHOUT CONTRAST  TECHNIQUE: Contiguous axial images were obtained from the base of the skull through the vertex without intravenous contrast. Study was obtained within 24 hr of patient's arrival at the emergency department.  COMPARISON:  Brain CT and brain MRI November 01, 2012  FINDINGS: Mild diffuse atrophy is stable. There is no mass effect, hemorrhage, extra-axial fluid collection, or midline shift.  There is a new area of decreased attenuation in the right globus pallidum and anterior limb of the right internal capsule. There is also new decreased attenuation in the head of the caudate nucleus on the right. There is an old prior infarct in the medial left pons. There is also evidence of prior small infarct in the left thalamus, better seen on prior MR. There is mild periventricular small vessel disease.  Calcification in the mid cerebellar hemispheres bilaterally is stable and felt to be physiologic. There is also mild basal ganglia calcification, felt to be physiologic.  Bony calvarium appears intact.  The mastoid air cells are clear.  IMPRESSION: Evidence of recent infarcts in the head of the caudate nucleus on the right with extension into the anterior limb of the right internal capsule and medial right globus pallidum.  Prior small infarcts as noted above. There is mild atrophy with periventricular small vessel disease. Basal ganglia and cerebellar calcifications are stable and felt to most likely  be physiologic in etiology.   Electronically Signed   By: Bretta Bang  M.D.   On: 04/04/2013 17:44   Mr [name] Head Wo Contrast  04/04/2013   CLINICAL DATA:  Slurred speech, disorientation, and confusion beginning this afternoon. Question stroke.  EXAM: MRI HEAD WITHOUT CONTRAST  MRA HEAD WITHOUT CONTRAST  TECHNIQUE: Multiplanar, multiecho pulse sequences of the brain and surrounding structures were obtained without intravenous contrast. Angiographic images of the head were obtained using MRA technique without contrast.  COMPARISON:  CT head without contrast 04/04/2013. MRI brain 11/01/2012.  FINDINGS: MRI HEAD FINDINGS  Acute nonhemorrhagic infarcts are confirmed in the right caudate head and anterior limb internal capsule. No additional infarcts are present.  T2 signal hyperintensity is associated with the infarcts. Moderate atrophy and periventricular white matter disease is otherwise stable. The ventricles are proportionate to the degree of atrophy. No significant extra-axial fluid collection is present. Flow is present in the major intracranial arteries. The patient is status post bilateral lens extractions. Mild mucosal thickening is noted in the inferior frontal sinuses, the anterior ethmoid air cells, the inferior maxillary sinuses and throughout the sphenoid sinuses. The mastoid air cells are clear.  MRA HEAD FINDINGS  The time-of-flight images a performed twice. The images are degraded by patient motion.  The internal carotid arteries demonstrate mild irregularity in the cavernous segments bilaterally, right greater than left. There is no definite stenosis greater than 50%. The right A1 segment is aplastic. Mild to moderate right irregularity is present in the left A1 segment. Both A2 segments are visualized although the anterior communicating artery is incompletely seen. The right MCA bifurcation is intact. The superior division is occluded proximally. A left MCA bifurcation is intact. Moderate small vessel disease is evident.  The right vertebral artery is slightly dominant  to the left. The PICA origins are just below the field of view. The basilar artery is within normal limits. Both posterior cerebral arteries originate from the basilar tip. There is moderate attenuation of branch vessels.  IMPRESSION: 1. Acute nonhemorrhagic infarcts within the right caudate head and anterior limb internal capsule are confirmed. 2. Moderate generalized atrophy and mild periventricular white matter disease likely reflects the sequela of chronic microvascular ischemia. 3. Occluded vessel versus high-grade stenosis superior right M2 division is distal to the areas of infarct. 4. Atherosclerotic changes within the cavernous carotid arteries bilaterally. 5. Mild to moderate atherosclerotic changes are present within the left A1 segment, the primary source of the anterior circulation. 6. Small vessel disease is likely exaggerated by patient motion.   Electronically Signed   By: Gennette Pac M.D.   On: 04/04/2013 21:05   Mr Brain Wo Contrast  04/04/2013   CLINICAL DATA:  Slurred speech, disorientation, and confusion beginning this afternoon. Question stroke.  EXAM: MRI HEAD WITHOUT CONTRAST  MRA HEAD WITHOUT CONTRAST  TECHNIQUE: Multiplanar, multiecho pulse sequences of the brain and surrounding structures were obtained without intravenous contrast. Angiographic images of the head were obtained using MRA technique without contrast.  COMPARISON:  CT head without contrast 04/04/2013. MRI brain 11/01/2012.  FINDINGS: MRI HEAD FINDINGS  Acute nonhemorrhagic infarcts are confirmed in the right caudate head and anterior limb internal capsule. No additional infarcts are present.  T2 signal hyperintensity is associated with the infarcts. Moderate atrophy and periventricular white matter disease is otherwise stable. The ventricles are proportionate to the degree of atrophy. No significant extra-axial fluid collection is present. Flow is present in the major intracranial arteries. The patient is  status post  bilateral lens extractions. Mild mucosal thickening is noted in the inferior frontal sinuses, the anterior ethmoid air cells, the inferior maxillary sinuses and throughout the sphenoid sinuses. The mastoid air cells are clear.  MRA HEAD FINDINGS  The time-of-flight images a performed twice. The images are degraded by patient motion.  The internal carotid arteries demonstrate mild irregularity in the cavernous segments bilaterally, right greater than left. There is no definite stenosis greater than 50%. The right A1 segment is aplastic. Mild to moderate right irregularity is present in the left A1 segment. Both A2 segments are visualized although the anterior communicating artery is incompletely seen. The right MCA bifurcation is intact. The superior division is occluded proximally. A left MCA bifurcation is intact. Moderate small vessel disease is evident.  The right vertebral artery is slightly dominant to the left. The PICA origins are just below the field of view. The basilar artery is within normal limits. Both posterior cerebral arteries originate from the basilar tip. There is moderate attenuation of branch vessels.  IMPRESSION: 1. Acute nonhemorrhagic infarcts within the right caudate head and anterior limb internal capsule are confirmed. 2. Moderate generalized atrophy and mild periventricular white matter disease likely reflects the sequela of chronic microvascular ischemia. 3. Occluded vessel versus high-grade stenosis superior right M2 division is distal to the areas of infarct. 4. Atherosclerotic changes within the cavernous carotid arteries bilaterally. 5. Mild to moderate atherosclerotic changes are present within the left A1 segment, the primary source of the anterior circulation. 6. Small vessel disease is likely exaggerated by patient motion.   Electronically Signed   By: Gennette Pac M.D.   On: 04/04/2013 21:05         Subjective: Patient denies fevers, chills, chest pain, shortness  breath, nausea, vomiting, diarrhea, abdominal pain. No rashes. No dizziness.  Objective: Filed Vitals:   04/07/13 0215 04/07/13 0608 04/07/13 0908 04/07/13 1455  BP:  141/68  125/62  Pulse: 111 70  77  Temp:  98.3 F (36.8 C)  98.3 F (36.8 C)  TempSrc:  Oral  Oral  Resp: 18 18  20   Height:      Weight:      SpO2: 100% 98% 92% 98%    Intake/Output Summary (Last 24 hours) at 04/07/13 1612 Last data filed at 04/07/13 1552  Gross per 24 hour  Intake    720 ml  Output    200 ml  Net    520 ml   Weight change:  Exam:   General:  Pt is alert, follows commands appropriately, not in acute distress  HEENT: No icterus, No thrush, No neck mass, [place]/AT  Cardiovascular: irregular, no rub  Respiratory: Minimal bibasilar wheeze. Scatter rales bilateral.  Abdomen: Soft/+BS, non tender, non distended, no guarding  Extremities: No edema, No lymphangitis, No petechiae, No rashes, no synovitis  Data Reviewed: Basic Metabolic Panel:  Recent Labs Lab 04/04/13 1710 04/04/13 1723 04/05/13 0820 04/06/13 0508 04/07/13 0350  NA 132* 134* 134* 138 137  K 4.1 3.8 3.6* 3.9 3.7  CL 92* 96 97 100 100  CO2 23  --  22 21 23   GLUCOSE 115* 112* 140* 116* 112*  BUN 42* 40* 37* 28* 20  CREATININE 1.87* 2.00* 1.36* 1.13* 0.94  CALCIUM 8.8  --  8.4 8.5 8.3*  MG  --   --   --   --  1.3*   Liver Function Tests:  Recent Labs Lab 04/04/13 1710  AST 33  ALT 21  ALKPHOS 29*  BILITOT 0.6  PROT 7.3  ALBUMIN 3.8   No results found for this basename: LIPASE, AMYLASE,  in the last 168 hours No results found for this basename: AMMONIA,  in the last 168 hours CBC:  Recent Labs Lab 04/04/13 1710 04/04/13 1723 04/05/13 0820 04/06/13 0508  WBC 4.7  --  4.8 6.2  NEUTROABS 3.3  --   --   --   HGB 12.7 13.3 12.1 12.1  HCT 36.4 39.0 34.5* 35.0*  MCV 91.9  --  92.2 92.1  PLT 122*  --  103* 128*   Cardiac Enzymes:  Recent Labs Lab 04/04/13 1710  TROPONINI <0.30   BNP: No components  found with this basename: POCBNP,  CBG:  Recent Labs Lab 04/05/13 1153 04/05/13 1636 04/05/13 2201 04/06/13 0728 04/06/13 1146  GLUCAP 99 121* 143* 130* 89    Recent Results (from the past 240 hour(s))  URINE CULTURE     Status: None   Collection Time    04/05/13 10:29 AM      Result Value Range Status   Specimen Description URINE, CLEAN CATCH   Final   Special Requests Normal   Final   Culture  Setup Time     Final   Value: 04/05/2013 14:28     Performed at [facility]   [place] Count     Final   Value: NO GROWTH     Performed at [facility]   Culture     Final   Value: NO GROWTH     Performed at [facility]   Report Status 04/06/2013 FINAL   Final     Scheduled Meds: . atorvastatin  20 mg Oral q1800  . colesevelam  1,875 mg Oral BID WC  . coumadin book   Does not apply Once  . diltiazem  30 mg Oral Q6H  . enoxaparin (LOVENOX) injection  40 mg Subcutaneous Q24H  . ipratropium-albuterol  3 mL Nebulization Q6H  . magnesium sulfate 1 - 4 g bolus IVPB  2 g Intravenous Once  . metoprolol tartrate  50 mg Oral BID  . sertraline  50 mg Oral q morning - 10a   Continuous Infusions:    Nicolena Schurman, DO  [facility] Pager [phone]  If 7PM-7AM, please contact night-coverage [email or web address] Password [email or web address] 04/07/2013, 4:12 PM   LOS: 3 days

## 2013-04-07 NOTE — Progress Notes (Signed)
ANTICOAGULATION CONSULT NOTE - Initial Consult  Pharmacy Consult for:  Coumadin, Lovenox Indication:  Atrial fibrillation   No Known Allergies  Patient Measurements: Height: 5\' 5"  (165.1 cm) Weight: 172 lb 12.8 oz (78.382 kg) IBW/kg (Calculated) : 57   Vital Signs: Temp: 98.3 F (36.8 C) (01/10 0608) Temp src: Oral (01/10 0608) BP: 141/68 mmHg (01/10 0608) Pulse Rate: 70 (01/10 0608)  Labs:  Recent Labs  04/04/13 1710 04/04/13 1723 04/05/13 0415 04/05/13 0820 04/06/13 0508 04/07/13 0350  HGB 12.7 13.3  --  12.1 12.1  --   HCT 36.4 39.0  --  34.5* 35.0*  --   PLT 122*  --   --  103* 128*  --   APTT  --   --  32  --   --   --   LABPROT  --   --  13.0  --   --  13.6  INR  --   --  1.00  --   --  1.06  CREATININE 1.87* 2.00*  --  1.36* 1.13* 0.94  TROPONINI <0.30  --   --   --   --   --     Estimated Creatinine Clearance: 39.5 ml/min (by C-G formula based on Cr of 0.94).   Medical History: Past Medical History  Diagnosis Date  . Hypertension   . Hypercholesteremia     Medications:  Scheduled:  . atorvastatin  20 mg Oral q1800  . colesevelam  1,875 mg Oral BID WC  . coumadin book   Does not apply Once  . diltiazem  30 mg Oral Q6H  . enoxaparin (LOVENOX) injection  40 mg Subcutaneous Q24H  . ipratropium-albuterol  3 mL Nebulization Q6H  . metoprolol tartrate  50 mg Oral BID  . sertraline  50 mg Oral q morning - 10a  . warfarin  2 mg Oral Once  . Warfarin - Pharmacist Dosing Inpatient   Does not apply q1800    Assessment: 89 yoF with acute non-hemorrhagic stroke and new diagnosis of atrial fibrillation.  Patient was out of window for tPA.  CHADSVASc = 6.  PTA aspirin changed to Plavix.  Lovenox for VTE prophylaxis started and orders to bridge to warfarin per pharmacy management started 1/9.  Baseline anticoagulation labs (PT/INR, aPTT) WNL.  Baseline platelets 128K.  Today's INR 1.06.  Warfarin 2 mg ordered last night but NOT charted as given.  SCr 0.94  (improved), CrCl~46 ml/min No bleeding/complications reported.   Goal of Therapy:   INR 2-3  Monitor platelets by anticoagulation protocol: Yes  Prevention of systemic thromboembolism.  Plan:  1.  Warfarin 2 mg po now since has not yet received first dose. 2.  Adjust Lovenox to 40 mg SQ every 24 hours due to improved SCr (CrCl>30 ml/min).  Continue prophylaxis dosing while INR subtherapeutic.  Full-dose Lovenox not recommended in setting of acute CVA due to risk of hemorrhagic transformation. 3.  Follow up INR in AM.  4.  F/u CBC at least q72h while on Lovenox.  Clance BollAmanda Teriann Livingood, PharmD, BCPS Pager: [phone] 04/07/2013 2:24 PM

## 2013-04-07 NOTE — Consult Note (Signed)
CARDIOLOGY CONSULT NOTE  Patient ID: Yvonne [name] MRN: [id number] DOB/AGE: March 24, 1919 80 y.o.  Admit date: 04/04/2013 Primary Physician No primary provider on file.  Reason for Consultation: abnormal ECG, DOE, acute CVA  HPI: The patient is a very pleasant 80 yr old woman who was admitted with an acute right caudate head and internal capsule CVA on 04/04/12. She has a h/o HTN and hyperlipidemia, as well as right carotid endarterectomy several years ago. She had been on ASA as an outpatient which was subsequently switched to Plavix. In the interim, ECG's were obtained and there was some suspicion for atrial fibrillation and thus the patient was converted to Lovenox and warfarin. However, upon further review of the ECG's, it appeared to be more consistent with normal sinus rhythm with sinus arrhythmia and PAC's. For this reason, warfarin has since been discontinued with a plan to reinitiate Plavix on 04/08/12. Lipids on 1/8 showed HDL 40 and LDL 70. Was noted to be hypomagnesemic at 1.3 but this has since been repleted and repeat serum Mg is pending. She had been on an ACEI as an outpatient was was noted to be in acute renal failure and this has since been held.  An echocardiogram performed on 04/05/13 (details below) showed normal LV systolic function (EF 55-60%), no regional wall motion abnormalities, mild LVH, grade I diastolic dysfunction, mild aortic and moderate mitral regurgitation. She apparently had previously reported dyspnea with exertion, but she denies this categorically during my interview. She says she lives by herself in a one story home with her dog, and is able to walk her dog without difficulty, specifically denying chest pain, shortness of breath, syncope, palpitations, leg swelling, orthopnea, and paroxysmal nocturnal dyspnea. She said she smoked from the ages of [age] and smoked a minimum of 1 ppd. She denies ever needing inhalers or a diagnosis of COPD. Chest xray did  confirm emphysematous changes. She denies a h/o MI or heart disease. She is unaware that she's had a stroke. She is alert and oriented and answers all my questions very appropriately.  SocHx: family originally from [place]. She resided in [place] years. Smoked at least 1 ppd from ages [age].  FamHx: noncontributory.     No Known Allergies  Current Facility-Administered Medications  Medication Dose Route Frequency Provider Last Rate Last Dose  . acetaminophen (TYLENOL) tablet 650 mg  650 mg Oral Q4H PRN Richarda OverlieNayana Abrol, MD      . atorvastatin (LIPITOR) tablet 20 mg  20 mg Oral q1800 Catarina Hartshornavid Tat, MD   20 mg at 04/06/13 1746  . colesevelam [facility](WELCHOL) tablet 1,875 mg  1,875 mg Oral BID WC Richarda OverlieNayana Abrol, MD   1,875 mg at 04/07/13 0814  . coumadin book   Does not apply Once Zella Balllaire R Haskins, RPH      . diltiazem (CARDIZEM) tablet 30 mg  30 mg Oral Q6H Catarina Hartshornavid Tat, MD   30 mg at 04/07/13 1252  . enoxaparin (LOVENOX) injection 40 mg  40 mg Subcutaneous Q24H Maryanna ShapeAmanda M Runyon, RPH      . ipratropium-albuterol (DUONEB) 0.5-2.5 (3) MG/3ML nebulizer solution 3 mL  3 mL Nebulization Q6H Catarina Hartshornavid Tat, MD   3 mL at 04/07/13 1408  . magnesium sulfate IVPB 2 g 50 mL  2 g Intravenous Once Onalee Huaavid Tat, MD      . metoprolol (LOPRESSOR) tablet 50 mg  50 mg Oral BID Catarina Hartshornavid Tat, MD   50 mg at 04/07/13 0925  .  sertraline (ZOLOFT) tablet 50 mg  50 mg Oral q morning - 10a Richarda Overlie, MD   50 mg at 04/07/13 1610    Past Medical History  Diagnosis Date  . Hypertension   . Hypercholesteremia     Past Surgical History  Procedure Laterality Date  . Back surgery      History   Social History  . Marital Status: Married    Spouse Name: N/A    Number of Children: N/A  . Years of Education: N/A   Occupational History  . Not on file.   Social History Main Topics  . Smoking status: Former Smoker    Quit date: 04/05/1983  . Smokeless tobacco: Never Used  . Alcohol Use: No  . Drug Use: No  .  Sexual Activity: Not on file   Other Topics Concern  . Not on file   Social History Narrative  . No narrative on file     Family History  Problem Relation Age of Onset  . Hypertension Mother   . Hypertension Father      Prior to Admission medications   Medication Sig Start Date End Date Taking? Authorizing Provider  aspirin 325 MG EC tablet Take 325 mg by mouth every morning.   Yes Historical Provider, [place]  colesevelam (WELCHOL) 625 MG tablet Take 1,875 mg by mouth 2 (two) times daily with a meal.   Yes Historical Provider, [place]  lisinopril-hydrochlorothiazide (PRINZIDE,ZESTORETIC) 10-12.5 MG per tablet Take 1 tablet by mouth every morning.   Yes Historical Provider, [place]  metoprolol (LOPRESSOR) 50 MG tablet Take 50 mg by mouth 2 (two) times daily.   Yes Historical Provider, [place]  sertraline (ZOLOFT) 50 MG tablet Take 50 mg by mouth every morning.   Yes Historical Provider, [place]  simvastatin (ZOCOR) 40 MG tablet Take 40 mg by mouth at bedtime.   Yes Historical Provider, [place]     Review of systems complete and found to be negative unless listed above in HPI     Physical exam Blood pressure 125/62, pulse 77, temperature 98.3 F (36.8 C), temperature source Oral, resp. rate 20, height 5\' 5"  (1.651 m), weight 172 lb 12.8 oz (78.382 kg), SpO2 98.00%. General: NAD Neck: No JVD, no thyromegaly or thyroid nodule.  Lungs: Rhonchi and end-expiratory wheezes heard bilaterally with normal respiratory effort. CV: Nondisplaced PMI.  Heart mostly regular with premature contractions appreciated and occasional periods of irregularity, normal S1/S2, no S3/S4, no murmur.  No peripheral edema.  No carotid bruit.  Normal pedal pulses.  Abdomen: Soft, nontender, no hepatosplenomegaly, no distention.  Skin: Intact without lesions or rashes.  Neurologic: Alert and oriented x 3.  Psych: Normal affect. Extremities: No clubbing or cyanosis.  HEENT: Normal.   Labs:   Lab Results  Component Value Date    WBC 6.2 04/06/2013   HGB 12.1 04/06/2013   HCT 35.0* 04/06/2013   MCV 92.1 04/06/2013   PLT 128* 04/06/2013    Recent Labs Lab 04/04/13 1710  04/07/13 0350  NA 132*  < > 137  K 4.1  < > 3.7  CL 92*  < > 100  CO2 23  < > 23  BUN 42*  < > 20  CREATININE 1.87*  < > 0.94  CALCIUM 8.8  < > 8.3*  PROT 7.3  --   --   BILITOT 0.6  --   --   ALKPHOS 29*  --   --   ALT 21  --   --  AST 33  --   --   GLUCOSE 115*  < > 112*  < > = values in this interval not displayed. Lab Results  Component Value Date   CKTOTAL 222* 01/01/2010   CKMB 5.8* 01/01/2010   TROPONINI <0.30 04/04/2013    Lab Results  Component Value Date   CHOL 140 04/05/2013   Lab Results  Component Value Date   HDL 40 04/05/2013   Lab Results  Component Value Date   LDLCALC 70 04/05/2013   Lab Results  Component Value Date   TRIG 152* 04/05/2013   Lab Results  Component Value Date   CHOLHDL 3.5 04/05/2013   No results found for this basename: LDLDIRECT       EKG: reviewed multiple ECG's dating back to 2012. During this hospitalization, normal sinus rhythm with sinus arrhythmia and PAC's noted. Nonspecific T wave abnormality in inferior leads with some evidence of T wave inversions in V4-6, more prominent on ECG dated 11-01-2012 and also noted on ECG dated 12-24-2010. I reviewed multiple telemetry strips as well. What the monitor is calling "atrial fibrillation" is actually sinus rhythm with PAC's and short runs of multifocal atrial tachycardia.  Studies: Dg Chest 2 View  04/05/2013   CLINICAL DATA:  Cough, hypertension, hypercholesterolemia  EXAM: CHEST  2 VIEW  COMPARISON:  12/24/2010  FINDINGS: Enlargement of cardiac silhouette.  Calcified tortuous aorta.  Pulmonary vascularity normal.  Lungs are emphysematous but clear.  No pleural effusion or pneumothorax.  Posttraumatic deformities of multiple lateral mid right ribs.  Mild osseous demineralization.  Surgical clips in right cervical region.  IMPRESSION: Enlargement of cardiac  silhouette.  Probable emphysematous changes.   Electronically Signed   By: Ulyses Southward M.D.   On: 04/05/2013 16:53   Echo (04-05-2013): - Left ventricle: The cavity size was normal. Wall thickness was increased in a pattern of mild LVH. Systolic function was normal. The estimated ejection fraction was in the range of 55% to 60%. Doppler parameters are consistent with abnormal left ventricular relaxation (grade 1 diastolic dysfunction). - Aortic valve: Mild regurgitation. - Mitral valve: Moderate regurgitation. - Pulmonary arteries: PA peak pressure: 36mm Hg (S).    ASSESSMENT AND PLAN:  80 yr old woman admitted with right caudate head and internal capsule CVA on 04/04/12, with a PMH significant for HTN, hyperlipidemia, and PVD (right CEA), former h/o tobacco use, and abnormal ECG and telemetry strips, suspicious for atrial fibrillation, as well as T wave inversions concerning for ischemia.  1. Arrhythmia: I reviewed all ECG's and telemetry strips. What the monitor is calling "atrial fibrillation" is actually sinus rhythm with PAC's and short runs of multifocal atrial tachycardia. This is consistent with her h/o longstanding tobacco use and chest xray findings of emphysema. She likely has undiagnosed COPD, and multifocal atrial tachycardia is a common finding. Her Mg was previously low and has since been repleted. Would aim to keep Mg > 2 and K > 4 to raise dysrhythmic threshold. I recommend the continued use of metoprolol and Plavix. Given that her left atrium is also normal in size in spite of a h/o hypertension, this would argue (albeit non definitively) against atrial fibrillation as well. Sinus rhythm with frequent PAC's has been noted on ECG's dating back to September 2012 as well.  2. Lateral T wave inversions: these were also noted on ECG's from 10/2012 and 11/2010. Given that she denies any chest pain and significant shortness of breath, and has no clinical evidence of heart failure,  I do not  recommend any further cardiovascular workup. She has normal LV systolic function. While she has risk factors for CAD (age, HTN, hyperlipidemia, PVD, former h/o tobacco use, probable COPD/emphysema), I would continue Plavix and Lipitor and not proceed with any outpatient stress testing at this time.  3. Hypertension: now under better control on current therapy which includes metoprolol. Would resume ACEI when feasible.  4. Hyperlipidemia: HDL 40 and LDL 70. Continue Lipitor 20 mg daily.  Dispo: no additional recommendations at this time. Please call with questions. Will sign off.    Signed: Prentice Docker, M.D., F.A.C.C.  04/07/2013, 4:15 PM

## 2013-04-08 DIAGNOSIS — I498 Other specified cardiac arrhythmias: Secondary | ICD-10-CM

## 2013-04-08 LAB — BASIC METABOLIC PANEL
BUN: 22 mg/dL (ref 6–23)
CHLORIDE: 101 meq/L (ref 96–112)
CO2: 20 mEq/L (ref 19–32)
Calcium: 8.5 mg/dL (ref 8.4–10.5)
Creatinine, Ser: 0.99 mg/dL (ref 0.50–1.10)
GFR calc Af Amer: 56 mL/min — ABNORMAL LOW (ref >90)
GFR calc non Af Amer: 48 mL/min — ABNORMAL LOW (ref >90)
GLUCOSE: 105 mg/dL — AB (ref 70–99)
POTASSIUM: 4.6 meq/L (ref 3.7–5.3)
SODIUM: 136 meq/L — AB (ref 137–147)

## 2013-04-08 LAB — MAGNESIUM: Magnesium: 1.8 mg/dL (ref 1.5–2.5)

## 2013-04-08 LAB — PROTIME-INR
INR: 1.02 (ref 0.00–1.49)
Prothrombin Time: 13.2 seconds (ref 11.6–15.2)

## 2013-04-08 MED ORDER — MOMETASONE FURO-FORMOTEROL FUM 200-5 MCG/ACT IN AERO
2.0000 | INHALATION_SPRAY | Freq: Two times a day (BID) | RESPIRATORY_TRACT | Status: DC
  Administered 2013-04-08 – 2013-04-09 (×2): 2 via RESPIRATORY_TRACT
  Filled 2013-04-08: qty 8.8

## 2013-04-08 MED ORDER — SODIUM CHLORIDE 0.9 % IV SOLN
1000.0000 mL | INTRAVENOUS | Status: AC
  Administered 2013-04-08: 1000 mL via INTRAVENOUS

## 2013-04-08 MED ORDER — DILTIAZEM HCL ER COATED BEADS 120 MG PO CP24
120.0000 mg | ORAL_CAPSULE | ORAL | Status: DC
  Administered 2013-04-08: 120 mg via ORAL
  Filled 2013-04-08 (×2): qty 1

## 2013-04-08 MED ORDER — IPRATROPIUM-ALBUTEROL 0.5-2.5 (3) MG/3ML IN SOLN
3.0000 mL | Freq: Four times a day (QID) | RESPIRATORY_TRACT | Status: DC
  Administered 2013-04-08 – 2013-04-09 (×3): 3 mL via RESPIRATORY_TRACT
  Filled 2013-04-08 (×4): qty 3

## 2013-04-08 MED ORDER — ALBUTEROL SULFATE (2.5 MG/3ML) 0.083% IN NEBU: 2.5000 mg | INHALATION_SOLUTION | RESPIRATORY_TRACT | Status: DC | PRN

## 2013-04-08 NOTE — Progress Notes (Addendum)
[facility] PROGRESS NOTE  Yvonne [name] ZOX:[id number] DOB: 11/11/1918 DOA: 04/04/2013 PCP: No primary provider on file.  Assessment/Plan: Acute CVA (cerebrovascular accident)-nonhemorrhagic  -MR brain has revealed acute nonhemorrhagic infarcts in the right caudate region  -Neurology/stroke team appreciated  -Recommendations to discontinue aspirin in favor of Plavix  -Patient developed MAT 04/06/2013  -Bilateral carotid duplex with bilateral nonobstructive stenosis less than 60%  -2-D echocardiogram EF 55-60%, grade 1 diastolic dysfunction, mod MR  -PT/OT evaluation--recommended CIR but insurance will not pay -LDL 70---> continue current dose of statin  -hemoglobin A1c--5.9  Dysrhythmia  -initally thought to be Afib-->reconfirmed as Sinus with PACs with short runs of MAT -d/c warfarin  -restart plavix -Pt gave hx of dyspnea on exertion, with new stroke and abnormal EKG, -appreciate cardiology input -due to intermitten runs of MAT and continued elevated BP-->add cardizem -continue metoprolol tartrate  Dehydration/Orthostatic hypotension  -Improved with IV fluids  -Orthostatic vital signs obtained and patient's systolic blood pressure dropped into the 60s when standing  Acute renal failure  -Improved with IV fluids -repeat am lab -Baseline creatinine 1.0-1.3 -Presentation BUN 40 and creatinine 2.0  -Suspect combination of ACE inhibitor and diuretic contributed to acute renal failure (early ATN) -unclear if patient had poor oral intake recently  -ACE inhibitor with diuretic held at admission  Hypokalemia  -Replete and follow lab  Hypomagnesemia  -Repleted  Family Communication: caregiver at beside  Disposition Plan: Home when medically stable         Procedures/Studies: Dg Chest 2 View  04/05/2013   CLINICAL DATA:  Cough, hypertension, hypercholesterolemia  EXAM: CHEST  2 VIEW  COMPARISON:  12/24/2010  FINDINGS: Enlargement of cardiac silhouette.  Calcified  tortuous aorta.  Pulmonary vascularity normal.  Lungs are emphysematous but clear.  No pleural effusion or pneumothorax.  Posttraumatic deformities of multiple lateral mid right ribs.  Mild osseous demineralization.  Surgical clips in right cervical region.  IMPRESSION: Enlargement of cardiac silhouette.  Probable emphysematous changes.   Electronically Signed   By: Ulyses Southward M.D.   On: 04/05/2013 16:53   Ct Head Wo Contrast  04/04/2013   CLINICAL DATA:  Altered mental status  EXAM: CT HEAD WITHOUT CONTRAST  TECHNIQUE: Contiguous axial images were obtained from the base of the skull through the vertex without intravenous contrast. Study was obtained within 24 hr of patient's arrival at the emergency department.  COMPARISON:  Brain CT and brain MRI November 01, 2012  FINDINGS: Mild diffuse atrophy is stable. There is no mass effect, hemorrhage, extra-axial fluid collection, or midline shift.  There is a new area of decreased attenuation in the right globus pallidum and anterior limb of the right internal capsule. There is also new decreased attenuation in the head of the caudate nucleus on the right. There is an old prior infarct in the medial left pons. There is also evidence of prior small infarct in the left thalamus, better seen on prior MR. There is mild periventricular small vessel disease.  Calcification in the mid cerebellar hemispheres bilaterally is stable and felt to be physiologic. There is also mild basal ganglia calcification, felt to be physiologic.  Bony calvarium appears intact.  The mastoid air cells are clear.  IMPRESSION: Evidence of recent infarcts in the head of the caudate nucleus on the right with extension into the anterior limb of the right internal capsule and medial right globus pallidum.  Prior small infarcts as noted above. There is mild atrophy with periventricular small vessel disease. Basal ganglia  and cerebellar calcifications are stable and felt to most likely be physiologic in  etiology.   Electronically Signed   By: Bretta Bang M.D.   On: 04/04/2013 17:44   Mr [name] Head Wo Contrast  04/04/2013   CLINICAL DATA:  Slurred speech, disorientation, and confusion beginning this afternoon. Question stroke.  EXAM: MRI HEAD WITHOUT CONTRAST  MRA HEAD WITHOUT CONTRAST  TECHNIQUE: Multiplanar, multiecho pulse sequences of the brain and surrounding structures were obtained without intravenous contrast. Angiographic images of the head were obtained using MRA technique without contrast.  COMPARISON:  CT head without contrast 04/04/2013. MRI brain 11/01/2012.  FINDINGS: MRI HEAD FINDINGS  Acute nonhemorrhagic infarcts are confirmed in the right caudate head and anterior limb internal capsule. No additional infarcts are present.  T2 signal hyperintensity is associated with the infarcts. Moderate atrophy and periventricular white matter disease is otherwise stable. The ventricles are proportionate to the degree of atrophy. No significant extra-axial fluid collection is present. Flow is present in the major intracranial arteries. The patient is status post bilateral lens extractions. Mild mucosal thickening is noted in the inferior frontal sinuses, the anterior ethmoid air cells, the inferior maxillary sinuses and throughout the sphenoid sinuses. The mastoid air cells are clear.  MRA HEAD FINDINGS  The time-of-flight images a performed twice. The images are degraded by patient motion.  The internal carotid arteries demonstrate mild irregularity in the cavernous segments bilaterally, right greater than left. There is no definite stenosis greater than 50%. The right A1 segment is aplastic. Mild to moderate right irregularity is present in the left A1 segment. Both A2 segments are visualized although the anterior communicating artery is incompletely seen. The right MCA bifurcation is intact. The superior division is occluded proximally. A left MCA bifurcation is intact. Moderate small vessel disease is  evident.  The right vertebral artery is slightly dominant to the left. The PICA origins are just below the field of view. The basilar artery is within normal limits. Both posterior cerebral arteries originate from the basilar tip. There is moderate attenuation of branch vessels.  IMPRESSION: 1. Acute nonhemorrhagic infarcts within the right caudate head and anterior limb internal capsule are confirmed. 2. Moderate generalized atrophy and mild periventricular white matter disease likely reflects the sequela of chronic microvascular ischemia. 3. Occluded vessel versus high-grade stenosis superior right M2 division is distal to the areas of infarct. 4. Atherosclerotic changes within the cavernous carotid arteries bilaterally. 5. Mild to moderate atherosclerotic changes are present within the left A1 segment, the primary source of the anterior circulation. 6. Small vessel disease is likely exaggerated by patient motion.   Electronically Signed   By: Gennette Pac M.D.   On: 04/04/2013 21:05   Mr Brain Wo Contrast  04/04/2013   CLINICAL DATA:  Slurred speech, disorientation, and confusion beginning this afternoon. Question stroke.  EXAM: MRI HEAD WITHOUT CONTRAST  MRA HEAD WITHOUT CONTRAST  TECHNIQUE: Multiplanar, multiecho pulse sequences of the brain and surrounding structures were obtained without intravenous contrast. Angiographic images of the head were obtained using MRA technique without contrast.  COMPARISON:  CT head without contrast 04/04/2013. MRI brain 11/01/2012.  FINDINGS: MRI HEAD FINDINGS  Acute nonhemorrhagic infarcts are confirmed in the right caudate head and anterior limb internal capsule. No additional infarcts are present.  T2 signal hyperintensity is associated with the infarcts. Moderate atrophy and periventricular white matter disease is otherwise stable. The ventricles are proportionate to the degree of atrophy. No significant extra-axial fluid collection is present. Flow  is present in the  major intracranial arteries. The patient is status post bilateral lens extractions. Mild mucosal thickening is noted in the inferior frontal sinuses, the anterior ethmoid air cells, the inferior maxillary sinuses and throughout the sphenoid sinuses. The mastoid air cells are clear.  MRA HEAD FINDINGS  The time-of-flight images a performed twice. The images are degraded by patient motion.  The internal carotid arteries demonstrate mild irregularity in the cavernous segments bilaterally, right greater than left. There is no definite stenosis greater than 50%. The right A1 segment is aplastic. Mild to moderate right irregularity is present in the left A1 segment. Both A2 segments are visualized although the anterior communicating artery is incompletely seen. The right MCA bifurcation is intact. The superior division is occluded proximally. A left MCA bifurcation is intact. Moderate small vessel disease is evident.  The right vertebral artery is slightly dominant to the left. The PICA origins are just below the field of view. The basilar artery is within normal limits. Both posterior cerebral arteries originate from the basilar tip. There is moderate attenuation of branch vessels.  IMPRESSION: 1. Acute nonhemorrhagic infarcts within the right caudate head and anterior limb internal capsule are confirmed. 2. Moderate generalized atrophy and mild periventricular white matter disease likely reflects the sequela of chronic microvascular ischemia. 3. Occluded vessel versus high-grade stenosis superior right M2 division is distal to the areas of infarct. 4. Atherosclerotic changes within the cavernous carotid arteries bilaterally. 5. Mild to moderate atherosclerotic changes are present within the left A1 segment, the primary source of the anterior circulation. 6. Small vessel disease is likely exaggerated by patient motion.   Electronically Signed   By: Gennette Pac M.D.   On: 04/04/2013 21:05          Subjective: Patient is feeling well. Denies any fevers, chills, chest discomfort, shortness of breath, nausea, vomiting, diarrhea, abdominal pain. No dizziness or headache. No palpitations.  Objective: Filed Vitals:   04/08/13 0021 04/08/13 0125 04/08/13 0448 04/08/13 1431  BP: 167/73  178/98 153/86  Pulse: 95  108 88  Temp: 98.5 F (36.9 C)  97.7 F (36.5 C) 98.5 F (36.9 C)  TempSrc: Oral  Oral Oral  Resp: 18  18 17   Height:      Weight:      SpO2: 100% 99% 99% 99%    Intake/Output Summary (Last 24 hours) at 04/08/13 1538 Last data filed at 04/08/13 1432  Gross per 24 hour  Intake    800 ml  Output      0 ml  Net    800 ml   Weight change:  Exam:   General:  Pt is alert, follows commands appropriately, not in acute distress  HEENT: No icterus, No thrush,  [place]/AT  Cardiovascular: RRR, S1/S2, no rubs, no gallops  Respiratory: Bibasilar rales. No wheezing. Good air movement.  Abdomen: Soft/+BS, non tender, non distended, no guarding  Extremities: No edema, No lymphangitis, No petechiae, No rashes, no synovitis  Data Reviewed: Basic Metabolic Panel:  Recent Labs Lab 04/04/13 1710 04/04/13 1723 04/05/13 0820 04/06/13 0508 04/07/13 0350 04/08/13 0436  NA 132* 134* 134* 138 137 136*  K 4.1 3.8 3.6* 3.9 3.7 4.6  CL 92* 96 97 100 100 101  CO2 23  --  22 21 23 20   GLUCOSE 115* 112* 140* 116* 112* 105*  BUN 42* 40* 37* 28* 20 22  CREATININE 1.87* 2.00* 1.36* 1.13* 0.94 0.99  CALCIUM 8.8  --  8.4 8.5  8.3* 8.5  MG  --   --   --   --  1.3* 1.8   Liver Function Tests:  Recent Labs Lab 04/04/13 1710  AST 33  ALT 21  ALKPHOS 29*  BILITOT 0.6  PROT 7.3  ALBUMIN 3.8   No results found for this basename: LIPASE, AMYLASE,  in the last 168 hours No results found for this basename: AMMONIA,  in the last 168 hours CBC:  Recent Labs Lab 04/04/13 1710 04/04/13 1723 04/05/13 0820 04/06/13 0508  WBC 4.7  --  4.8 6.2  NEUTROABS 3.3  --   --    --   HGB 12.7 13.3 12.1 12.1  HCT 36.4 39.0 34.5* 35.0*  MCV 91.9  --  92.2 92.1  PLT 122*  --  103* 128*   Cardiac Enzymes:  Recent Labs Lab 04/04/13 1710  TROPONINI <0.30   BNP: No components found with this basename: POCBNP,  CBG:  Recent Labs Lab 04/05/13 1153 04/05/13 1636 04/05/13 2201 04/06/13 0728 04/06/13 1146  GLUCAP 99 121* 143* 130* 89    Recent Results (from the past 240 hour(s))  URINE CULTURE     Status: None   Collection Time    04/05/13 10:29 AM      Result Value Range Status   Specimen Description URINE, CLEAN CATCH   Final   Special Requests Normal   Final   Culture  Setup Time     Final   Value: 04/05/2013 14:28     Performed at [facility]   Colony Count     Final   Value: NO GROWTH     Performed at [facility]   Culture     Final   Value: NO GROWTH     Performed at [facility]   Report Status 04/06/2013 FINAL   Final     Scheduled Meds: . atorvastatin  20 mg Oral q1800  . clopidogrel  75 mg Oral Q breakfast  . colesevelam  1,875 mg Oral BID WC  . coumadin book   Does not apply Once  . diltiazem  120 mg Oral Q24H  . enoxaparin (LOVENOX) injection  40 mg Subcutaneous Q24H  . ipratropium-albuterol  3 mL Nebulization Q6H WA  . metoprolol tartrate  50 mg Oral BID  . sertraline  50 mg Oral q morning - 10a   Continuous Infusions:    Rehanna Oloughlin, DO  [facility] Pager [phone]  If 7PM-7AM, please contact night-coverage [email or web address] Password [email or web address] 04/08/2013, 3:38 PM   LOS: 4 days

## 2013-04-08 NOTE — Discharge Summary (Signed)
Physician Discharge Summary  Yvonne [name] ZOX:[id number] DOB: Jun 15, 1918 DOA: 04/04/2013  PCP: No primary provider on file.  Admit date: 04/04/2013 Discharge date: 04/09/13 Recommendations for Outpatient Follow-up:  1. Pt will need to follow up with PCP in 2 weeks post discharge 2. Please obtain BMP to evaluate electrolytes and kidney function 3. Please also check CBC to evaluate Hg and Hct levels  Discharge Diagnoses:  Active Problems:   Acute CVA (cerebrovascular accident)-nonhemorrhagic   Abnormal urinalysis   Dehydration   Acute renal failure   Orthostatic hypotension   Hypokalemia   Hypomagnesemia   Other specified cardiac dysrhythmias(427.89) Acute CVA (cerebrovascular accident)-nonhemorrhagic  -MR brain has revealed acute nonhemorrhagic infarcts in the right caudate region  -Neurology/stroke team appreciated  -Recommendations to discontinue aspirin in favor of Plavix  -Patient developed MAT 04/06/2013  -Bilateral carotid duplex with bilateral nonobstructive stenosis less than 60%  -2-D echocardiogram EF 55-60%, grade 1 diastolic dysfunction, mod MR  -PT/OT evaluation--recommended CIR but insurance will not pay  -LDL 70---> continue current dose of statin  -hemoglobin A1c--5.9  Dysrhythmia  -initally thought to be Afib-->reconfirmed as Sinus with PACs with short runs of MAT  -restart plavix  -Pt gave hx of dyspnea on exertion, with new stroke and abnormal EKG, -appreciate cardiology input--no further work up necessary -due to intermitten runs of MAT and continued elevated BP-->add cardizem  -continue metoprolol tartrate 50 mg twice a day -Patient will be discharged with diltiazem CD, 240 mg daily Dehydration/Orthostatic hypotension  -Improved with IV fluids  -Orthostatic vital signs obtained and patient's systolic blood pressure dropped into the 60s when standing  Acute renal failure  -Improved with IV fluids  -repeat am lab  -Baseline creatinine 1.0-1.2   -Presentation BUN 40 and creatinine 2.0  -Suspect combination of ACE inhibitor and diuretic contributed to acute renal failure (early ATN) -unclear if patient had poor oral intake recently  -ACE inhibitor with diuretic held at admission and will not be restarted -Serum creatinine 1.03 on the day of discharge  Hypokalemia  -Replete and follow lab  Hypomagnesemia  -Repleted  COPD -aerosolized albuterol and atroven -start dulera  Discharge Condition: stable  Disposition: skilled nursing facility  Diet:heart healthy Wt Readings from Last 3 Encounters:  04/04/13 78.382 kg (172 lb 12.8 oz)    History of present illness:  80 year old female with history of hypertension, dyslipidemia presents  with dizziness upon waking up on the morning of admission. No syncopal episode. The patient actually tripped and fell because she was imbalanced. Later during the course of the day, a family friend, who was also the POA noted that the patient had garbled speech  And that the patient kept repeating the same words. There was No unilateral weakness no headache. CT scan in the ED shows recent infarcts.  Serum creatinine also was elevated to 2.0 at time of admission.  There was no fever, chills, cp, sob, n/v/d. Neurology was consulted to see the patient. They recommended completion of the patient's stroke workup including MRI of the brain. MRI of the brain showed acute nonhemorrhagic infarct in the right caudate nucleus. MRA showed high-grade stenosis on the right M2 segment. Carotid Dopplers showed bilateral 40-59% stenosis. Echocardiogram showed EF 55-60% with grade 1 diastolic dysfunction. The patient was started on IV fluids. Her serum creatinine improved. Her mentation and confusion improved. Her dizziness also improved. The patient had episodes of chest congestion and intermittent wheezing. She was started on aerosolized albuterol and Atrovent. Her wheezing improved. She never had  any hypoxemia or shortness of  breath. Ambulatory pulse oximetry did not show any desaturation. The patient had episodes of tachycardia. She was placed on telemetry. Cardiology was consulted. The patient was felt to have short runs of multifocal atrial tachycardia. This was thought to be due to the patient's underlying COPD. The patient was started on aerosolized albuterol and Atrovent as well as a long-acting beta agonist. Cardizem was started in addition to the patient's metoprolol to control her heart rate. The patient was continued on her Plavix for secondary stroke prophylaxis. She was continued on her statin as well as WelChol.   Consultants: Cardiology  Discharge Exam: Filed Vitals:   04/09/13 0447  BP: 170/89  Pulse: 97  Temp: 98.2 F (36.8 C)  Resp: 19   Filed Vitals:   04/08/13 2126 04/08/13 2128 04/09/13 0022 04/09/13 0447  BP:  187/82 179/82 170/89  Pulse:  85  97  Temp:  98.2 F (36.8 C)  98.2 F (36.8 C)  TempSrc:  Oral  Oral  Resp:  20  19  Height:      Weight:      SpO2: 95%   98%   General: A&O x 3, NAD, pleasant, cooperative Cardiovascular: irregular, no rub, no gallop, no S3 Respiratory: Bibasilar scattered rales. no wheezes. Good air movement. Abdomen:soft, nontender, nondistended, positive bowel sounds Extremities: No edema, No lymphangitis, no petechiae  Discharge Instructions     Medication List    STOP taking these medications       aspirin 325 MG EC tablet     lisinopril-hydrochlorothiazide 10-12.5 MG per tablet  Commonly known as:  PRINZIDE,ZESTORETIC      TAKE these medications       albuterol 108 (90 BASE) MCG/ACT inhaler  Commonly known as:  PROVENTIL HFA;VENTOLIN HFA  Inhale 2 puffs into the lungs every 6 (six) hours as needed for wheezing or shortness of breath.     clopidogrel 75 MG tablet  Commonly known as:  PLAVIX  Take 1 tablet (75 mg total) by mouth daily with breakfast.     colesevelam 625 MG tablet  Commonly known as:  WELCHOL  Take 1,875 mg by  mouth 2 (two) times daily with a meal.     diltiazem 240 MG 24 hr capsule  Commonly known as:  CARDIZEM CD  Take 1 capsule (240 mg total) by mouth daily. Start 04/10/13  Start taking on:  04/10/2013     metoprolol 50 MG tablet  Commonly known as:  LOPRESSOR  Take 50 mg by mouth 2 (two) times daily.     mometasone-formoterol 200-5 MCG/ACT Aero  Commonly known as:  DULERA  Inhale 2 puffs into the lungs 2 (two) times daily.     sertraline 50 MG tablet  Commonly known as:  ZOLOFT  Take 50 mg by mouth every morning.     simvastatin 40 MG tablet  Commonly known as:  ZOCOR  Take 40 mg by mouth at bedtime.         The results of significant diagnostics from this hospitalization (including imaging, microbiology, ancillary and laboratory) are listed below for reference.    Significant Diagnostic Studies: Dg Chest 2 View  04/05/2013   CLINICAL DATA:  Cough, hypertension, hypercholesterolemia  EXAM: CHEST  2 VIEW  COMPARISON:  12/24/2010  FINDINGS: Enlargement of cardiac silhouette.  Calcified tortuous aorta.  Pulmonary vascularity normal.  Lungs are emphysematous but clear.  No pleural effusion or pneumothorax.  Posttraumatic deformities of multiple lateral mid right  ribs.  Mild osseous demineralization.  Surgical clips in right cervical region.  IMPRESSION: Enlargement of cardiac silhouette.  Probable emphysematous changes.   Electronically Signed   By: Ulyses Southward M.D.   On: 04/05/2013 16:53   Ct Head Wo Contrast  04/04/2013   CLINICAL DATA:  Altered mental status  EXAM: CT HEAD WITHOUT CONTRAST  TECHNIQUE: Contiguous axial images were obtained from the base of the skull through the vertex without intravenous contrast. Study was obtained within 24 hr of patient's arrival at the emergency department.  COMPARISON:  Brain CT and brain MRI November 01, 2012  FINDINGS: Mild diffuse atrophy is stable. There is no mass effect, hemorrhage, extra-axial fluid collection, or midline shift.  There is a new  area of decreased attenuation in the right globus pallidum and anterior limb of the right internal capsule. There is also new decreased attenuation in the head of the caudate nucleus on the right. There is an old prior infarct in the medial left pons. There is also evidence of prior small infarct in the left thalamus, better seen on prior MR. There is mild periventricular small vessel disease.  Calcification in the mid cerebellar hemispheres bilaterally is stable and felt to be physiologic. There is also mild basal ganglia calcification, felt to be physiologic.  Bony calvarium appears intact.  The mastoid air cells are clear.  IMPRESSION: Evidence of recent infarcts in the head of the caudate nucleus on the right with extension into the anterior limb of the right internal capsule and medial right globus pallidum.  Prior small infarcts as noted above. There is mild atrophy with periventricular small vessel disease. Basal ganglia and cerebellar calcifications are stable and felt to most likely be physiologic in etiology.   Electronically Signed   By: Bretta Bang M.D.   On: 04/04/2013 17:44   Mr [name] Head Wo Contrast  04/04/2013   CLINICAL DATA:  Slurred speech, disorientation, and confusion beginning this afternoon. Question stroke.  EXAM: MRI HEAD WITHOUT CONTRAST  MRA HEAD WITHOUT CONTRAST  TECHNIQUE: Multiplanar, multiecho pulse sequences of the brain and surrounding structures were obtained without intravenous contrast. Angiographic images of the head were obtained using MRA technique without contrast.  COMPARISON:  CT head without contrast 04/04/2013. MRI brain 11/01/2012.  FINDINGS: MRI HEAD FINDINGS  Acute nonhemorrhagic infarcts are confirmed in the right caudate head and anterior limb internal capsule. No additional infarcts are present.  T2 signal hyperintensity is associated with the infarcts. Moderate atrophy and periventricular white matter disease is otherwise stable. The ventricles are  proportionate to the degree of atrophy. No significant extra-axial fluid collection is present. Flow is present in the major intracranial arteries. The patient is status post bilateral lens extractions. Mild mucosal thickening is noted in the inferior frontal sinuses, the anterior ethmoid air cells, the inferior maxillary sinuses and throughout the sphenoid sinuses. The mastoid air cells are clear.  MRA HEAD FINDINGS  The time-of-flight images a performed twice. The images are degraded by patient motion.  The internal carotid arteries demonstrate mild irregularity in the cavernous segments bilaterally, right greater than left. There is no definite stenosis greater than 50%. The right A1 segment is aplastic. Mild to moderate right irregularity is present in the left A1 segment. Both A2 segments are visualized although the anterior communicating artery is incompletely seen. The right MCA bifurcation is intact. The superior division is occluded proximally. A left MCA bifurcation is intact. Moderate small vessel disease is evident.  The right vertebral artery  is slightly dominant to the left. The PICA origins are just below the field of view. The basilar artery is within normal limits. Both posterior cerebral arteries originate from the basilar tip. There is moderate attenuation of branch vessels.  IMPRESSION: 1. Acute nonhemorrhagic infarcts within the right caudate head and anterior limb internal capsule are confirmed. 2. Moderate generalized atrophy and mild periventricular white matter disease likely reflects the sequela of chronic microvascular ischemia. 3. Occluded vessel versus high-grade stenosis superior right M2 division is distal to the areas of infarct. 4. Atherosclerotic changes within the cavernous carotid arteries bilaterally. 5. Mild to moderate atherosclerotic changes are present within the left A1 segment, the primary source of the anterior circulation. 6. Small vessel disease is likely exaggerated by  patient motion.   Electronically Signed   By: Gennette Pac M.D.   On: 04/04/2013 21:05   Mr Brain Wo Contrast  04/04/2013   CLINICAL DATA:  Slurred speech, disorientation, and confusion beginning this afternoon. Question stroke.  EXAM: MRI HEAD WITHOUT CONTRAST  MRA HEAD WITHOUT CONTRAST  TECHNIQUE: Multiplanar, multiecho pulse sequences of the brain and surrounding structures were obtained without intravenous contrast. Angiographic images of the head were obtained using MRA technique without contrast.  COMPARISON:  CT head without contrast 04/04/2013. MRI brain 11/01/2012.  FINDINGS: MRI HEAD FINDINGS  Acute nonhemorrhagic infarcts are confirmed in the right caudate head and anterior limb internal capsule. No additional infarcts are present.  T2 signal hyperintensity is associated with the infarcts. Moderate atrophy and periventricular white matter disease is otherwise stable. The ventricles are proportionate to the degree of atrophy. No significant extra-axial fluid collection is present. Flow is present in the major intracranial arteries. The patient is status post bilateral lens extractions. Mild mucosal thickening is noted in the inferior frontal sinuses, the anterior ethmoid air cells, the inferior maxillary sinuses and throughout the sphenoid sinuses. The mastoid air cells are clear.  MRA HEAD FINDINGS  The time-of-flight images a performed twice. The images are degraded by patient motion.  The internal carotid arteries demonstrate mild irregularity in the cavernous segments bilaterally, right greater than left. There is no definite stenosis greater than 50%. The right A1 segment is aplastic. Mild to moderate right irregularity is present in the left A1 segment. Both A2 segments are visualized although the anterior communicating artery is incompletely seen. The right MCA bifurcation is intact. The superior division is occluded proximally. A left MCA bifurcation is intact. Moderate small vessel disease is  evident.  The right vertebral artery is slightly dominant to the left. The PICA origins are just below the field of view. The basilar artery is within normal limits. Both posterior cerebral arteries originate from the basilar tip. There is moderate attenuation of branch vessels.  IMPRESSION: 1. Acute nonhemorrhagic infarcts within the right caudate head and anterior limb internal capsule are confirmed. 2. Moderate generalized atrophy and mild periventricular white matter disease likely reflects the sequela of chronic microvascular ischemia. 3. Occluded vessel versus high-grade stenosis superior right M2 division is distal to the areas of infarct. 4. Atherosclerotic changes within the cavernous carotid arteries bilaterally. 5. Mild to moderate atherosclerotic changes are present within the left A1 segment, the primary source of the anterior circulation. 6. Small vessel disease is likely exaggerated by patient motion.   Electronically Signed   By: Gennette Pac M.D.   On: 04/04/2013 21:05     Microbiology: Recent Results (from the past 240 hour(s))  URINE CULTURE     Status:  None   Collection Time    04/05/13 10:29 AM      Result Value Range Status   Specimen Description URINE, CLEAN CATCH   Final   Special Requests Normal   Final   Culture  Setup Time     Final   Value: 04/05/2013 14:28     Performed at [facility]   Colony Count     Final   Value: NO GROWTH     Performed at [facility]   Culture     Final   Value: NO GROWTH     Performed at [facility]   Report Status 04/06/2013 FINAL   Final     Labs: Basic Metabolic Panel:  Recent Labs Lab 04/05/13 0820 04/06/13 0508 04/07/13 0350 04/08/13 0436 04/09/13 0458  NA 134* 138 137 136* 139  K 3.6* 3.9 3.7 4.6 4.1  CL 97 100 100 101 101  CO2 22 21 23 20 22   GLUCOSE 140* 116* 112* 105* 102*  BUN 37* 28* 20 22 21   CREATININE 1.36* 1.13* 0.94 0.99 1.03  CALCIUM 8.4 8.5 8.3* 8.5 8.5  MG  --   --  1.3* 1.8  1.6   Liver Function Tests:  Recent Labs Lab 04/04/13 1710  AST 33  ALT 21  ALKPHOS 29*  BILITOT 0.6  PROT 7.3  ALBUMIN 3.8   No results found for this basename: LIPASE, AMYLASE,  in the last 168 hours No results found for this basename: AMMONIA,  in the last 168 hours CBC:  Recent Labs Lab 04/04/13 1710 04/04/13 1723 04/05/13 0820 04/06/13 0508  WBC 4.7  --  4.8 6.2  NEUTROABS 3.3  --   --   --   HGB 12.7 13.3 12.1 12.1  HCT 36.4 39.0 34.5* 35.0*  MCV 91.9  --  92.2 92.1  PLT 122*  --  103* 128*   Cardiac Enzymes:  Recent Labs Lab 04/04/13 1710  TROPONINI <0.30   BNP: No components found with this basename: POCBNP,  CBG:  Recent Labs Lab 04/05/13 1153 04/05/13 1636 04/05/13 2201 04/06/13 0728 04/06/13 1146  GLUCAP 99 121* 143* 130* 89    Time coordinating discharge:  Greater than 30 minutes  Signed:  Jewelia Bocchino, DO [facility] Pager: [phone] 04/09/2013, 7:52 AM

## 2013-04-09 LAB — BASIC METABOLIC PANEL
BUN: 21 mg/dL (ref 6–23)
CHLORIDE: 101 meq/L (ref 96–112)
CO2: 22 meq/L (ref 19–32)
Calcium: 8.5 mg/dL (ref 8.4–10.5)
Creatinine, Ser: 1.03 mg/dL (ref 0.50–1.10)
GFR calc non Af Amer: 46 mL/min — ABNORMAL LOW (ref >90)
GFR, EST AFRICAN AMERICAN: 53 mL/min — AB (ref >90)
Glucose, Bld: 102 mg/dL — ABNORMAL HIGH (ref 70–99)
Potassium: 4.1 mEq/L (ref 3.7–5.3)
SODIUM: 139 meq/L (ref 137–147)

## 2013-04-09 LAB — MAGNESIUM: MAGNESIUM: 1.6 mg/dL (ref 1.5–2.5)

## 2013-04-09 LAB — PROTIME-INR
INR: 1.03 (ref 0.00–1.49)
PROTHROMBIN TIME: 13.3 s (ref 11.6–15.2)

## 2013-04-09 MED ORDER — MOMETASONE FURO-FORMOTEROL FUM 200-5 MCG/ACT IN AERO: 2.0000 | INHALATION_SPRAY | Freq: Two times a day (BID) | RESPIRATORY_TRACT | Status: DC

## 2013-04-09 MED ORDER — DILTIAZEM HCL ER COATED BEADS 120 MG PO CP24: 120.0000 mg | ORAL_CAPSULE | Freq: Every day | ORAL | Status: DC

## 2013-04-09 MED ORDER — ALBUTEROL SULFATE HFA 108 (90 BASE) MCG/ACT IN AERS: 2.0000 | INHALATION_SPRAY | Freq: Four times a day (QID) | RESPIRATORY_TRACT | Status: DC | PRN

## 2013-04-09 MED ORDER — CLOPIDOGREL BISULFATE 75 MG PO TABS: 75.0000 mg | ORAL_TABLET | Freq: Every day | ORAL | Status: DC

## 2013-04-09 MED ORDER — DILTIAZEM HCL ER COATED BEADS 240 MG PO CP24: 240.0000 mg | ORAL_CAPSULE | Freq: Every day | ORAL | Status: DC

## 2013-04-09 MED ORDER — DILTIAZEM HCL ER COATED BEADS 120 MG PO CP24
120.0000 mg | ORAL_CAPSULE | Freq: Once | ORAL | Status: AC
  Administered 2013-04-09: 10:00:00 120 mg via ORAL
  Filled 2013-04-09 (×2): qty 1

## 2013-04-09 NOTE — Progress Notes (Signed)
[facility]. Patient aware. Discharge packet given to RN, Melissa to call for report. Patient's friend to transport her to SNF.   Clinical Social Work Department CLINICAL SOCIAL WORK PLACEMENT NOTE 04/09/2013  Patient:  Yvonne [name],Yvonne [name]  Account Number:  [id number] Admit date:  04/04/2013  Clinical Social Worker:  Orpah GreekKELLY [name], LCSWA  Date/time:  04/06/2013 02:52 PM  Clinical Social Work is seeking post-discharge placement for this patient at the following level of care:   SKILLED NURSING   (*CSW will update this form in Epic as items are completed)   04/06/2013  Patient/family provided with [facility] Department of Clinical Social Work's list of facilities offering this level of care within the geographic area requested by the patient (or if unable, by the patient's family).  04/06/2013  Patient/family informed of their freedom to choose among providers that offer the needed level of care, that participate in Medicare, Medicaid or managed care program needed by the patient, have an available bed and are willing to accept the patient.  04/06/2013  Patient/family informed of [facility]' ownership interest in [facility], as well as of the fact that they are under no obligation to receive care at this facility.  PASARR submitted to EDS on 04/06/2013 PASARR number received from EDS on 04/06/2013  FL2 transmitted to all facilities in geographic area requested by pt/family on  04/06/2013 FL2 transmitted to all facilities within larger geographic area on   Patient informed that his/her managed care company has contracts with or will negotiate with  certain facilities, including the following:     Patient/family informed of bed offers received:  04/06/2013 [facility] Physician recommends and patient chooses bed at    [facility]   04/09/2013 Patient to be transferred to facility by friend's car  The following physician request were entered in Epic:   Additional Comments:   Yvonne BaileyKelly Foley, LCSW [facility] Clinical Social Worker cell #: [phone]

## 2013-04-09 NOTE — Progress Notes (Signed)
Report called to [facility] and given to Antionette. Pt to be transported to SNF via family friend. SW and SNF aware.

## 2013-04-09 NOTE — Progress Notes (Signed)
Pt had 5 beats of V. Tach on heart monitor. Asymptomatic, VSS. Yvonne [name]. Kirby, NP notified.  Will continue to monitor. [name], [name] [place]

## 2014-06-12 ENCOUNTER — Inpatient Hospital Stay (HOSPITAL_COMMUNITY)
Admission: EM | Admit: 2014-06-12 | Discharge: 2014-06-14 | DRG: 511 | Disposition: A | Discharge status: Skilled Nursing Facility | Payer: Medicare Other | Attending: General Surgery | Admitting: General Surgery

## 2014-06-12 ENCOUNTER — Encounter (HOSPITAL_COMMUNITY): Payer: Self-pay | Admitting: Emergency Medicine

## 2014-06-12 ENCOUNTER — Emergency Department (HOSPITAL_COMMUNITY): Payer: Medicare Other

## 2014-06-12 ENCOUNTER — Encounter (HOSPITAL_COMMUNITY): Admission: EM | Disposition: A | Discharge status: Skilled Nursing Facility | Payer: Self-pay | Source: Home / Self Care

## 2014-06-12 ENCOUNTER — Inpatient Hospital Stay (HOSPITAL_COMMUNITY): Payer: Medicare Other | Admitting: Certified Registered Nurse Anesthetist

## 2014-06-12 DIAGNOSIS — Z7902 Long term (current) use of antithrombotics/antiplatelets: Secondary | ICD-10-CM

## 2014-06-12 DIAGNOSIS — W010XXA Fall on same level from slipping, tripping and stumbling without subsequent striking against object, initial encounter: Secondary | ICD-10-CM | POA: Diagnosis present

## 2014-06-12 DIAGNOSIS — F0390 Unspecified dementia without behavioral disturbance: Secondary | ICD-10-CM | POA: Diagnosis present

## 2014-06-12 DIAGNOSIS — S80212A Abrasion, left knee, initial encounter: Secondary | ICD-10-CM | POA: Diagnosis present

## 2014-06-12 DIAGNOSIS — Z79899 Other long term (current) drug therapy: Secondary | ICD-10-CM

## 2014-06-12 DIAGNOSIS — S62109A Fracture of unspecified carpal bone, unspecified wrist, initial encounter for closed fracture: Secondary | ICD-10-CM | POA: Diagnosis present

## 2014-06-12 DIAGNOSIS — I10 Essential (primary) hypertension: Secondary | ICD-10-CM | POA: Diagnosis present

## 2014-06-12 DIAGNOSIS — S2232XA Fracture of one rib, left side, initial encounter for closed fracture: Secondary | ICD-10-CM | POA: Diagnosis present

## 2014-06-12 DIAGNOSIS — Z8673 Personal history of transient ischemic attack (TIA), and cerebral infarction without residual deficits: Secondary | ICD-10-CM | POA: Diagnosis not present

## 2014-06-12 DIAGNOSIS — Y93K1 Activity, walking an animal: Secondary | ICD-10-CM | POA: Diagnosis not present

## 2014-06-12 DIAGNOSIS — Z8249 Family history of ischemic heart disease and other diseases of the circulatory system: Secondary | ICD-10-CM

## 2014-06-12 DIAGNOSIS — S52572A Other intraarticular fracture of lower end of left radius, initial encounter for closed fracture: Secondary | ICD-10-CM | POA: Diagnosis present

## 2014-06-12 DIAGNOSIS — E785 Hyperlipidemia, unspecified: Secondary | ICD-10-CM | POA: Diagnosis present

## 2014-06-12 DIAGNOSIS — W19XXXA Unspecified fall, initial encounter: Secondary | ICD-10-CM

## 2014-06-12 DIAGNOSIS — S52612A Displaced fracture of left ulna styloid process, initial encounter for closed fracture: Secondary | ICD-10-CM | POA: Diagnosis present

## 2014-06-12 DIAGNOSIS — Z87891 Personal history of nicotine dependence: Secondary | ICD-10-CM | POA: Diagnosis not present

## 2014-06-12 DIAGNOSIS — S52502A Unspecified fracture of the lower end of left radius, initial encounter for closed fracture: Secondary | ICD-10-CM | POA: Diagnosis present

## 2014-06-12 HISTORY — DX: Transient cerebral ischemic attack, unspecified: G45.9

## 2014-06-12 HISTORY — DX: Acute kidney failure, unspecified: N17.9

## 2014-06-12 HISTORY — PX: ORIF WRIST FRACTURE: SHX2133

## 2014-06-12 LAB — I-STAT CHEM 8, ED
BUN: 28 mg/dL — AB (ref 6–23)
CALCIUM ION: 1.12 mmol/L — AB (ref 1.13–1.30)
CHLORIDE: 99 mmol/L (ref 96–112)
Creatinine, Ser: 1.3 mg/dL — ABNORMAL HIGH (ref 0.50–1.10)
Glucose, Bld: 117 mg/dL — ABNORMAL HIGH (ref 70–99)
HCT: 38 % (ref 36.0–46.0)
Hemoglobin: 12.9 g/dL (ref 12.0–15.0)
Potassium: 4.2 mmol/L (ref 3.5–5.1)
Sodium: 137 mmol/L (ref 135–145)
TCO2: 23 mmol/L (ref 0–100)

## 2014-06-12 LAB — BASIC METABOLIC PANEL
Anion gap: 11 (ref 5–15)
BUN: 29 mg/dL — AB (ref 6–23)
CALCIUM: 9.4 mg/dL (ref 8.4–10.5)
CO2: 26 mmol/L (ref 19–32)
Chloride: 101 mmol/L (ref 96–112)
Creatinine, Ser: 1.33 mg/dL — ABNORMAL HIGH (ref 0.50–1.10)
GFR, EST AFRICAN AMERICAN: 39 mL/min — AB (ref >90)
GFR, EST NON AFRICAN AMERICAN: 33 mL/min — AB (ref >90)
GLUCOSE: 115 mg/dL — AB (ref 70–99)
POTASSIUM: 5.1 mmol/L (ref 3.5–5.1)
SODIUM: 138 mmol/L (ref 135–145)

## 2014-06-12 LAB — CBC WITH DIFFERENTIAL/PLATELET
BASOS ABS: 0 10*3/uL (ref 0.0–0.1)
BASOS PCT: 0 % (ref 0–1)
EOS PCT: 1 % (ref 0–5)
Eosinophils Absolute: 0.1 10*3/uL (ref 0.0–0.7)
HCT: 37.5 % (ref 36.0–46.0)
HEMOGLOBIN: 12.6 g/dL (ref 12.0–15.0)
Lymphocytes Relative: 12 % (ref 12–46)
Lymphs Abs: 0.9 10*3/uL (ref 0.7–4.0)
MCH: 31.3 pg (ref 26.0–34.0)
MCHC: 33.6 g/dL (ref 30.0–36.0)
MCV: 93.1 fL (ref 78.0–100.0)
MONO ABS: 0.6 10*3/uL (ref 0.1–1.0)
Monocytes Relative: 8 % (ref 3–12)
NEUTROS PCT: 79 % — AB (ref 43–77)
Neutro Abs: 6.1 10*3/uL (ref 1.7–7.7)
Platelets: 130 10*3/uL — ABNORMAL LOW (ref 150–400)
RBC: 4.03 MIL/uL (ref 3.87–5.11)
RDW: 12.9 % (ref 11.5–15.5)
WBC: 7.8 10*3/uL (ref 4.0–10.5)

## 2014-06-12 LAB — PROTIME-INR
INR: 1.11 (ref 0.00–1.49)
Prothrombin Time: 14.4 seconds (ref 11.6–15.2)

## 2014-06-12 LAB — SURGICAL PCR SCREEN
MRSA, PCR: NEGATIVE
Staphylococcus aureus: POSITIVE — AB

## 2014-06-12 LAB — TYPE AND SCREEN
ABO/RH(D): A POS
Antibody Screen: NEGATIVE

## 2014-06-12 SURGERY — OPEN REDUCTION INTERNAL FIXATION (ORIF) WRIST FRACTURE
Anesthesia: Monitor Anesthesia Care | Site: Arm Lower | Laterality: Left

## 2014-06-12 MED ORDER — DEXMEDETOMIDINE HCL IN NACL 200 MCG/50ML IV SOLN
INTRAVENOUS | Status: DC | PRN
  Administered 2014-06-12: 0.4 ug/kg/h via INTRAVENOUS

## 2014-06-12 MED ORDER — FENTANYL CITRATE 0.05 MG/ML IJ SOLN: 25.0000 ug | INTRAMUSCULAR | Status: DC | PRN

## 2014-06-12 MED ORDER — BUPIVACAINE HCL (PF) 0.25 % IJ SOLN
INTRAMUSCULAR | Status: AC
  Filled 2014-06-12: qty 30

## 2014-06-12 MED ORDER — KCL IN DEXTROSE-NACL 20-5-0.45 MEQ/L-%-% IV SOLN
INTRAVENOUS | Status: DC
  Administered 2014-06-13: 02:00:00 via INTRAVENOUS
  Filled 2014-06-12 (×3): qty 1000

## 2014-06-12 MED ORDER — METOPROLOL TARTRATE 1 MG/ML IV SOLN
INTRAVENOUS | Status: DC | PRN
  Administered 2014-06-12 (×2): 1 mg via INTRAVENOUS

## 2014-06-12 MED ORDER — ONDANSETRON HCL 4 MG PO TABS: 4.0000 mg | ORAL_TABLET | Freq: Four times a day (QID) | ORAL | Status: DC | PRN

## 2014-06-12 MED ORDER — HYDROMORPHONE HCL 1 MG/ML IJ SOLN
0.5000 mg | INTRAMUSCULAR | Status: DC | PRN
  Administered 2014-06-13 – 2014-06-14 (×8): 0.5 mg via INTRAVENOUS
  Filled 2014-06-12 (×7): qty 1

## 2014-06-12 MED ORDER — 0.9 % SODIUM CHLORIDE (POUR BTL) OPTIME
TOPICAL | Status: DC | PRN
  Administered 2014-06-12: 1000 mL

## 2014-06-12 MED ORDER — ONDANSETRON HCL 4 MG/2ML IJ SOLN: 4.0000 mg | Freq: Once | INTRAMUSCULAR | Status: DC | PRN

## 2014-06-12 MED ORDER — PHENYLEPHRINE HCL 10 MG/ML IJ SOLN
INTRAMUSCULAR | Status: DC | PRN
  Administered 2014-06-12 (×2): 40 ug via INTRAVENOUS

## 2014-06-12 MED ORDER — ONDANSETRON HCL 4 MG/2ML IJ SOLN: 4.0000 mg | Freq: Three times a day (TID) | INTRAMUSCULAR | Status: DC | PRN

## 2014-06-12 MED ORDER — HYDROCODONE-ACETAMINOPHEN 5-325 MG PO TABS
1.0000 | ORAL_TABLET | ORAL | Status: AC
  Administered 2014-06-12: 1 via ORAL
  Filled 2014-06-12: qty 1

## 2014-06-12 MED ORDER — MOMETASONE FURO-FORMOTEROL FUM 200-5 MCG/ACT IN AERO
2.0000 | INHALATION_SPRAY | Freq: Two times a day (BID) | RESPIRATORY_TRACT | Status: DC
  Administered 2014-06-13 (×2): 2 via RESPIRATORY_TRACT
  Filled 2014-06-12: qty 8.8

## 2014-06-12 MED ORDER — LACTATED RINGERS IV SOLN
INTRAVENOUS | Status: DC | PRN
  Administered 2014-06-12: 20:00:00 via INTRAVENOUS

## 2014-06-12 MED ORDER — ONDANSETRON HCL 4 MG/2ML IJ SOLN: 4.0000 mg | Freq: Four times a day (QID) | INTRAMUSCULAR | Status: DC | PRN

## 2014-06-12 MED ORDER — HEPARIN SODIUM (PORCINE) 5000 UNIT/ML IJ SOLN
5000.0000 [IU] | Freq: Three times a day (TID) | INTRAMUSCULAR | Status: DC
  Administered 2014-06-13 – 2014-06-14 (×4): 5000 [IU] via SUBCUTANEOUS
  Filled 2014-06-12 (×2): qty 1

## 2014-06-12 MED ORDER — BUPIVACAINE HCL (PF) 0.25 % IJ SOLN
INTRAMUSCULAR | Status: DC | PRN
  Administered 2014-06-12: 30 mL

## 2014-06-12 MED ORDER — FENTANYL CITRATE 0.05 MG/ML IJ SOLN
INTRAMUSCULAR | Status: DC | PRN
  Administered 2014-06-12 (×2): 25 ug via INTRAVENOUS
  Administered 2014-06-12: 50 ug via INTRAVENOUS
  Administered 2014-06-12: 25 ug via INTRAVENOUS

## 2014-06-12 MED ORDER — HYDROMORPHONE HCL 1 MG/ML IJ SOLN: 1.0000 mg | INTRAMUSCULAR | Status: DC | PRN

## 2014-06-12 MED ORDER — CEFAZOLIN SODIUM-DEXTROSE 2-3 GM-% IV SOLR
INTRAVENOUS | Status: DC | PRN
  Administered 2014-06-12: 2 g via INTRAVENOUS

## 2014-06-12 MED ORDER — MORPHINE SULFATE 2 MG/ML IJ SOLN: 1.0000 mg | INTRAMUSCULAR | Status: DC | PRN

## 2014-06-12 MED ORDER — SODIUM CHLORIDE 0.9 % IV SOLN: INTRAVENOUS | Status: AC

## 2014-06-12 MED ORDER — FENTANYL CITRATE 0.05 MG/ML IJ SOLN
INTRAMUSCULAR | Status: AC
  Filled 2014-06-12: qty 5

## 2014-06-12 MED ORDER — PROPOFOL 10 MG/ML IV BOLUS
INTRAVENOUS | Status: DC | PRN
  Administered 2014-06-12: 130 mg via INTRAVENOUS

## 2014-06-12 MED ORDER — MORPHINE SULFATE 4 MG/ML IJ SOLN
4.0000 mg | Freq: Once | INTRAMUSCULAR | Status: AC
  Administered 2014-06-12: 4 mg via INTRAVENOUS
  Filled 2014-06-12: qty 1

## 2014-06-12 MED ORDER — ALBUTEROL SULFATE (2.5 MG/3ML) 0.083% IN NEBU: 3.0000 mL | INHALATION_SOLUTION | Freq: Four times a day (QID) | RESPIRATORY_TRACT | Status: DC | PRN

## 2014-06-12 MED ORDER — ONDANSETRON HCL 4 MG/2ML IJ SOLN
INTRAMUSCULAR | Status: DC | PRN
  Administered 2014-06-12: 4 mg via INTRAVENOUS

## 2014-06-12 MED ORDER — CEFAZOLIN SODIUM 1-5 GM-% IV SOLN
1.0000 g | Freq: Three times a day (TID) | INTRAVENOUS | Status: AC
  Administered 2014-06-13 (×3): 1 g via INTRAVENOUS
  Filled 2014-06-12 (×4): qty 50

## 2014-06-12 MED ORDER — HYDROCODONE-ACETAMINOPHEN 5-325 MG PO TABS
1.0000 | ORAL_TABLET | Freq: Once | ORAL | Status: AC
  Administered 2014-06-12: 1 via ORAL
  Filled 2014-06-12: qty 1

## 2014-06-12 MED ORDER — METOPROLOL TARTRATE 1 MG/ML IV SOLN
5.0000 mg | Freq: Four times a day (QID) | INTRAVENOUS | Status: DC
  Administered 2014-06-13: 5 mg via INTRAVENOUS

## 2014-06-12 SURGICAL SUPPLY — 68 items
BANDAGE ELASTIC 3 VELCRO ST LF (GAUZE/BANDAGES/DRESSINGS) ×3 IMPLANT
BANDAGE ELASTIC 4 VELCRO ST LF (GAUZE/BANDAGES/DRESSINGS) ×3 IMPLANT
BIT DRILL 2.5X4 QC (BIT) ×2 IMPLANT
BLADE SURG ROTATE 9660 (MISCELLANEOUS) IMPLANT
BNDG CMPR 9X4 STRL LF SNTH (GAUZE/BANDAGES/DRESSINGS) ×1
BNDG ESMARK 4X9 LF (GAUZE/BANDAGES/DRESSINGS) ×3 IMPLANT
BNDG GAUZE ELAST 4 BULKY (GAUZE/BANDAGES/DRESSINGS) ×5 IMPLANT
CORDS BIPOLAR (ELECTRODE) ×3 IMPLANT
COVER SURGICAL LIGHT HANDLE (MISCELLANEOUS) ×3 IMPLANT
CUFF TOURNIQUET SINGLE 18IN (TOURNIQUET CUFF) ×3 IMPLANT
CUFF TOURNIQUET SINGLE 24IN (TOURNIQUET CUFF) IMPLANT
DRAIN TLS ROUND 10FR (DRAIN) IMPLANT
DRAPE OEC MINIVIEW 54X84 (DRAPES) ×3 IMPLANT
DRAPE SURG 17X11 SM STRL (DRAPES) ×3 IMPLANT
DRSG ADAPTIC 3X8 NADH LF (GAUZE/BANDAGES/DRESSINGS) ×3 IMPLANT
ELECT REM PT RETURN 9FT ADLT (ELECTROSURGICAL)
ELECTRODE REM PT RTRN 9FT ADLT (ELECTROSURGICAL) IMPLANT
GAUZE SPONGE 4X4 12PLY STRL (GAUZE/BANDAGES/DRESSINGS) ×3 IMPLANT
GAUZE XEROFORM 5X9 LF (GAUZE/BANDAGES/DRESSINGS) ×2 IMPLANT
GLOVE BIOGEL PI IND STRL 7.5 (GLOVE) IMPLANT
GLOVE BIOGEL PI IND STRL 8.5 (GLOVE) ×1 IMPLANT
GLOVE BIOGEL PI INDICATOR 7.5 (GLOVE) ×2
GLOVE BIOGEL PI INDICATOR 8.5 (GLOVE) ×2
GLOVE SURG ORTHO 8.0 STRL STRW (GLOVE) ×3 IMPLANT
GLOVE SURG SS PI 7.0 STRL IVOR (GLOVE) ×2 IMPLANT
GOWN STRL REUS W/ TWL LRG LVL3 (GOWN DISPOSABLE) ×3 IMPLANT
GOWN STRL REUS W/ TWL XL LVL3 (GOWN DISPOSABLE) ×1 IMPLANT
GOWN STRL REUS W/TWL LRG LVL3 (GOWN DISPOSABLE) ×9
GOWN STRL REUS W/TWL XL LVL3 (GOWN DISPOSABLE) ×3
K-WIRE 1.6 (WIRE) ×6
K-WIRE FX5X1.6XNS BN SS (WIRE) ×2
KIT BASIN OR (CUSTOM PROCEDURE TRAY) ×3 IMPLANT
KIT ROOM TURNOVER OR (KITS) ×3 IMPLANT
KWIRE FX5X1.6XNS BN SS (WIRE) IMPLANT
MANIFOLD NEPTUNE II (INSTRUMENTS) ×3 IMPLANT
NDL HYPO 25X1 1.5 SAFETY (NEEDLE) ×1 IMPLANT
NEEDLE HYPO 25X1 1.5 SAFETY (NEEDLE) ×3 IMPLANT
NS IRRIG 1000ML POUR BTL (IV SOLUTION) ×3 IMPLANT
PACK ORTHO EXTREMITY (CUSTOM PROCEDURE TRAY) ×3 IMPLANT
PAD ARMBOARD 7.5X6 YLW CONV (MISCELLANEOUS) ×6 IMPLANT
PAD CAST 4YDX4 CTTN HI CHSV (CAST SUPPLIES) ×1 IMPLANT
PADDING CAST COTTON 4X4 STRL (CAST SUPPLIES) ×3
PEG LOCKING SMOOTH 2.2X18 (Peg) ×8 IMPLANT
PEG LOCKING SMOOTH 2.2X20 (Screw) ×4 IMPLANT
PLATE STANDARD DVR LEFT (Plate) ×3 IMPLANT
PLATE STD DVR LT 24X51 (Plate) IMPLANT
SCREW LOCK 14X2.7X 3 LD TPR (Screw) IMPLANT
SCREW LOCK 16X2.7X 3 LD TPR (Screw) IMPLANT
SCREW LOCKING 2.7X14 (Screw) ×9 IMPLANT
SCREW LOCKING 2.7X16 (Screw) ×6 IMPLANT
SCREW MULTI DIRECTIONAL 2.7X16 (Screw) ×2 IMPLANT
SOAP 2 % CHG 4 OZ (WOUND CARE) ×3 IMPLANT
SPLINT FIBERGLASS 3X35 (CAST SUPPLIES) ×2 IMPLANT
SUCTION FRAZIER TIP 8 FR DISP (SUCTIONS) ×2
SUCTION TUBE FRAZIER 8FR DISP (SUCTIONS) IMPLANT
SUT PROLENE 4 0 PS 2 18 (SUTURE) ×3 IMPLANT
SUT VIC AB 2-0 CT1 27 (SUTURE) ×3
SUT VIC AB 2-0 CT1 TAPERPNT 27 (SUTURE) IMPLANT
SUT VIC AB 2-0 FS1 27 (SUTURE) ×3 IMPLANT
SUT VICRYL 4-0 PS2 18IN ABS (SUTURE) ×3 IMPLANT
SUT VICRYL RAPIDE 4/0 PS 2 (SUTURE) ×2 IMPLANT
SYR CONTROL 10ML LL (SYRINGE) ×2 IMPLANT
SYSTEM CHEST DRAIN TLS 7FR (DRAIN) IMPLANT
TOWEL OR 17X24 6PK STRL BLUE (TOWEL DISPOSABLE) ×3 IMPLANT
TOWEL OR 17X26 10 PK STRL BLUE (TOWEL DISPOSABLE) ×3 IMPLANT
TUBE CONNECTING 12'X1/4 (SUCTIONS) ×1
TUBE CONNECTING 12X1/4 (SUCTIONS) ×2 IMPLANT
WATER STERILE IRR 1000ML POUR (IV SOLUTION) ×3 IMPLANT

## 2014-06-12 NOTE — Brief Op Note (Signed)
06/12/2014  8:19 PM  PATIENT:  Yvonne [name]  81 y.o. female  PRE-OPERATIVE DIAGNOSIS:  left wrist fracture  POST-OPERATIVE DIAGNOSIS:  * No post-op diagnosis entered *  PROCEDURE:  Procedure(s): OPEN REDUCTION INTERNAL FIXATION (ORIF) WRIST FRACTURE (Left)  SURGEON:  Surgeon(s) and Role:    * Bradly BienenstockFred Raychel Dowler, MD - Primary  PHYSICIAN ASSISTANT:   ASSISTANTS: none   ANESTHESIA:   general  EBL:     BLOOD ADMINISTERED:none  DRAINS: none   LOCAL MEDICATIONS USED:  MARCAINE     SPECIMEN:  No Specimen  DISPOSITION OF SPECIMEN:  N/A  COUNTS:  YES  TOURNIQUET:    DICTATIO: [id number]: [id number]  PLAN OF CARE: Admit to inpatient   PATIENT DISPOSITION:  PACU - hemodynamically stable.   Delay start of Pharmacological VTE agent (>24hrs) due to surgical blood loss or risk of bleeding: not applicable

## 2014-06-12 NOTE — ED Notes (Signed)
Pt transported to XRAY °

## 2014-06-12 NOTE — Transfer of Care (Signed)
Immediate Anesthesia Transfer of Care Note  Patient: Yvonne [name]  Procedure(s) Performed: Procedure(s): OPEN REDUCTION INTERNAL FIXATION (ORIF) WRIST FRACTURE (Left)  Patient Location: PACU  Anesthesia Type:General  Level of Consciousness: awake, alert  and oriented  Airway & Oxygen Therapy: Patient Spontanous Breathing and Patient connected to nasal cannula oxygen  Post-op Assessment: Report given to RN and Post -op Vital signs reviewed and stable  Post vital signs: Reviewed and stable  Last Vitals:  Filed Vitals:   06/12/14 2250  BP: 103/51  Pulse: 73  Temp: 36.7 C  Resp: 15    Complications: No apparent anesthesia complications

## 2014-06-12 NOTE — ED Notes (Signed)
Patient transported to X-ray 

## 2014-06-12 NOTE — ED Notes (Signed)
[facility] has been called by this RN

## 2014-06-12 NOTE — ED Notes (Signed)
Attempted to call report to 6N.

## 2014-06-12 NOTE — ED Notes (Signed)
[facility] tech at bedside.

## 2014-06-12 NOTE — Consult Note (Signed)
Reason for Consult:LEFT DISTAL RADIUS FRACTURE Referring Physician: DR. Alcide Goodness is an 81 y.o. female.  HPI: Patient is a 81 year old female who while walking her dog today stumbled on a rock and fell onto her left side. She has had pain in her left wrist and some pain in her lower left chest with deep inspiration. She is a little confused about the exact events and per her friend has some baseline memory problems. There was no syncope or loss of consciousness.  Past Medical History  Diagnosis Date  . Hypertension   . Hypercholesteremia     Past Surgical History  Procedure Laterality Date  . Back surgery      Family History  Problem Relation Age of Onset  . Hypertension Mother   . Hypertension Father     Social History:  reports that she quit smoking about 31 years ago. She has never used smokeless tobacco. She reports that she does not drink alcohol or use illicit drugs.  Allergies: No Known Allergies  Medications: I have reviewed the patient's current medications.  Results for orders placed or performed during the hospital encounter of 06/12/14 (from the past 48 hour(s))  CBC with Differential     Status: Abnormal   Collection Time: 06/12/14  3:42 PM  Result Value Ref Range   WBC 7.8 4.0 - 10.5 K/uL   RBC 4.03 3.87 - 5.11 MIL/uL   Hemoglobin 12.6 12.0 - 15.0 g/dL   HCT 37.5 36.0 - 46.0 %   MCV 93.1 78.0 - 100.0 fL   MCH 31.3 26.0 - 34.0 pg   MCHC 33.6 30.0 - 36.0 g/dL   RDW 12.9 11.5 - 15.5 %   Platelets 130 (L) 150 - 400 K/uL   Neutrophils Relative % 79 (H) 43 - 77 %   Neutro Abs 6.1 1.7 - 7.7 K/uL   Lymphocytes Relative 12 12 - 46 %   Lymphs Abs 0.9 0.7 - 4.0 K/uL   Monocytes Relative 8 3 - 12 %   Monocytes Absolute 0.6 0.1 - 1.0 K/uL   Eosinophils Relative 1 0 - 5 %   Eosinophils Absolute 0.1 0.0 - 0.7 K/uL   Basophils Relative 0 0 - 1 %   Basophils Absolute 0.0 0.0 - 0.1 K/uL  Basic metabolic panel     Status: Abnormal   Collection Time:  06/12/14  3:42 PM  Result Value Ref Range   Sodium 138 135 - 145 mmol/L   Potassium 5.1 3.5 - 5.1 mmol/L   Chloride 101 96 - 112 mmol/L   CO2 26 19 - 32 mmol/L   Glucose, Bld 115 (H) 70 - 99 mg/dL   BUN 29 (H) 6 - 23 mg/dL   Creatinine, Ser 1.33 (H) 0.50 - 1.10 mg/dL   Calcium 9.4 8.4 - 10.5 mg/dL   GFR calc non Af Amer 33 (L) >90 mL/min   GFR calc Af Amer 39 (L) >90 mL/min    Comment: (NOTE) The eGFR has been calculated using the CKD EPI equation. This calculation has not been validated in all clinical situations. eGFR's persistently <90 mL/min signify possible Chronic Kidney Disease.    Anion gap 11 5 - 15  Protime-INR     Status: None   Collection Time: 06/12/14  3:42 PM  Result Value Ref Range   Prothrombin Time 14.4 11.6 - 15.2 seconds   INR 1.11 0.00 - 1.49  Type and screen     Status: None   Collection Time:  06/12/14  3:52 PM  Result Value Ref Range   ABO/RH(D) A POS    Antibody Screen NEG    Sample Expiration 06/15/2014   I-stat chem 8, ed     Status: Abnormal   Collection Time: 06/12/14  4:22 PM  Result Value Ref Range   Sodium 137 135 - 145 mmol/L   Potassium 4.2 3.5 - 5.1 mmol/L   Chloride 99 96 - 112 mmol/L   BUN 28 (H) 6 - 23 mg/dL   Creatinine, Ser 1.30 (H) 0.50 - 1.10 mg/dL   Glucose, Bld 117 (H) 70 - 99 mg/dL   Calcium, Ion 1.12 (L) 1.13 - 1.30 mmol/L   TCO2 23 0 - 100 mmol/L   Hemoglobin 12.9 12.0 - 15.0 g/dL   HCT 38.0 36.0 - 46.0 %    Dg Ribs Unilateral W/chest Left  06/12/2014   CLINICAL DATA:  Patient fell on left side while walking dog. Left-sided pain  EXAM: LEFT RIBS AND CHEST - 3+ VIEW  COMPARISON:  Chest radiograph April 05, 2013  FINDINGS: Frontal chest as well as oblique and cone-down lateral rib images obtained. There is underlying emphysematous change. There is no edema or consolidation. Heart size and pulmonary vascularity are normal. There is scarring at the level of the anterior left first rib.  Aorta is tortuous with atherosclerotic  change throughout the thoracic aorta.  There are surgical clips the right neck region.  There is an acute nondisplaced fracture of the anterior left eighth rib. No other acute fracture seen. There old healed rib fractures on the right. Patient is status post previous kyphoplasty procedure at L4.  IMPRESSION: Acute nondisplaced fracture anterior left eighth rib. No pneumothorax or effusion. Underlying emphysema. Evidence of old rib trauma on the right with healing.   Electronically Signed   By: Lowella Grip III M.D.   On: 06/12/2014 14:25   Dg Forearm Left  06/12/2014   CLINICAL DATA:  81 year old female fell while walking dog with pain and swelling. Initial encounter.  EXAM: LEFT FOREARM - 2 VIEW  COMPARISON:  Left wrist series from the same day reported separately.  FINDINGS: Distal left radius and ulna fractures as described on the comparison from today.  The more proximal left radius and ulna appear intact. Grossly normal alignment at the left elbow.  IMPRESSION: 1. Distal left radius and ulna fractures as described on the left wrist series. 2. No more proximal left forearm fracture identified.   Electronically Signed   By: Genevie Ann M.D.   On: 06/12/2014 12:58   Dg Wrist Complete Left  06/12/2014   CLINICAL DATA:  81 year old female fell while walking dog outside. Swelling and pain. Initial encounter.  EXAM: LEFT WRIST - COMPLETE 3+ VIEW  COMPARISON:  None.  FINDINGS: Generalized soft tissue swelling. Comminuted and impacted distal right radius fracture with DRU and radiocarpal joint involvement. Volar impaction and angulation. Over riding of fracture fragments up to 12 mm.  Mildly displaced ulnar styloid fracture.  Carpal bone alignment appears preserved. Metacarpals appear intact. Degenerative changes at the basal joint of the left thumb.  IMPRESSION: 1. Severely comminuted and dorsally impacted distal left radius fracture with DRU and radiocarpal joint involvement. 2. Ulnar styloid fracture.    Electronically Signed   By: Genevie Ann M.D.   On: 06/12/2014 12:57    ROS NO RECENT ILLNESSES OR HOSPITALIZATIONS Blood pressure 165/67, pulse 64, temperature 99 F (37.2 C), temperature source Oral, resp. rate 18, SpO2 94 %. Physical Exam  General Appearance:  Alert, cooperative, no distress, appears stated age  Head:  Normocephalic, without obvious abnormality, atraumatic  Eyes:  Pupils equal, conjunctiva/corneas clear,         Throat: Lips, mucosa, and tongue normal; teeth and gums normal  Neck: No visible masses     Lungs:   respirations unlabored  Chest Wall:  No tenderness or deformity  Heart:  Regular rate and rhythm,  Abdomen:   Soft, non-tender,         Extremities: LUE: IN SUGARTONG SPLINT.FINGERS WARM WELL PERFUSED ABLE TO EXTEND THUMB AND WIGGLES FINGERS  Pulses: 2+ and symmetric  Skin: Skin color, texture, turgor normal, no rashes or lesions     Neurologic: Normal    Assessment/Plan: LEFT WRIST INTRA ARTICULAR DISTAL RADIUS FRACTURE, DISPLACED VOLAR BARTONS FRACTURE  LEFT WRIST OPEN REDUCTION AND INTERNAL FIXATION AND REPAIR AS INDICATED  R/B/A DISCUSSED WITH PT IN HOSPITAL.  PT VOICED UNDERSTANDING OF PLAN CONSENT SIGNED DAY OF SURGERY PT SEEN AND EXAMINED PRIOR TO OPERATIVE PROCEDURE/DAY OF SURGERY SITE MARKED. QUESTIONS ANSWERED WILL REMAIN AN INPATIENT FOLLOWING SURGERY  WE ARE PLANNING SURGERY FOR YOUR UPPER EXTREMITY. THE RISKS AND BENEFITS OF SURGERY INCLUDE BUT NOT LIMITED TO BLEEDING INFECTION, DAMAGE TO NEARBY NERVES ARTERIES TENDONS, FAILURE OF SURGERY TO ACCOMPLISH ITS INTENDED GOALS, PERSISTENT SYMPTOMS AND NEED FOR FURTHER SURGICAL INTERVENTION. WITH THIS IN MIND WE WILL PROCEED. I HAVE DISCUSSED WITH THE PATIENT THE PRE AND POSTOPERATIVE REGIMEN AND THE DOS AND DON'TS. PT VOICED UNDERSTANDING AND INFORMED CONSENT SIGNED.  Linna Hoff 06/12/2014, 8:17 PM

## 2014-06-12 NOTE — ED Notes (Signed)
[facility] at bedside 

## 2014-06-12 NOTE — Progress Notes (Signed)
Nwb LUE ICE/ELEVATE OT FOR DIGITAL ROM AND EDEMA CONTROL F/U WITH ME IN 2 WEEKS DO NOT REMOVE SPLINT CONTACT ME IF ANY QUESTIONS [phone]

## 2014-06-12 NOTE — Anesthesia Procedure Notes (Addendum)
Anesthesia Regional Block:  Supraclavicular block  Pre-Anesthetic Checklist: ,, timeout performed, Correct Patient, Correct Site, Correct Laterality, Correct Procedure, Correct Position, site marked, Risks and benefits discussed, Surgical consent,  Pre-op evaluation,  Post-op pain management  Laterality: Left  Prep: chloraprep       Needles:  Injection technique: Single-shot  Needle Type: Echogenic Stimulator Needle     Needle Length: 9cm 9 cm Needle Gauge: 22 and 22 G    Additional Needles:  Procedures: ultrasound guided (picture in chart) Supraclavicular block Narrative:  Start time: 06/12/2014 9:20 PM End time: 06/12/2014 9:25 PM Injection made incrementally with aspirations every 5 mL.  Performed by: Personally   Additional Notes: 30 cc 2% lidocaine with 1:200 epi injected easily   Procedure Name: LMA Insertion Date/Time: 06/12/2014 10:18 PM Performed by: Daiva EvesAVENEL, Arnita Koons W Pre-anesthesia Checklist: Patient identified, Emergency Drugs available, Suction available, Patient being monitored and Timeout performed Patient Re-evaluated:Patient Re-evaluated prior to inductionOxygen Delivery Method: Circle system utilized Preoxygenation: Pre-oxygenation with 100% oxygen Intubation Type: IV induction LMA: LMA inserted LMA Size: 4.0 Number of attempts: 1 Placement Confirmation: positive ETCO2,  CO2 detector and breath sounds checked- equal and bilateral Tube secured with: Tape Dental Injury: Teeth and Oropharynx as per pre-operative assessment

## 2014-06-12 NOTE — ED Notes (Signed)
MD Yao at bedside 

## 2014-06-12 NOTE — Anesthesia Preprocedure Evaluation (Addendum)
Anesthesia Evaluation  Patient identified by MRN, date of birth, ID band Patient awake    Reviewed: reviewed documented beta blocker date and time   Airway Mallampati: II  TM Distance: >3 FB Neck ROM: Full    Dental  (+) Teeth Intact, Dental Advisory Given   Pulmonary former smoker,  breath sounds clear to auscultation        Cardiovascular hypertension, Rhythm:Regular Rate:Normal  EKG 04/06/13- NSR with PAC   Neuro/Psych    GI/Hepatic   Endo/Other    Renal/GU      Musculoskeletal   Abdominal   Peds  Hematology   Anesthesia Other Findings   Reproductive/Obstetrics                         Anesthesia Physical Anesthesia Plan  ASA: III  Anesthesia Plan: MAC and Regional   Post-op Pain Management:    Induction: Intravenous  Airway Management Planned: Oral ETT  Additional Equipment:   Intra-op Plan:   Post-operative Plan: Extubation in OR  Informed Consent: I have reviewed the patients History and Physical, chart, labs and discussed the procedure including the risks, benefits and alternatives for the proposed anesthesia with the patient or authorized representative who has indicated his/her understanding and acceptance.   Dental advisory given  Plan Discussed with: CRNA, Anesthesiologist and Surgeon  Anesthesia Plan Comments:        Anesthesia Quick Evaluation

## 2014-06-12 NOTE — Progress Notes (Signed)
Spoke with Dr Silverio LayYao about pt.  States pt has wrist fx. [facility] at bedside not willing to take pt home for outpatient surgery.  Pt lived alone and Surgeon can do surgery 06/12/14 [facility]. Encouraged EDP to discuss with hospitalist.

## 2014-06-12 NOTE — ED Notes (Signed)
MD Hoxworth at bedside.

## 2014-06-12 NOTE — ED Notes (Addendum)
Attempted to call report to [address]. Was told by secretary they have just received her information. Will call back in 10 minutes.

## 2014-06-12 NOTE — Progress Notes (Signed)
Patient arrived via stretcher. Vital signs stable. Patient resting comfortably. MD on call paged per order for patient's arrival.

## 2014-06-12 NOTE — H&P (Signed)
[place] Y[place] [name] is an 81 y.o. female.    Chief Complaint: Fall, left wrist and chest pain  HPI: Patient is a 81 year old female who while walking her dog today stumbled on a rock and fell onto her left side. She has had pain in her left wrist and some pain in her lower left chest with deep inspiration. She is a little confused about the exact events and per her friend has some baseline memory problems. There was no syncope or loss of consciousness.  Past Medical History  Diagnosis Date  . Hypertension   . Hypercholesteremia     Past Surgical History  Procedure Laterality Date  . Back surgery      Family History  Problem Relation Age of Onset  . Hypertension Mother   . Hypertension Father    Social History:  reports that she quit smoking about 31 years ago. She has never used smokeless tobacco. She reports that she does not drink alcohol or use illicit drugs.  Allergies: No Known Allergies   No current facility-administered medications for this encounter.   Current Outpatient Prescriptions  Medication Sig Dispense Refill  . clopidogrel (PLAVIX) 75 MG tablet Take 1 tablet (75 mg total) by mouth daily with breakfast. 30 tablet 1  . diltiazem (CARDIZEM CD) 240 MG 24 hr capsule Take 1 capsule (240 mg total) by mouth daily. Start 04/10/13 30 capsule 1  . metoprolol (LOPRESSOR) 50 MG tablet Take 50 mg by mouth 2 (two) times daily.    . Multiple Vitamin (MULTIVITAMIN WITH MINERALS) TABS tablet Take 1 tablet by mouth daily.    . sertraline (ZOLOFT) 50 MG tablet Take 50 mg by mouth every morning.    . simvastatin (ZOCOR) 40 MG tablet Take 40 mg by mouth at bedtime.    Marland Kitchen. albuterol (PROVENTIL HFA;VENTOLIN HFA) 108 (90 BASE) MCG/ACT inhaler Inhale 2 puffs into the lungs every 6 (six) hours as needed for wheezing or shortness of breath. (Patient not taking: Reported on 06/12/2014) 1 Inhaler 1  . mometasone-formoterol (DULERA) 200-5 MCG/ACT AERO Inhale 2 puffs into the lungs 2 (two) times  daily. (Patient not taking: Reported on 06/12/2014) 1 Inhaler 1     Results for orders placed or performed during the hospital encounter of 06/12/14 (from the past 48 hour(s))  I-stat chem 8, ed     Status: Abnormal   Collection Time: 06/12/14  4:22 PM  Result Value Ref Range   Sodium 137 135 - 145 mmol/L   Potassium 4.2 3.5 - 5.1 mmol/L   Chloride 99 96 - 112 mmol/L   BUN 28 (H) 6 - 23 mg/dL   Creatinine, Ser 1.611.30 (H) 0.50 - 1.10 mg/dL   Glucose, Bld 096117 (H) 70 - 99 mg/dL   Calcium, Ion 0.451.12 (L) 1.13 - 1.30 mmol/L   TCO2 23 0 - 100 mmol/L   Hemoglobin 12.9 12.0 - 15.0 g/dL   HCT 40.938.0 81.136.0 - 91.446.0 %   Dg Ribs Unilateral W/chest Left  06/12/2014   CLINICAL DATA:  Patient fell on left side while walking dog. Left-sided pain  EXAM: LEFT RIBS AND CHEST - 3+ VIEW  COMPARISON:  Chest radiograph April 05, 2013  FINDINGS: Frontal chest as well as oblique and cone-down lateral rib images obtained. There is underlying emphysematous change. There is no edema or consolidation. Heart size and pulmonary vascularity are normal. There is scarring at the level of the anterior left first rib.  Aorta is tortuous with atherosclerotic change throughout the thoracic aorta.  There are surgical clips the right neck region.  There is an acute nondisplaced fracture of the anterior left eighth rib. No other acute fracture seen. There old healed rib fractures on the right. Patient is status post previous kyphoplasty procedure at L4.  IMPRESSION: Acute nondisplaced fracture anterior left eighth rib. No pneumothorax or effusion. Underlying emphysema. Evidence of old rib trauma on the right with healing.   Electronically Signed   By: Bretta Bang III M.D.   On: 06/12/2014 14:25   Dg Forearm Left  06/12/2014   CLINICAL DATA:  81 year old female fell while walking dog with pain and swelling. Initial encounter.  EXAM: LEFT FOREARM - 2 VIEW  COMPARISON:  Left wrist series from the same day reported separately.  FINDINGS:  Distal left radius and ulna fractures as described on the comparison from today.  The more proximal left radius and ulna appear intact. Grossly normal alignment at the left elbow.  IMPRESSION: 1. Distal left radius and ulna fractures as described on the left wrist series. 2. No more proximal left forearm fracture identified.   Electronically Signed   By: Odessa Fleming M.D.   On: 06/12/2014 12:58   Dg Wrist Complete Left  06/12/2014   CLINICAL DATA:  81 year old female fell while walking dog outside. Swelling and pain. Initial encounter.  EXAM: LEFT WRIST - COMPLETE 3+ VIEW  COMPARISON:  None.  FINDINGS: Generalized soft tissue swelling. Comminuted and impacted distal right radius fracture with DRU and radiocarpal joint involvement. Volar impaction and angulation. Over riding of fracture fragments up to 12 mm.  Mildly displaced ulnar styloid fracture.  Carpal bone alignment appears preserved. Metacarpals appear intact. Degenerative changes at the basal joint of the left thumb.  IMPRESSION: 1. Severely comminuted and dorsally impacted distal left radius fracture with DRU and radiocarpal joint involvement. 2. Ulnar styloid fracture.   Electronically Signed   By: Odessa Fleming M.D.   On: 06/12/2014 12:57    Review of Systems  Constitutional: Negative.   Respiratory: Negative.   Cardiovascular: Negative.   Gastrointestinal: Negative.   Musculoskeletal: Positive for joint pain.  Neurological: Negative.   Psychiatric/Behavioral: Positive for memory loss.    Blood pressure 179/71, pulse 62, temperature 97.8 F (36.6 C), temperature source Oral, resp. rate 18, SpO2 95 %. Physical Exam  General: Alert, well-developed Elderly Caucasian female who appears younger than her stated age, in no distress Skin: Warm and dry without rash or infection. HEENT: No palpable masses or thyromegaly. Sclera nonicteric. Pupils equal round and reactive. Oropharynx clear. Neck nontender and full active range of motion without  pain Lungs: Breath sounds clear and equal without increased work of breathing Chest: Minimal left anterior lateral chest tenderness. No crepitance. Cardiovascular: Regular rate and rhythm without murmur. No JVD or edema.  Abdomen: Nondistended. Soft and nontender. No masses palpable. No organomegaly. No palpable hernias. Extremities: No edema.  Left wrist and forearm splinted Neurologic: Alert and Oriented to person place and situation although some confusion as to details. No gross motor deficits.  Assessment/Plan Trip and fall from ground level with #1 fracture left distal radius and ulna and #2 fracture anterior eighth rib. Patient is to be transferred to trauma service and orthopedic evaluation with likely ORIF left wrist fracture  Kayne Yuhas T 06/12/2014, 4:34 PM

## 2014-06-12 NOTE — ED Notes (Signed)
Pt fell today (tripped) and fell on left side. Left wrist is visibly swollen and slight deformity noted. Denies left elbow or left shoulder pain. Does have slight left rib pain with activity. Denies dizziness before the fall. No other c/c.

## 2014-06-12 NOTE — ED Provider Notes (Signed)
CSN: [id number]     Arrival date & time 06/12/14  1135 History   First MD Initiated Contact with Patient 06/12/14 1148     Chief Complaint  Patient presents with  . Arm Injury  . Shoulder Injury     (Consider location/radiation/quality/duration/timing/severity/associated sxs/prior Treatment) The history is provided by the patient.  Yvonne [name] is a 81 y.o. female hx of HTN, HL here presenting with left wrist pain. She was walking the dog and tripped over something and fell with outstretched hand on the left. She also hit the left side of her chest as well. Denies any loss of consciousness or syncope or head injury.    Past Medical History  Diagnosis Date  . Hypertension   . Hypercholesteremia    Past Surgical History  Procedure Laterality Date  . Back surgery     Family History  Problem Relation Age of Onset  . Hypertension Mother   . Hypertension Father    History  Substance Use Topics  . Smoking status: Former Smoker    Quit date: 04/05/1983  . Smokeless tobacco: Never Used  . Alcohol Use: No   OB History    No data available     Review of Systems  Musculoskeletal:       Rib pain, L wrist pain   All other systems reviewed and are negative.     Allergies  Review of patient's allergies indicates no known allergies.  Home Medications   Prior to Admission medications   Medication Sig Start Date End Date Taking? Authorizing Provider  clopidogrel (PLAVIX) 75 MG tablet Take 1 tablet (75 mg total) by mouth daily with breakfast. 04/09/13  Yes Catarina Hartshorn, MD  diltiazem (CARDIZEM CD) 240 MG 24 hr capsule Take 1 capsule (240 mg total) by mouth daily. Start 04/10/13 04/10/13  Yes Catarina Hartshorn, MD  metoprolol (LOPRESSOR) 50 MG tablet Take 50 mg by mouth 2 (two) times daily.   Yes Historical Provider, MD  Multiple Vitamin (MULTIVITAMIN WITH MINERALS) TABS tablet Take 1 tablet by mouth daily.   Yes Historical Provider, MD  sertraline (ZOLOFT) 50 MG tablet Take 50 mg by  mouth every morning.   Yes Historical Provider, MD  simvastatin (ZOCOR) 40 MG tablet Take 40 mg by mouth at bedtime.   Yes Historical Provider, MD  albuterol (PROVENTIL HFA;VENTOLIN HFA) 108 (90 BASE) MCG/ACT inhaler Inhale 2 puffs into the lungs every 6 (six) hours as needed for wheezing or shortness of breath. Patient not taking: Reported on 06/12/2014 04/09/13   Catarina Hartshorn, MD  mometasone-formoterol [facility]) 200-5 MCG/ACT AERO Inhale 2 puffs into the lungs 2 (two) times daily. Patient not taking: Reported on 06/12/2014 04/09/13   Catarina Hartshorn, MD   BP 164/65 mmHg  Pulse 66  Temp(Src) 97.8 F (36.6 C) (Oral)  Resp 18  SpO2 95% Physical Exam  Constitutional: She is oriented to person, place, and time.  Chronically ill, uncomfortable   HENT:  Head: Normocephalic.  Mouth/Throat: Oropharynx is clear and moist.  Eyes: Conjunctivae are normal. Pupils are equal, round, and reactive to light.  Neck: Normal range of motion. Neck supple.  Cardiovascular: Normal rate, regular rhythm and normal heart sounds.   Pulmonary/Chest: Effort normal and breath sounds normal. No respiratory distress. She has no wheezes. She has no rales.  L lower rib tenderness, no obvious bruise or deformity   Abdominal: Soft. Bowel sounds are normal. She exhibits no distension. There is no tenderness. There is no rebound.  Musculoskeletal:  L wrist obvious deformity. 2+ pulses. Nl capillary refill   Neurological: She is alert and oriented to person, place, and time.  Skin: Skin is warm and dry.  Psychiatric: She has a normal mood and affect. Her behavior is normal. Judgment and thought content normal.  Nursing note and vitals reviewed.   ED Course  Procedures (including critical care time) Labs Review Labs Reviewed  CBC WITH DIFFERENTIAL/PLATELET  BASIC METABOLIC PANEL  PROTIME-INR  I-STAT CHEM 8, ED  TYPE AND SCREEN    Imaging Review Dg Ribs Unilateral W/chest Left  06/12/2014   CLINICAL DATA:  Patient fell on  left side while walking dog. Left-sided pain  EXAM: LEFT RIBS AND CHEST - 3+ VIEW  COMPARISON:  Chest radiograph April 05, 2013  FINDINGS: Frontal chest as well as oblique and cone-down lateral rib images obtained. There is underlying emphysematous change. There is no edema or consolidation. Heart size and pulmonary vascularity are normal. There is scarring at the level of the anterior left first rib.  Aorta is tortuous with atherosclerotic change throughout the thoracic aorta.  There are surgical clips the right neck region.  There is an acute nondisplaced fracture of the anterior left eighth rib. No other acute fracture seen. There old healed rib fractures on the right. Patient is status post previous kyphoplasty procedure at L4.  IMPRESSION: Acute nondisplaced fracture anterior left eighth rib. No pneumothorax or effusion. Underlying emphysema. Evidence of old rib trauma on the right with healing.   Electronically Signed   By: Bretta Bang III M.D.   On: 06/12/2014 14:25   Dg Forearm Left  06/12/2014   CLINICAL DATA:  81 year old female fell while walking dog with pain and swelling. Initial encounter.  EXAM: LEFT FOREARM - 2 VIEW  COMPARISON:  Left wrist series from the same day reported separately.  FINDINGS: Distal left radius and ulna fractures as described on the comparison from today.  The more proximal left radius and ulna appear intact. Grossly normal alignment at the left elbow.  IMPRESSION: 1. Distal left radius and ulna fractures as described on the left wrist series. 2. No more proximal left forearm fracture identified.   Electronically Signed   By: Odessa Fleming M.D.   On: 06/12/2014 12:58   Dg Wrist Complete Left  06/12/2014   CLINICAL DATA:  81 year old female fell while walking dog outside. Swelling and pain. Initial encounter.  EXAM: LEFT WRIST - COMPLETE 3+ VIEW  COMPARISON:  None.  FINDINGS: Generalized soft tissue swelling. Comminuted and impacted distal right radius fracture with DRU and  radiocarpal joint involvement. Volar impaction and angulation. Over riding of fracture fragments up to 12 mm.  Mildly displaced ulnar styloid fracture.  Carpal bone alignment appears preserved. Metacarpals appear intact. Degenerative changes at the basal joint of the left thumb.  IMPRESSION: 1. Severely comminuted and dorsally impacted distal left radius fracture with DRU and radiocarpal joint involvement. 2. Ulnar styloid fracture.   Electronically Signed   By: Odessa Fleming M.D.   On: 06/12/2014 12:57     EKG Interpretation None      MDM   Final diagnoses:  Fall    Yvonne [name] is a 81 y.o. female here with fall. Has obvious wrist fracture. Will get rib xrays as well.   2 pm Xray showed distal radius fracture. Consulted Dr. Orlan Leavens from hand surgery. Recommend f/u with him tomorrow at 9 am and sugar tongue splint.   3pm Patient doesn't want to go home as  she lives by herself and she has no way to take care of herself at home. Does have a rib fracture as well. Called Dr. Melvyn Novasrtmann, who recommend trauma admission[facility] and he will do surgery on wrist today.   3:59 PM Talked with Dr. Lindie SpruceWyatt[facility] W[facility] do [facility] orders.   4: 10 PM [facility] do [facility] and orders.   Richardean Canalavid H Andrell Bergeson, MD 06/12/14 [phone]

## 2014-06-13 ENCOUNTER — Encounter (HOSPITAL_COMMUNITY): Payer: Self-pay | Admitting: General Practice

## 2014-06-13 ENCOUNTER — Inpatient Hospital Stay (HOSPITAL_COMMUNITY): Payer: Medicare Other

## 2014-06-13 LAB — BASIC METABOLIC PANEL
Anion gap: 13 (ref 5–15)
BUN: 22 mg/dL (ref 6–23)
CHLORIDE: 101 mmol/L (ref 96–112)
CO2: 22 mmol/L (ref 19–32)
CREATININE: 1.32 mg/dL — AB (ref 0.50–1.10)
Calcium: 8.6 mg/dL (ref 8.4–10.5)
GFR calc Af Amer: 39 mL/min — ABNORMAL LOW (ref >90)
GFR, EST NON AFRICAN AMERICAN: 34 mL/min — AB (ref >90)
GLUCOSE: 144 mg/dL — AB (ref 70–99)
Potassium: 4.4 mmol/L (ref 3.5–5.1)
Sodium: 136 mmol/L (ref 135–145)

## 2014-06-13 LAB — CBC
HEMATOCRIT: 34.3 % — AB (ref 36.0–46.0)
Hemoglobin: 11.2 g/dL — ABNORMAL LOW (ref 12.0–15.0)
MCH: 30.5 pg (ref 26.0–34.0)
MCHC: 32.7 g/dL (ref 30.0–36.0)
MCV: 93.5 fL (ref 78.0–100.0)
Platelets: 129 10*3/uL — ABNORMAL LOW (ref 150–400)
RBC: 3.67 MIL/uL — AB (ref 3.87–5.11)
RDW: 13.2 % (ref 11.5–15.5)
WBC: 7.4 10*3/uL (ref 4.0–10.5)

## 2014-06-13 LAB — ABO/RH: ABO/RH(D): A POS

## 2014-06-13 MED ORDER — METOPROLOL TARTRATE 50 MG PO TABS
50.0000 mg | ORAL_TABLET | Freq: Two times a day (BID) | ORAL | Status: DC
  Administered 2014-06-13 – 2014-06-14 (×3): 50 mg via ORAL
  Filled 2014-06-13 (×3): qty 1

## 2014-06-13 MED ORDER — DILTIAZEM HCL ER COATED BEADS 240 MG PO CP24
240.0000 mg | ORAL_CAPSULE | Freq: Every day | ORAL | Status: DC
  Administered 2014-06-13 – 2014-06-14 (×2): 240 mg via ORAL
  Filled 2014-06-13 (×2): qty 1

## 2014-06-13 MED ORDER — SIMVASTATIN 40 MG PO TABS
40.0000 mg | ORAL_TABLET | Freq: Every day | ORAL | Status: DC
  Administered 2014-06-13: 40 mg via ORAL
  Filled 2014-06-13: qty 1

## 2014-06-13 MED ORDER — SERTRALINE HCL 50 MG PO TABS
50.0000 mg | ORAL_TABLET | Freq: Every morning | ORAL | Status: DC
  Administered 2014-06-13 – 2014-06-14 (×2): 50 mg via ORAL
  Filled 2014-06-13 (×2): qty 1

## 2014-06-13 NOTE — Progress Notes (Signed)
[facility] Trauma Service  Progress Note   LOS: 1 day   Subjective: Slightly demented 81 y/o.  C/o pain in her left arm and keeps asking me why it hurts.  Ask many appropriate questions.  Says she doesn't hurt anywhere else.  No N/V or abdominal pain.  No neck/back pain.  She's hungry and wants to eat.  Thirsty and says her mouth is dry.  Objective: Vital signs in last 24 hours: Temp:  [97.8 F (36.6 C)-99 F (37.2 C)] 98.4 F (36.9 C) (03/17 0520) Pulse Rate:  [62-98] 71 (03/17 0520) Resp:  [15-18] 18 (03/17 0520) BP: (103-179)/(41-90) 169/65 mmHg (03/17 0520) SpO2:  [89 %-99 %] 97 % (03/17 0520) Weight:  [177 lb 7.5 oz (80.5 kg)] 177 lb 7.5 oz (80.5 kg) (03/16 2350)    Lab Results:  CBC  Recent Labs  06/12/14 1542 06/12/14 1622  WBC 7.8  --   HGB 12.6 12.9  HCT 37.5 38.0  PLT 130*  --    BMET  Recent Labs  06/12/14 1542 06/12/14 1622  NA 138 137  K 5.1 4.2  CL 101 99  CO2 26  --   GLUCOSE 115* 117*  BUN 29* 28*  CREATININE 1.33* 1.30*  CALCIUM 9.4  --     Imaging: Dg Ribs Unilateral W/chest Left  06/12/2014   CLINICAL DATA:  Patient fell on left side while walking dog. Left-sided pain  EXAM: LEFT RIBS AND CHEST - 3+ VIEW  COMPARISON:  Chest radiograph April 05, 2013  FINDINGS: Frontal chest as well as oblique and cone-down lateral rib images obtained. There is underlying emphysematous change. There is no edema or consolidation. Heart size and pulmonary vascularity are normal. There is scarring at the level of the anterior left first rib.  Aorta is tortuous with atherosclerotic change throughout the thoracic aorta.  There are surgical clips the right neck region.  There is an acute nondisplaced fracture of the anterior left eighth rib. No other acute fracture seen. There old healed rib fractures on the right. Patient is status post previous kyphoplasty procedure at L4.  IMPRESSION: Acute nondisplaced fracture anterior left eighth rib. No  pneumothorax or effusion. Underlying emphysema. Evidence of old rib trauma on the right with healing.   Electronically Signed   By: Bretta Bang III M.D.   On: 06/12/2014 14:25   Dg Forearm Left  06/12/2014   CLINICAL DATA:  81 year old female fell while walking dog with pain and swelling. Initial encounter.  EXAM: LEFT FOREARM - 2 VIEW  COMPARISON:  Left wrist series from the same day reported separately.  FINDINGS: Distal left radius and ulna fractures as described on the comparison from today.  The more proximal left radius and ulna appear intact. Grossly normal alignment at the left elbow.  IMPRESSION: 1. Distal left radius and ulna fractures as described on the left wrist series. 2. No more proximal left forearm fracture identified.   Electronically Signed   By: Odessa Fleming M.D.   On: 06/12/2014 12:58   Dg Wrist Complete Left  06/12/2014   CLINICAL DATA:  81 year old female fell while walking dog outside. Swelling and pain. Initial encounter.  EXAM: LEFT WRIST - COMPLETE 3+ VIEW  COMPARISON:  None.  FINDINGS: Generalized soft tissue swelling. Comminuted and impacted distal right radius fracture with DRU and radiocarpal joint involvement. Volar impaction and angulation. Over riding of fracture fragments up to 12 mm.  Mildly displaced ulnar styloid fracture.  Carpal bone alignment appears  preserved. Metacarpals appear intact. Degenerative changes at the basal joint of the left thumb.  IMPRESSION: 1. Severely comminuted and dorsally impacted distal left radius fracture with DRU and radiocarpal joint involvement. 2. Ulnar styloid fracture.   Electronically Signed   By: Odessa FlemingH  Hall M.D.   On: 06/12/2014 12:57     PE: General: pleasant, WD/WN white female who is laying in bed in NAD HEENT: head is normocephalic, atraumatic.  Sclera are noninjected.  PERRL.  Ears and nose without any masses or lesions.  Mouth is pink and moist Heart: regular, rate, and rhythm.  Normal s1,s2. No obvious murmurs, gallops, or  rubs noted.  Palpable radial and pedal pulses bilaterally Lungs: CTAB, no wheezes, rhonchi, or rales noted.  Respiratory effort nonlabored Abd: soft, NT/ND, +BS, no masses, hernias, or organomegaly MS: Left arm in sling and ace wrap s/p ORIF forearm/wrist Skin: warm and dry with no masses, lesions, or rashes Psych: A&Ox3 with an appropriate affect.   Assessment/Plan: HD #2 Ground level fall (tripped) Left distal radius/ulna fx - s/p ORIF Dr. Orlan Leavensrtman Left 8th Rib fx - pulm toilet, nebs HLD/HTN - resumed home meds VTE - SCD's, SQ heparin, normally on Plavix, will see how long she needs to be held off this FEN - reg diet Dispo -- PT/OT, possible d/c today vs tomorrow, she may be interested in SNF since she has no family in the area   [place]ow CreekMegan Dort, [place]PA-C Pager: [phone] General Trauma PA Pager: [phone]   06/13/2014

## 2014-06-13 NOTE — Evaluation (Signed)
Physical Therapy Evaluation Patient Details Name: Yvonne [name] MRN: [id number] DOB: 1919/03/01 Today's Date: 06/13/2014   History of Present Illness  Pt admitted after falling and fracturing her L radius, now s/p ORIF.  Impaired memory at baseline per chart.  Pt reports having had "mild strokes" in the past requiring ST SNF.  Clinical Impression  Pt admitted with above diagnosis. Pt currently with functional limitations due to the deficits listed below (see PT Problem List). Pt will benefit from skilled PT to increase their independence and safety. As patient lives alone and has significant mobility deficits at this time PT is recommending SNF placement for ongoing rehab.  Hopefully pt will be able to return home after post-acute rehab.        Follow Up Recommendations SNF;Supervision for mobility/OOB    Equipment Recommendations  Cane    Recommendations for Other Services       Precautions / Restrictions Precautions Precautions: Fall Required Braces or Orthoses: Sling Restrictions Weight Bearing Restrictions: Yes LUE Weight Bearing: Non weight bearing      Mobility  Bed Mobility Overal bed mobility:  (Nt, pt up in chair and did not want to return to bed) Bed Mobility: Supine to Sit     Supine to sit: HOB elevated;Supervision     General bed mobility comments: use of rail, extra time  Transfers Overall transfer level: Needs assistance Equipment used: None Transfers: Sit to/from Stand Sit to Stand: Mod assist (Peformed twice) Stand pivot transfers: Min assist          Ambulation/Gait Ambulation/Gait assistance: Mod assist Ambulation Distance (Feet): 3 Feet Assistive device: 1 person hand held assist Gait Pattern/deviations: Shuffle;Decreased stride length;Leaning posteriorly;Wide base of support (Heavy posterior lean) Gait velocity: greatly decreased      Stairs            Wheelchair Mobility    Modified Rankin (Stroke Patients Only)        Balance Overall balance assessment: Needs assistance Sitting-balance support: Feet supported Sitting balance-Leahy Scale: Good Sitting balance - Comments: NT greater than good   Standing balance support: Single extremity supported Standing balance-Leahy Scale: Poor Standing balance comment: heavy posterior lean                             Pertinent Vitals/Pain Pain Assessment: Faces Faces Pain Scale: Hurts little more Pain Location: L wrist and left ribs under breast Pain Descriptors / Indicators: Grimacing;Guarding Pain Intervention(s): Premedicated before session;Ice applied    Home Living Family/patient expects to be discharged to:: Other (Comment) (Pt would like to go to [facility] as she has been there ) Living Arrangements: Alone                    Prior Function Level of Independence: Independent         Comments: pt does not drive, friends take her to appointments and the store, otherwise independent.     Hand Dominance   Dominant Hand: Right    Extremity/Trunk Assessment   Upper Extremity Assessment: Defer to OT evaluation       LUE Deficits / Details: splinted from MPs to elbow, generalized weakness in shoulder, edematous fingers   Lower Extremity Assessment: Overall WFL for tasks assessed      Cervical / Trunk Assessment: Normal  Communication   Communication: HOH  Cognition Arousal/Alertness: Awake/alert Behavior During Therapy: WFL for tasks assessed/performed Overall Cognitive Status: History of cognitive impairments -  at baseline (No certain what basleine was)       Memory: Decreased short-term memory              General Comments General comments (skin integrity, edema, etc.): No family present. Pt alert and cooperative. Very tired at end of session and wanted to nap in chair    Exercises General Exercises - Lower Extremity Ankle Circles/Pumps: AROM;Both;5 reps;Seated      Assessment/Plan    PT  Assessment Patient needs continued PT services  PT Diagnosis Difficulty walking   PT Problem List Decreased activity tolerance;Decreased balance;Decreased mobility;Decreased knowledge of use of DME;Pain  PT Treatment Interventions DME instruction;Gait training;Therapeutic activities;Therapeutic exercise;Balance training;Patient/family education   PT Goals (Current goals can be found in the Care Plan section) Acute Rehab PT Goals Patient Stated Goal: To be able to walk better PT Goal Formulation: With patient Time For Goal Achievement: 06/20/14 Potential to Achieve Goals: Good    Frequency Min 3X/week   Barriers to discharge Other (comment) (Lives alone)      Co-evaluation               End of Session Equipment Utilized During Treatment: Gait belt;Other (comment) (Sling for LUE) Activity Tolerance: Patient tolerated treatment well Patient left: in chair;with call bell/phone within reach;Other (comment) (Pt was up in chair without alarm when PT arrived. RN aware ) Nurse Communication: Mobility status;Other (comment) (No chair alarm on)         Time: [id number] PT Time Calculation (min) (ACUTE ONLY): 24 min   Charges:   PT Evaluation $Initial PT Evaluation Tier I: 1 Procedure PT Treatments $Gait Training: 8-22 mins   PT G Codes:        Yvonne [name] 06/13/2014, 10:25 AM Yvonne [name], PT  [phone] 06/13/2014

## 2014-06-13 NOTE — Clinical Social Work Note (Addendum)
Clinical Social Work Department BRIEF PSYCHOSOCIAL ASSESSMENT 06/13/2014  Patient:  Yvonne [name], Yvonne [name]     Account Number:  [id number]     [place] date:  06/12/2014  Clinical Social Worker:  Myles Lipps  Date/Time:  06/13/2014 03:10 PM  Referred by:  Physician  Date Referred:  06/13/2014 Referred for  SNF Placement   Other Referral:   Interview type:  Patient Other interview type:   Spoke with patient and patient family member ([name] Ransone) at bedside    PSYCHOSOCIAL DATA Living Status:  ALONE Admitted from facility:   Level of care:   Primary support name:  Alejandro Mulling [phone] Primary support relationship to patient:  FRIEND Degree of support available:   Strong - patient HCPOA    CURRENT CONCERNS Current Concerns  Post-Acute Placement   Other Concerns:    SOCIAL WORK ASSESSMENT / PLAN Clinical Social Worker met with patient and patient family at bedside to offer support and discuss patient needs at discharge.  Patient states that she was out walking her dog when she tripped over something in the road, falling on her left side.  Patient states that she lives at home alone and is realistic regarding the option of not returning at discharge.  Patient states that she has been to [facility] in the past and would like to return if bed available.  CSW completed FL2 and initiated referral to [facility] - awaiting potential bed offer.  CSW contacted [facility] to provide information regarding patient request.    Clinical Social Worker inquired about current substance use.  Patient states that she used to drink Scotch on the rocks when she was younger but it has been several years since she has had an alcoholic beverage.  SBIRT completed.  No resources needed.  CSW to provide patient with available bed offer.  CSW remains available for support and to facilitate patient discharge needs.   Assessment/plan status:  Psychosocial Support/Ongoing Assessment of Needs Other  assessment/ plan:   Information/referral to community resources:   Clinical Social Worker contacted patient HCPOA Alejandro Mulling [phone]) who states that she is available tomorrow to complete paperwork at facility and another family member is available to assist with transportation.    PATIENT'S/FAMILY'S RESPONSE TO PLAN OF CARE: Patient alert and oriented x3 with some intermittent confusion and memory deficit.  Patient with family at bedside who are very supportive and agreeable with patient plan for SNF at discharge.  Patient verbalized understanding of CSW role and appreciation for support and concern.

## 2014-06-13 NOTE — Progress Notes (Signed)
UR completed.  Mylik Pro, RN BSN M[facility] CCM Trauma/Neuro ICU Case Manager [phone]  

## 2014-06-13 NOTE — Evaluation (Signed)
Occupational Therapy Evaluation Patient Details Name: Yvonne DrossVirginia K Empson MRN: [id number] DOB: 10/22/1918 Today's Date: 06/13/2014    History of Present Illness Pt admitted after falling and fracturing her L radius, now s/p ORIF.  Impaired memory at baseline per chart.  Pt reports having had "mild strokes" in the past requiring ST SNF.   Clinical Impression   Pt was living alone with the support of friends for errands. She does not have 24 hour care at home.  Pt presents with L UE pain, impaired balance, decreased functional use of L UE and impaired memory interfering with safety and ability to perform self care and ADL transfers.  Pt will need ST rehab in SNF prior to return home.  SW notified.  Will follow acutely.   Follow Up Recommendations  SNF;Supervision/Assistance - 24 hour    Equipment Recommendations  None recommended by OT    Recommendations for Other Services       Precautions / Restrictions Precautions Precautions: Fall Required Braces or Orthoses: Sling Restrictions Weight Bearing Restrictions: Yes LUE Weight Bearing: Non weight bearing (through wrist)      Mobility Bed Mobility Overal bed mobility: Needs Assistance Bed Mobility: Supine to Sit     Supine to sit: HOB elevated;Supervision     General bed mobility comments: use of rail, extra time  Transfers Overall transfer level: Needs assistance Equipment used: 1 person hand held assist Transfers: Sit to/from [employer] CorporationStand;Stand Pivot Transfers Sit to Stand: Min assist Stand pivot transfers: Min assist            Balance                                            ADL Overall ADL's : Needs assistance/impaired Eating/Feeding: Set up;Sitting Eating/Feeding Details (indicate cue type and reason): assist to open containers Grooming: Wash/dry face;Minimal assistance;Sitting   Upper Body Bathing: Moderate assistance;Sitting   Lower Body Bathing: Sit to/from stand;Maximal assistance    Upper Body Dressing : Moderate assistance;Sitting   Lower Body Dressing: Maximal assistance;Sit to/from stand   Toilet Transfer: Minimal assistance;Stand-pivot;BSC   Toileting- Clothing Manipulation and Hygiene: Maximal assistance;Sit to/from stand         General ADL Comments: Instructed pt in edema management elevating L UE on pillows in chair, icing and moving fingers.  Performed AAROM of L shoulder x 5.     Vision     Perception     Praxis      Pertinent Vitals/Pain Pain Assessment: Faces Faces Pain Scale: Hurts little more Pain Location: L wrist Pain Descriptors / Indicators: Grimacing;Guarding Pain Intervention(s): Limited activity within patient's tolerance;Monitored during session;Repositioned;Ice applied     Hand Dominance Right   Extremity/Trunk Assessment Upper Extremity Assessment Upper Extremity Assessment: LUE deficits/detail LUE Deficits / Details: splinted from MPs to elbow, generalized weakness in shoulder, edematous fingers LUE: Unable to fully assess due to immobilization;Unable to fully assess due to pain LUE Coordination: decreased fine motor;decreased gross motor   Lower Extremity Assessment Lower Extremity Assessment: Defer to PT evaluation       Communication Communication Communication: HOH (hearing aides are not here)   Cognition Arousal/Alertness: Awake/alert Behavior During Therapy: WFL for tasks assessed/performed Overall Cognitive Status: History of cognitive impairments - at baseline       Memory: Decreased short-term memory             General Comments  Exercises       Shoulder Instructions      Home Living Family/patient expects to be discharged to:: Skilled nursing facility                                        Prior Functioning/Environment Level of Independence: Independent        Comments: pt does not drive, friends take her to appointments and the store, otherwise independent.     OT Diagnosis: Generalized weakness;Cognitive deficits;Acute pain   OT Problem List: Decreased strength;Decreased activity tolerance;Impaired balance (sitting and/or standing);Decreased coordination;Decreased cognition;Decreased safety awareness;Pain;Impaired UE functional use;Increased edema;Decreased knowledge of use of DME or AE   OT Treatment/Interventions: Self-care/ADL training;DME and/or AE instruction;Therapeutic activities;Cognitive remediation/compensation;Patient/family education;Therapeutic exercise    OT Goals(Current goals can be found in the care plan section) Acute Rehab OT Goals Patient Stated Goal: Pt agrees with need for ST SNF for rehab. OT Goal Formulation: With patient Time For Goal Achievement: 06/20/14 Potential to Achieve Goals: Good ADL Goals Pt Will Perform Grooming: with supervision;standing Pt Will Perform Upper Body Dressing: sitting;with supervision Pt Will Perform Lower Body Dressing: with min assist;sit to/from stand Pt Will Transfer to Toilet: with supervision;ambulating Pt Will Perform Toileting - Clothing Manipulation and hygiene: with supervision;sit to/from stand Pt/caregiver will Perform Home Exercise Program: Left upper extremity;With written HEP provided (AROM L fingers and shoulder) Additional ADL Goal #1: Pt will be knowledgeable in edema management techniques for L UE.  OT Frequency: Min 2X/week   Barriers to D/C: Decreased caregiver support          Co-evaluation              End of Session Equipment Utilized During Treatment: Gait belt Nurse Communication: Mobility status (pt needs SNF, no available caregiver)  Activity Tolerance: Patient limited by pain Patient left: in chair;with call bell/phone within reach   Time: 0826-0903 OT Time Calculation (min): 37 min Charges:  OT General Charges $OT Visit: 1 Procedure OT Evaluation $Initial OT Evaluation Tier I: 1 Procedure OT Treatments $Self Care/Home Management : 8-22  mins G-Codes:    Evern Bio 06/13/2014, 9:13 AM  [phone]

## 2014-06-13 NOTE — Clinical Social Work Placement (Addendum)
Clinical Social Work Department CLINICAL SOCIAL WORK PLACEMENT NOTE 06/13/2014  Patient:  [name],Yvonne K  Account Number:  [id number] Admit date:  06/12/2014  Clinical Social Worker:  Macario GoldsJESSE Lyle Niblett, LCSW  Date/time:  06/13/2014 03:30 PM  Clinical Social Work is seeking post-discharge placement for this patient at the following level of care:   SKILLED NURSING   (*CSW will update this form in Epic as items are completed)   06/13/2014  Patient/family provided with [facility] of Clinical Social Work's list of facilities offering this level of care within the geographic area requested by the patient (or if unable, by the patient's family).  06/13/2014  Patient/family informed of their freedom to choose among providers that offer the needed level of care, that participate in Medicare, Medicaid or managed care program needed by the patient, have an available bed and are willing to accept the patient.  06/13/2014  Patient/family informed of [facility]' ownership interest in [facility], as well as of the fact that they are under no obligation to receive care at this facility.  PASARR submitted to EDS on 06/13/2014 PASARR number received on 06/13/2014  FL2 transmitted to all facilities in geographic area requested by pt/family on  06/13/2014 FL2 transmitted to all facilities within larger geographic area on   Patient informed that his/her managed care company has contracts with or will negotiate with  certain facilities, including the following:     Patient/family informed of bed offers received:  06/14/2014 Patient chooses bed at [facility] Physician recommends and patient chooses bed at    [facility]  [facility]   [facility] Patient and family notified of transfer on 06/14/2014 Name of family member notified:  Patient at bedside and [name] [facility]) over the phone  The following  physician request were entered in Epic:   Additional Comments:

## 2014-06-13 NOTE — Op Note (Signed)
NAMEAMBIKA, ZETTLEMOYER             ACCOUNT NO.:  [id number]  MEDICAL RECORD NO.:  [id number]  LOCATION:  6N13C                        FACILITY:  [facility]  PHYSICIAN:  Madelynn Done, MD  DATE OF BIRTH:  [birth year]  DATE OF PROCEDURE:  06/12/2014 DATE OF DISCHARGE:                              OPERATIVE REPORT   PREOPERATIVE DIAGNOSIS:  Left wrist intra-articular distal radius fracture, 3 or more fragments.  POSTOPERATIVE DIAGNOSIS:  Left wrist intra-articular distal radius fracture, 3 or more fragments.  SURGEON:  Madelynn Done, MD, who has scrubbed and was present for the entire procedure.  ASSISTANT SURGEON:  None.  ANESTHESIA:  General via LMA with supraclavicular block performed by Guadalupe Maple, M.D.  SURGICAL PROCEDURES: 1. Open treatment of left wrist intraarticular distal radius fracture,     3 or more fragments following internal fixation. 2. Left upper brachioradialis tendon release, tendon lengthening. 3. Left wrist radiographs, 3 views, left wrist.  RADIOGRAPH INTERPRETATION:  AP, lateral, and oblique views of the wrist interpreted by me  intraoperatively using the mini C-arm, did show reduction of the volar tilt, radial height and inclination with adequate placement of the plates and screws.  SURGICAL INDICATIONS:  Ms. Dipierro is a 81 year old right-hand-dominant female who has sustained a volar displaced comminuted intra-articular distal radius fracture.  The patient was seen and evaluated and recommended to undergo the above procedure.  Risks, benefits, and alternatives were discussed in detail with the patient and signed informed consent was obtained.  Risks include, but not limited to, bleeding, infection, damage to nearby nerves, arteries, or tendons, loss of motion of the wrist and digits, incomplete relief of symptoms, and need for further surgical intervention.  DESCRIPTION OF PROCEDURE:  The patient was properly identified in the preoperative  holding area and marked with a permanent marker made on left wrist to indicate the correct operative site.  The patient was then brought back to the operating room, placed supine on the anesthesia room table.  General anesthesia was administered.  The patient had previously undergone supraclavicular block.  Left upper extremity was then prepped and draped in normal sterile fashion.  Time-out was called, correct side was identified, and procedure then begun.  Attention turned to the left wrist.  A longitudinal incision was made directly over the FCR sheath. Dissection was carried down through the skin and subcutaneous tissue. The FCR sheath was then opened proximally and distally.  Going through the floor of the FCR sheath, the FPL was carefully identified and swept out of the way.  L-shaped pronator quadratus flap was then elevated and the fracture site was then exposed.  This was an intra-articular fracture 3 or more fragments.  Brachioradialis then carefully released off the radial column in order to aid in exposure.  Careful lengthening and release of the brachioradialis were then done.  The wound was then thoroughly irrigated.  Fracture hematoma was then evacuated.  Open reduction was then performed.  The volar plate was then applied and held distally with a K-wire.  The oblong screw hole was then placed proximally in the plate and plate height was then adjusted with removal of a K-wire.  After adequate placement,  distal fixation was then carried out with distal locking pegs moving from ulnar to radial direction.  One multidirectional screw was used to avoid any type of concern about penetration in the distal radioulnar joint.  Following this, proximal fixation was then finalized with the locking screws in the shaft.  The wound was then thoroughly irrigated.  Final radiographs have been obtained.  The pronator quadratus was then closed with a 2-0 Vicryl suture.  Subcutaneous tissues  closed with 4-0 Vicryl and skin closed with 4-0 Vicryl Rapide.  Adaptic dressing and sterile compressive bandage then applied.  The patient was then placed in a well-padded sugar-tong splint, extubated, and taken to recovery room in good condition.  POSTPROCEDURE PLAN:  The patient admitted back to the Trauma Service. Discharge when her pain is controlled.  Will be seen back in the office in approximately 2 weeks for wound check, suture removal, x-rays out of the splint and/or a short-arm cast.  Then, begin a therapy regimen at the 4-week mark.  Nonweightbearing in the left wrist.  Radiographs at each visit.     Madelynn DoneFred W Dotsie Gillette IV, MD     FWO/MEDQ  D:  06/12/2014  T:  06/13/2014  Job:  [id number]

## 2014-06-13 NOTE — Anesthesia Postprocedure Evaluation (Signed)
  Anesthesia Post-op Note  Patient: Yvonne [name]  Procedure(s) Performed: Procedure(s): OPEN REDUCTION INTERNAL FIXATION (ORIF) WRIST FRACTURE (Left)  Patient Location: PACU  Anesthesia Type:General and GA combined with regional for post-op pain  Level of Consciousness: awake, alert  and oriented  Airway and Oxygen Therapy: Patient Spontanous Breathing and Patient connected to nasal cannula oxygen  Post-op Pain: none  Post-op Assessment: Post-op Vital signs reviewed, Patient's Cardiovascular Status Stable, Respiratory Function Stable, Patent Airway and Pain level controlled  Post-op Vital Signs: stable  Last Vitals:  Filed Vitals:   06/12/14 2350  BP: 141/49  Pulse: 71  Temp: 36.9 C  Resp: 17    Complications: No apparent anesthesia complications

## 2014-06-14 MED ORDER — ACETAMINOPHEN 325 MG PO TABS: 650.0000 mg | ORAL_TABLET | Freq: Four times a day (QID) | ORAL | Status: DC | PRN

## 2014-06-14 MED ORDER — DOCUSATE SODIUM 100 MG PO CAPS
100.0000 mg | ORAL_CAPSULE | Freq: Two times a day (BID) | ORAL | Status: DC
  Administered 2014-06-14: 100 mg via ORAL
  Filled 2014-06-14: qty 1

## 2014-06-14 MED ORDER — ACETAMINOPHEN 650 MG RE SUPP: 650.0000 mg | Freq: Four times a day (QID) | RECTAL | Status: DC | PRN

## 2014-06-14 MED ORDER — POLYETHYLENE GLYCOL 3350 17 G PO PACK: 17.0000 g | PACK | Freq: Every day | ORAL | Status: DC | PRN

## 2014-06-14 MED ORDER — DOCUSATE SODIUM 100 MG PO CAPS: 100.0000 mg | ORAL_CAPSULE | Freq: Two times a day (BID) | ORAL | Status: DC | PRN

## 2014-06-14 MED ORDER — TRAMADOL HCL 50 MG PO TABS: 50.0000 mg | ORAL_TABLET | Freq: Four times a day (QID) | ORAL | Status: DC | PRN

## 2014-06-14 MED ORDER — POLYETHYLENE GLYCOL 3350 17 G PO PACK
17.0000 g | PACK | Freq: Every day | ORAL | Status: DC
  Administered 2014-06-14: 17 g via ORAL
  Filled 2014-06-14: qty 1

## 2014-06-14 MED ORDER — BISACODYL 10 MG RE SUPP: 10.0000 mg | Freq: Every day | RECTAL | Status: DC | PRN

## 2014-06-14 NOTE — Progress Notes (Signed)
Patient is alert and oriented x 4 however she needs constant reminding of the reason why her left ribs are hurting with movement.  She states "well i must have fractured them, I am not sure".  I remind her that yes it happened from her fall at home.

## 2014-06-14 NOTE — Clinical Social Work Note (Signed)
Clinical Social Worker facilitated patient discharge including contacting patient family and facility to confirm patient discharge plans.  Clinical information faxed to facility and family agreeable with plan.  CSW arranged transport with patient family to [facility].  RN to call report prior to discharge.  Clinical Social Worker will sign off for now as social work intervention is no longer needed. Please consult us again if new need arises.  Macario GoldsJesse Tejuan Gholson, [place] [phone]

## 2014-06-14 NOTE — Progress Notes (Signed)
CHART REVIEWED CONTINUE WITH CURRENT SPLINT NWB LEFT WRIST ICE/ELEVATE OT FOR DIGITAL MOTION AND EDEMA CONTROL F/U WITH ME IN OFFICE IN 2 WEEKS  CONTACT ME IF ANY QUESTIONS [phone]

## 2014-06-14 NOTE — Discharge Instructions (Signed)
KEEP BANDAGE CLEAN AND DRY CALL OFFICE FOR F/U APPT [phone] in 14 days KEEP HAND ELEVATED ABOVE HEART OK TO APPLY ICE TO OPERATIVE AREA CONTACT OFFICE IF ANY WORSENING PAIN OR CONCERNS.  ------------------------------------------------------------ Rib Fracture A rib fracture is a break or crack in one of the bones of the ribs. The ribs are a group of long, curved bones that wrap around your chest and attach to your spine. They protect your lungs and other organs in the chest cavity. A broken or cracked rib is often painful, but most do not cause other problems. Most rib fractures heal on their own over time. However, rib fractures can be more serious if multiple ribs are broken or if broken ribs move out of place and push against other structures. CAUSES   A direct blow to the chest. For example, this could happen during contact sports, a car accident, or a fall against a hard object.  Repetitive movements with high force, such as pitching a baseball or having severe coughing spells. SYMPTOMS   Pain when you breathe in or cough.  Pain when someone presses on the injured area. DIAGNOSIS  Your caregiver will perform a physical exam. Various imaging tests may be ordered to confirm the diagnosis and to look for related injuries. These tests may include a chest X-ray, computed tomography (CT), magnetic resonance imaging (MRI), or a bone scan. TREATMENT  Rib fractures usually heal on their own in 1-3 months. The longer healing period is often associated with a continued cough or other aggravating activities. During the healing period, pain control is very important. Medication is usually given to control pain. Hospitalization or surgery may be needed for more severe injuries, such as those in which multiple ribs are broken or the ribs have moved out of place.  HOME CARE INSTRUCTIONS   Avoid strenuous activity and any activities or movements that cause pain. Be careful during activities and avoid  bumping the injured rib.  Gradually increase activity as directed by your caregiver.  Only take over-the-counter or prescription medications as directed by your caregiver. Do not take other medications without asking your caregiver first.  Apply ice to the injured area for the first 1-2 days after you have been treated or as directed by your caregiver. Applying ice helps to reduce inflammation and pain.  Put ice in a plastic bag.  Place a towel between your skin and the bag.   Leave the ice on for 15-20 minutes at a time, every 2 hours while you are awake.  Perform deep breathing as directed by your caregiver. This will help prevent pneumonia, which is a common complication of a broken rib. Your caregiver may instruct you to:  Take deep breaths several times a day.  Try to cough several times a day, holding a pillow against the injured area.  Use a device called an incentive spirometer to practice deep breathing several times a day.  Drink enough fluids to keep your urine clear or pale yellow. This will help you avoid constipation.   Do not wear a rib belt or binder. These restrict breathing, which can lead to pneumonia.  SEEK IMMEDIATE MEDICAL CARE IF:   You have a fever.   You have difficulty breathing or shortness of breath.   You develop a continual cough, or you cough up thick or bloody sputum.  You feel sick to your stomach (nausea), throw up (vomit), or have abdominal pain.   You have worsening pain not controlled with medications.  MAKE SURE YOU:  Understand these instructions.  Will watch your condition.  Will get help right away if you are not doing well or get worse. Document Released: 03/15/2005 Document Revised: 11/15/2012 Document Reviewed: 05/17/2012 [facility] Patient Information 2015 [place], [place]. This information is not intended to replace advice given to you by your health care provider. Make sure you discuss any questions you have with your  health care provider.  Radial Fracture You have a broken bone (fracture) of the forearm. This is the part of your arm between the elbow and your wrist. Your forearm is made up of two bones. These are the radius and ulna. Your fracture is in the radial shaft. This is the bone in your forearm located on the thumb side. A cast or splint is used to protect and keep your injured bone from moving. The cast or splint will be on generally for about 5 to 6 weeks, with individual variations. HOME CARE INSTRUCTIONS   Keep the injured part elevated while sitting or lying down. Keep the injury above the level of your heart (the center of the chest). This will decrease swelling and pain.  Apply ice to the injury for 15-20 minutes, 03-04 times per day while awake, for 2 days. Put the ice in a plastic bag and place a towel between the bag of ice and your cast or splint.  Move your fingers to avoid stiffness and minimize swelling.  If you have a plaster or fiberglass cast:  Do not try to scratch the skin under the cast using sharp or pointed objects.  Check the skin around the cast every day. You may put lotion on any red or sore areas.  Keep your cast dry and clean.  If you have a plaster splint:  Wear the splint as directed.  You may loosen the elastic around the splint if your fingers become numb, tingle, or turn cold or blue.  Do not put pressure on any part of your cast or splint. It may break. Rest your cast only on a pillow for the first 24 hours until it is fully hardened.  Your cast or splint can be protected during bathing with a plastic bag. Do not lower the cast or splint into water.  Only take over-the-counter or prescription medicines for pain, discomfort, or fever as directed by your caregiver. SEEK IMMEDIATE MEDICAL CARE IF:   Your cast gets damaged or breaks.  You have more severe pain or swelling than you did before getting the cast.  You have severe pain when stretching your  fingers.  There is a bad smell, new stains and/or pus-like (purulent) drainage coming from under the cast.  Your fingers or hand turn pale or blue and become cold or your loose feeling. Document Released: 08/26/2005 Document Revised: 06/07/2011 Document Reviewed: 11/22/2005 [facility] Patient Information 2015 [place], [place]. This information is not intended to replace advice given to you by your health care provider. Make sure you discuss any questions you have with your health care provider.  Ulnar Fracture You have a fracture (broken bone) of the forearm. This is the part of your arm between the elbow and your wrist. Your forearm is made up of two bones. These are the radius and ulna. Your fracture is in the ulna. This is the bone in your forearm located on the little finger side of your forearm. A cast or splint is used to protect and keep your injured bone from moving. The cast or splint will be  on generally for about 5 to 6 weeks, with individual variations. HOME CARE INSTRUCTIONS   Keep the injured part elevated while sitting or lying down. Keep the injury above the level of your heart (the center of the chest). This will decrease swelling and pain.  Apply ice to the injury for 15-20 minutes, 03-04 times per day while awake, for 2 days. Put the ice in a plastic bag and place a towel between the bag of ice and your cast or splint.  Move your fingers to avoid stiffness and minimize swelling.  If you have a plaster or fiberglass cast:  Do not try to scratch the skin under the cast using sharp or pointed objects.  Check the skin around the cast every day. You may put lotion on any red or sore areas.  Keep your cast dry and clean.  If you have a plaster splint:  Wear the splint as directed.  You may loosen the elastic around the splint if your fingers become numb, tingle, or turn cold or blue.  Do not put pressure on any part of your cast or splint. It may break. Rest your cast only  on a pillow the first 24 hours until it is fully hardened.  Your cast or splint can be protected during bathing with a plastic bag. Do not lower the cast or splint into water.  Only take over-the-counter or prescription medicines for pain, discomfort, or fever as directed by your caregiver. SEEK IMMEDIATE MEDICAL CARE IF:   Your cast gets damaged or breaks.  You have more severe pain or swelling than you did before the cast.  You have severe pain when stretching your fingers.  There is a bad smell or new stains and/or purulent (pus like) drainage coming from under the cast. Document Released: 08/26/2005 Document Revised: 06/07/2011 Document Reviewed: 01/28/2007 [employer] Patient Information 2015 [employer]. This information is not intended to replace advice given to you by your health care provider. Make sure you discuss any questions you have with your health care provider.

## 2014-06-14 NOTE — Progress Notes (Signed)
Report called to Anntonette at [facility]. 

## 2014-06-14 NOTE — Progress Notes (Signed)
Patient refused bath this morning.

## 2014-06-14 NOTE — Discharge Summary (Signed)
[facility] Trauma [facility] Discharge Summary   Patient ID: Yvonne [name] MRN: [id number] DOB/AGE: 1918/04/05 81 y.o.  Admit date: 06/12/2014 Discharge date: 06/14/2014  Discharge Diagnoses Patient Active Problem List   Diagnosis Date Noted  . Distal radius fracture, left 06/12/2014  . Wrist fracture, closed 06/12/2014  . Other specified cardiac dysrhythmias(427.89) 04/08/2013  . Hypomagnesemia 04/07/2013  . Abnormal urinalysis 04/05/2013  . Dehydration 04/05/2013  . Acute renal failure 04/05/2013  . Orthostatic hypotension 04/05/2013  . Hypokalemia 04/05/2013  . Acute CVA (cerebrovascular accident)-nonhemorrhagic 04/04/2013    Consultants Dr. Orlan Leavens (Orthopedics)   Procedures Dr. Orlan Leavens (06/13/14): 1. Open treatment of left wrist intraarticular distal radius fracture, 3 or more fragments following internal fixation. 2. Left upper brachioradialis tendon release, tendon lengthening. 3. Left wrist radiographs, 3 views, left wrist.   Hospital Course:  81 year old female who while walking her dog today stumbled on a rock and fell onto her left side. She has had pain in her left wrist and some pain in her lower left chest with deep inspiration. She is a little confused about the exact events and per her friend has some baseline memory problems. There was no syncope or loss of consciousness.  Workup showed fracture of left distal radius, ulna and left anterior 8th rib fracture, and abrasion to left knee.  Patient was admitted and underwent procedure listed above.  Tolerated procedure well and was transferred to the floor.  She was maintained in a sling.  NWB through left wrist.  PT/OT was consulted and recommended SNF at discharge for rehab.  Diet was advanced as tolerated.  On HD #3, the patient was voiding well, tolerating diet, ambulating well, pain well controlled, vital signs stable, incisions c/d/i and felt stable for discharge home.  Patient will follow up in  Dr. Tonna Corner office in 2 weeks (can be reached at [phone]) as well as her PCP's office.  She knows to call our office with questions or concerns.  She is encouraged to ice/elevate her left arm.  OT is encouraged to do digital motion for edema control.  Continue with current splint until post op visit.  She can resume plavix.  She will be discharged to SNF today.       Medication List    TAKE these medications        albuterol 108 (90 BASE) MCG/ACT inhaler  Commonly known as:  PROVENTIL HFA;VENTOLIN HFA  Inhale 2 puffs into the lungs every 6 (six) hours as needed for wheezing or shortness of breath.     clopidogrel 75 MG tablet  Commonly known as:  PLAVIX  Take 1 tablet (75 mg total) by mouth daily with breakfast.     diltiazem 240 MG 24 hr capsule  Commonly known as:  CARDIZEM CD  Take 1 capsule (240 mg total) by mouth daily. Start 04/10/13     docusate sodium 100 MG capsule  Commonly known as:  COLACE  Take 1 capsule (100 mg total) by mouth 2 (two) times daily as needed for mild constipation.     metoprolol 50 MG tablet  Commonly known as:  LOPRESSOR  Take 50 mg by mouth 2 (two) times daily.     mometasone-formoterol 200-5 MCG/ACT Aero  Commonly known as:  DULERA  Inhale 2 puffs into the lungs 2 (two) times daily.     multivitamin with minerals Tabs tablet  Take 1 tablet by mouth daily.     polyethylene glycol packet  Commonly known as:  MIRALAX /  GLYCOLAX  Take 17 g by mouth daily as needed.     sertraline 50 MG tablet  Commonly known as:  ZOLOFT  Take 50 mg by mouth every morning.     simvastatin 40 MG tablet  Commonly known as:  ZOCOR  Take 40 mg by mouth at bedtime.         Follow-up Information    Follow up with Sharma CovertTMANN,FRED W, MD. Schedule an appointment as soon as possible for a visit in 2 weeks.   Specialty:  Orthopedic Surgery   Why:  For post-operation check for your left forearm/wrist surgery   Contact information:   [address] [place] [address] [phone]       Follow up with Gaye AlkenBARNES,ELIZABETH STEWART, MD. Schedule an appointment as soon as possible for a visit in 2 weeks.   Specialty:  Family Medicine   Why:  For post-hospital follow up   Contact information:   [address] [place] [address] [phone]       Follow up with [facility].   Why:  As needed   Contact information:   [address] [place] [phone]      Signed: Rueben BashMegan N. Dort, [facility]  Trauma Service [phone]  06/14/2014, 8:41 AM

## 2014-06-14 NOTE — Progress Notes (Signed)
[facility]  Progress Note   LOS: 2 days   Subjective: Pt feels pretty good.  Pain well controlled.  Tolerating diet well.  BM on 06/11/14.  Ambulating well OOB, urinating well.  C/o mild left forearm/wrist pain and left rib pain  Objective: Vital signs in last 24 hours: Temp:  [98.1 F (36.7 C)-99.5 F (37.5 C)] 98.7 F (37.1 C) (03/18 0538) Pulse Rate:  [66-79] 78 (03/18 0538) Resp:  [16-18] 16 (03/18 0538) BP: (149-167)/(59-65) 149/63 mmHg (03/18 0538) SpO2:  [87 %-97 %] 93 % (03/18 0538) Last BM Date: 06/11/14  Lab Results:  CBC  Recent Labs  06/12/14 1542 06/12/14 1622 06/13/14 0642  WBC 7.8  --  7.4  HGB 12.6 12.9 11.2*  HCT 37.5 38.0 34.3*  PLT 130*  --  129*   BMET  Recent Labs  06/12/14 1542 06/12/14 1622 06/13/14 0642  NA 138 137 136  K 5.1 4.2 4.4  CL 101 99 101  CO2 26  --  22  GLUCOSE 115* 117* 144*  BUN 29* 28* 22  CREATININE 1.33* 1.30* 1.32*  CALCIUM 9.4  --  8.6    Imaging: Dg Ribs Unilateral W/chest Left  06/12/2014   CLINICAL DATA:  Patient fell on left side while walking dog. Left-sided pain  EXAM: LEFT RIBS AND CHEST - 3+ VIEW  COMPARISON:  Chest radiograph April 05, 2013  FINDINGS: Frontal chest as well as oblique and cone-down lateral rib images obtained. There is underlying emphysematous change. There is no edema or consolidation. Heart size and pulmonary vascularity are normal. There is scarring at the level of the anterior left first rib.  Aorta is tortuous with atherosclerotic change throughout the thoracic aorta.  There are surgical clips the right neck region.  There is an acute nondisplaced fracture of the anterior left eighth rib. No other acute fracture seen. There old healed rib fractures on the right. Patient is status post previous kyphoplasty procedure at L4.  IMPRESSION: Acute nondisplaced fracture anterior left eighth rib. No pneumothorax or effusion. Underlying emphysema. Evidence of old rib trauma  on the right with healing.   Electronically Signed   By: Bretta Bang III M.D.   On: 06/12/2014 14:25   Dg Forearm Left  06/12/2014   CLINICAL DATA:  81 year old female fell while walking dog with pain and swelling. Initial encounter.  EXAM: LEFT FOREARM - 2 VIEW  COMPARISON:  Left wrist series from the same day reported separately.  FINDINGS: Distal left radius and ulna fractures as described on the comparison from today.  The more proximal left radius and ulna appear intact. Grossly normal alignment at the left elbow.  IMPRESSION: 1. Distal left radius and ulna fractures as described on the left wrist series. 2. No more proximal left forearm fracture identified.   Electronically Signed   By: Odessa Fleming M.D.   On: 06/12/2014 12:58   Dg Wrist Complete Left  06/12/2014   CLINICAL DATA:  81 year old female fell while walking dog outside. Swelling and pain. Initial encounter.  EXAM: LEFT WRIST - COMPLETE 3+ VIEW  COMPARISON:  None.  FINDINGS: Generalized soft tissue swelling. Comminuted and impacted distal right radius fracture with DRU and radiocarpal joint involvement. Volar impaction and angulation. Over riding of fracture fragments up to 12 mm.  Mildly displaced ulnar styloid fracture.  Carpal bone alignment appears preserved. Metacarpals appear intact. Degenerative changes at the basal joint of the left thumb.  IMPRESSION: 1. Severely comminuted and dorsally  impacted distal left radius fracture with DRU and radiocarpal joint involvement. 2. Ulnar styloid fracture.   Electronically Signed   By: Odessa FlemingH  Hall M.D.   On: 06/12/2014 12:57   Dg Chest Port 1 View  06/13/2014   CLINICAL DATA:  81 year old female with a history of left rib fractures.  EXAM: PORTABLE CHEST - 1 VIEW  COMPARISON:  06/12/2014  FINDINGS: Cardiomediastinal silhouette unchanged in size and contour. Atherosclerotic calcifications of the aortic arch.  No confluent airspace disease. No pneumothorax. Hazy opacity in the retrocardiac region.  No  pulmonary vascular congestion.  Nondisplaced left eighth rib fracture not as well visualized on the current study.  Posttraumatic deformity of the right chest wall again noted.  Surgical clips at the inferior aspect of the right neck.  IMPRESSION: No radiographic evidence of acute cardiopulmonary disease, with likely bibasilar atelectasis.  Atherosclerosis.  Nondisplaced fracture of the left eighth rib not as well visualized on the current study.  Signed,  Yvone NeuJaime S. Loreta AveWagner, DO  [facility]  [facility]   Electronically Signed   By: Gilmer MorJaime  Wagner D.O.   On: 06/13/2014 07:47     PE: General: pleasant, WD/WN white female who is laying in bed in NAD HEENT: head is normocephalic, atraumatic. Sclera are noninjected. PERRL. Ears and nose without any masses or lesions. Mouth is pink and moist Heart: regular, rate, and rhythm. Normal s1,s2. No obvious murmurs, gallops, or rubs noted. Palpable radial and pedal pulses bilaterally Lungs: CTAB, no wheezes, rhonchi, or rales noted. Respiratory effort nonlabored, left ribs ttp Abd: soft, NT/ND, +BS, no masses, hernias, or organomegaly MS: Left arm in sling and ace wrap s/p ORIF forearm/wrist, distal CSM intact to all 4 extremities, swelling in the left hand/fingers Skin: warm and dry, abrasion to left knee Psych: A&Ox3 with an appropriate affect.   Assessment/Plan: HD #3 Ground level fall (tripped) Left distal radius/ulna fx - s/p ORIF Dr. Orlan Leavensrtman, f/u in 2 weeks, therapy to start at 4 weeks, NWB left wrist Left 8th Rib fx - pulm toilet, nebs Abrasion to left knee  HLD/HTN - resumed home meds VTE - SCD's, SQ heparin, normally on Plavix, will see how long she needs to be held off this FEN - reg diet, bowel regimen Dispo -- PT/OT, SNF at discharge, today if bed available   Candiss NorseMegan Dort, PA-C Pager: [phone] General Trauma PA Pager: [phone]   06/14/2014

## 2014-08-05 ENCOUNTER — Encounter (HOSPITAL_COMMUNITY): Payer: Self-pay | Admitting: Internal Medicine

## 2014-08-05 ENCOUNTER — Observation Stay (HOSPITAL_COMMUNITY)
Admission: AD | Admit: 2014-08-05 | Discharge: 2014-08-07 | Disposition: A | Discharge status: Skilled Nursing Facility | Payer: Medicare Other | Source: Ambulatory Visit | Attending: Internal Medicine | Admitting: Internal Medicine

## 2014-08-05 ENCOUNTER — Observation Stay (HOSPITAL_COMMUNITY): Payer: Medicare Other

## 2014-08-05 DIAGNOSIS — Z8673 Personal history of transient ischemic attack (TIA), and cerebral infarction without residual deficits: Secondary | ICD-10-CM

## 2014-08-05 DIAGNOSIS — J449 Chronic obstructive pulmonary disease, unspecified: Secondary | ICD-10-CM | POA: Diagnosis not present

## 2014-08-05 DIAGNOSIS — I129 Hypertensive chronic kidney disease with stage 1 through stage 4 chronic kidney disease, or unspecified chronic kidney disease: Secondary | ICD-10-CM | POA: Diagnosis not present

## 2014-08-05 DIAGNOSIS — R413 Other amnesia: Secondary | ICD-10-CM

## 2014-08-05 DIAGNOSIS — R296 Repeated falls: Secondary | ICD-10-CM

## 2014-08-05 DIAGNOSIS — R4189 Other symptoms and signs involving cognitive functions and awareness: Secondary | ICD-10-CM | POA: Diagnosis not present

## 2014-08-05 DIAGNOSIS — Z87891 Personal history of nicotine dependence: Secondary | ICD-10-CM | POA: Insufficient documentation

## 2014-08-05 DIAGNOSIS — N183 Chronic kidney disease, stage 3 (moderate): Secondary | ICD-10-CM | POA: Diagnosis not present

## 2014-08-05 DIAGNOSIS — Z66 Do not resuscitate: Secondary | ICD-10-CM | POA: Insufficient documentation

## 2014-08-05 DIAGNOSIS — R350 Frequency of micturition: Secondary | ICD-10-CM | POA: Diagnosis not present

## 2014-08-05 DIAGNOSIS — I471 Supraventricular tachycardia: Secondary | ICD-10-CM | POA: Insufficient documentation

## 2014-08-05 DIAGNOSIS — Z9181 History of falling: Secondary | ICD-10-CM | POA: Insufficient documentation

## 2014-08-05 DIAGNOSIS — M25562 Pain in left knee: Secondary | ICD-10-CM

## 2014-08-05 DIAGNOSIS — Z7902 Long term (current) use of antithrombotics/antiplatelets: Secondary | ICD-10-CM | POA: Diagnosis not present

## 2014-08-05 DIAGNOSIS — G3184 Mild cognitive impairment, so stated: Secondary | ICD-10-CM | POA: Diagnosis present

## 2014-08-05 LAB — CBC
HEMATOCRIT: 34.6 % — AB (ref 36.0–46.0)
HEMOGLOBIN: 11.9 g/dL — AB (ref 12.0–15.0)
MCH: 31.2 pg (ref 26.0–34.0)
MCHC: 34.4 g/dL (ref 30.0–36.0)
MCV: 90.8 fL (ref 78.0–100.0)
Platelets: 139 10*3/uL — ABNORMAL LOW (ref 150–400)
RBC: 3.81 MIL/uL — AB (ref 3.87–5.11)
RDW: 13.4 % (ref 11.5–15.5)
WBC: 5.6 10*3/uL (ref 4.0–10.5)

## 2014-08-05 LAB — COMPREHENSIVE METABOLIC PANEL
ALK PHOS: 42 U/L (ref 38–126)
ALT: 16 U/L (ref 14–54)
AST: 22 U/L (ref 15–41)
Albumin: 3.4 g/dL — ABNORMAL LOW (ref 3.5–5.0)
Anion gap: 10 (ref 5–15)
BUN: 29 mg/dL — ABNORMAL HIGH (ref 6–20)
CO2: 26 mmol/L (ref 22–32)
Calcium: 8.7 mg/dL — ABNORMAL LOW (ref 8.9–10.3)
Chloride: 102 mmol/L (ref 101–111)
Creatinine, Ser: 1.5 mg/dL — ABNORMAL HIGH (ref 0.44–1.00)
GFR calc Af Amer: 33 mL/min — ABNORMAL LOW (ref >60)
GFR calc non Af Amer: 29 mL/min — ABNORMAL LOW (ref >60)
Glucose, Bld: 181 mg/dL — ABNORMAL HIGH (ref 70–99)
Potassium: 3.6 mmol/L (ref 3.5–5.1)
SODIUM: 138 mmol/L (ref 135–145)
TOTAL PROTEIN: 6.3 g/dL — AB (ref 6.5–8.1)
Total Bilirubin: 0.6 mg/dL (ref 0.3–1.2)

## 2014-08-05 LAB — MAGNESIUM: MAGNESIUM: 1.7 mg/dL (ref 1.7–2.4)

## 2014-08-05 LAB — VITAMIN B12: Vitamin B-12: 653 pg/mL (ref 180–914)

## 2014-08-05 LAB — TSH: TSH: 2.152 u[IU]/mL (ref 0.350–4.500)

## 2014-08-05 MED ORDER — DOCUSATE SODIUM 100 MG PO CAPS: 100.0000 mg | ORAL_CAPSULE | Freq: Two times a day (BID) | ORAL | Status: DC | PRN

## 2014-08-05 MED ORDER — ONDANSETRON HCL 4 MG/2ML IJ SOLN: 4.0000 mg | Freq: Four times a day (QID) | INTRAMUSCULAR | Status: DC | PRN

## 2014-08-05 MED ORDER — MOMETASONE FURO-FORMOTEROL FUM 200-5 MCG/ACT IN AERO
2.0000 | INHALATION_SPRAY | Freq: Two times a day (BID) | RESPIRATORY_TRACT | Status: DC
  Administered 2014-08-05 – 2014-08-07 (×4): 2 via RESPIRATORY_TRACT
  Filled 2014-08-05: qty 8.8

## 2014-08-05 MED ORDER — ATORVASTATIN CALCIUM 10 MG PO TABS
20.0000 mg | ORAL_TABLET | Freq: Every day | ORAL | Status: DC
  Administered 2014-08-05 – 2014-08-06 (×2): 20 mg via ORAL
  Filled 2014-08-05 (×2): qty 2

## 2014-08-05 MED ORDER — SODIUM CHLORIDE 0.9 % IV SOLN
INTRAVENOUS | Status: DC
  Administered 2014-08-05: 18:00:00 via INTRAVENOUS

## 2014-08-05 MED ORDER — SIMVASTATIN 40 MG PO TABS: 40.0000 mg | ORAL_TABLET | Freq: Every day | ORAL | Status: DC

## 2014-08-05 MED ORDER — SERTRALINE HCL 50 MG PO TABS
50.0000 mg | ORAL_TABLET | Freq: Every morning | ORAL | Status: DC
  Administered 2014-08-06 – 2014-08-07 (×2): 50 mg via ORAL
  Filled 2014-08-05 (×2): qty 1

## 2014-08-05 MED ORDER — CLOPIDOGREL BISULFATE 75 MG PO TABS
75.0000 mg | ORAL_TABLET | Freq: Every day | ORAL | Status: DC
  Administered 2014-08-06 – 2014-08-07 (×2): 75 mg via ORAL
  Filled 2014-08-05 (×2): qty 1

## 2014-08-05 MED ORDER — DOCUSATE SODIUM 100 MG PO CAPS
100.0000 mg | ORAL_CAPSULE | Freq: Two times a day (BID) | ORAL | Status: DC
  Administered 2014-08-05 – 2014-08-07 (×4): 100 mg via ORAL
  Filled 2014-08-05 (×4): qty 1

## 2014-08-05 MED ORDER — SODIUM CHLORIDE 0.9 % IJ SOLN
3.0000 mL | Freq: Two times a day (BID) | INTRAMUSCULAR | Status: DC
  Administered 2014-08-05 – 2014-08-06 (×2): 3 mL via INTRAVENOUS

## 2014-08-05 MED ORDER — ADULT MULTIVITAMIN W/MINERALS CH
1.0000 | ORAL_TABLET | Freq: Every day | ORAL | Status: DC
  Administered 2014-08-06 – 2014-08-07 (×2): 1 via ORAL
  Filled 2014-08-05 (×2): qty 1

## 2014-08-05 MED ORDER — POLYETHYLENE GLYCOL 3350 17 G PO PACK: 17.0000 g | PACK | Freq: Every day | ORAL | Status: DC | PRN

## 2014-08-05 MED ORDER — DILTIAZEM HCL ER COATED BEADS 240 MG PO CP24
240.0000 mg | ORAL_CAPSULE | Freq: Every day | ORAL | Status: DC
  Administered 2014-08-06 – 2014-08-07 (×2): 240 mg via ORAL
  Filled 2014-08-05 (×2): qty 1

## 2014-08-05 MED ORDER — LORAZEPAM 0.5 MG PO TABS
0.5000 mg | ORAL_TABLET | Freq: Once | ORAL | Status: AC
  Administered 2014-08-06: 0.5 mg via ORAL
  Filled 2014-08-05: qty 1

## 2014-08-05 MED ORDER — ACETAMINOPHEN 325 MG PO TABS: 650.0000 mg | ORAL_TABLET | Freq: Four times a day (QID) | ORAL | Status: DC | PRN

## 2014-08-05 MED ORDER — METOPROLOL TARTRATE 50 MG PO TABS
50.0000 mg | ORAL_TABLET | Freq: Two times a day (BID) | ORAL | Status: DC
  Administered 2014-08-05 – 2014-08-06 (×3): 50 mg via ORAL
  Filled 2014-08-05 (×3): qty 1

## 2014-08-05 MED ORDER — ACETAMINOPHEN 650 MG RE SUPP: 650.0000 mg | Freq: Four times a day (QID) | RECTAL | Status: DC | PRN

## 2014-08-05 MED ORDER — ONDANSETRON HCL 4 MG PO TABS: 4.0000 mg | ORAL_TABLET | Freq: Four times a day (QID) | ORAL | Status: DC | PRN

## 2014-08-05 MED ORDER — ALBUTEROL SULFATE (2.5 MG/3ML) 0.083% IN NEBU: 2.5000 mg | INHALATION_SOLUTION | RESPIRATORY_TRACT | Status: DC | PRN

## 2014-08-05 NOTE — Progress Notes (Signed)
EEG completed; results pending.    

## 2014-08-05 NOTE — Progress Notes (Signed)
Received from admitting via wheelchair; direct admit from doctor's office; admissions MD notified; patient is alert and oriented; has bilateral hearing aids; friend at bedside; oriented to room and unit routine; fall safety measures in place and bed alarm is set.

## 2014-08-05 NOTE — H&P (Signed)
[facility] History and Physical  [name] EAV:[id number] DOB: January 20, 1919 DOA: 08/05/2014   PCP: Gaye Alken, MD  Specialists: Ortho for recent distal radius fracture on left  Chief Complaint: Falling frequently  HPI: [name] is a 81 y.o. female with a past medical history of stroke, hypertension, recent hospitalization in March after she fell and sustained a fracture of her left distal radius, ulna, as well as left anterior eighth rib. She underwent open reduction and internal fixation of the fracture of the left wrist. She subsequently went to [facility] for rehabilitation. About 3 weeks ago she went back home. She does live by herself. Today she is accompanied by her close friend, who provided most of the history as the patient does have history of memory loss which has become worse more recently. According to this friend patient has had a few episodes where she would have blank stares. And then she had about 4 falls yesterday including overnight. No syncopal episodes. And these falls did not result in any head injuries. The fall was described as slumping down on the floor. She had to get up in the night frequently for urination which is something unusual. She denies any painful urination. No fever, no chills. No nausea or vomiting. Denies any dizziness or lightheadedness currently. She was taken to her primary care physician's office this morning and it was felt that the patient required direct admission for further workup. Once again, no history was available from the patient. She is pleasantly confused.  Home Medications: Prior to Admission medications   Medication Sig Start Date End Date Taking? Authorizing Provider  acetaminophen (TYLENOL) 325 MG tablet Take 2 tablets (650 mg total) by mouth every 6 (six) hours as needed for fever, headache, mild pain or moderate pain. 06/14/14   Megan N Dort, PA-C  albuterol (PROVENTIL HFA;VENTOLIN HFA) 108 (90 BASE)  MCG/ACT inhaler Inhale 2 puffs into the lungs every 6 (six) hours as needed for wheezing or shortness of breath. Patient not taking: Reported on 06/12/2014 04/09/13   Catarina Hartshorn, MD  clopidogrel (PLAVIX) 75 MG tablet Take 1 tablet (75 mg total) by mouth daily with breakfast. 04/09/13   Catarina Hartshorn, MD  diltiazem (CARDIZEM CD) 240 MG 24 hr capsule Take 1 capsule (240 mg total) by mouth daily. Start 04/10/13 04/10/13   Catarina Hartshorn, MD  docusate sodium (COLACE) 100 MG capsule Take 1 capsule (100 mg total) by mouth 2 (two) times daily as needed for mild constipation. 06/14/14   Megan N Dort, PA-C  metoprolol (LOPRESSOR) 50 MG tablet Take 50 mg by mouth 2 (two) times daily.    Historical Provider, MD  mometasone-formoterol (DULERA) 200-5 MCG/ACT AERO Inhale 2 puffs into the lungs 2 (two) times daily. Patient not taking: Reported on 06/12/2014 04/09/13   Catarina Hartshorn, MD  Multiple Vitamin (MULTIVITAMIN WITH MINERALS) TABS tablet Take 1 tablet by mouth daily.    Historical Provider, MD  polyethylene glycol (MIRALAX / GLYCOLAX) packet Take 17 g by mouth daily as needed. 06/14/14   Megan N Dort, PA-C  sertraline (ZOLOFT) 50 MG tablet Take 50 mg by mouth every morning.    Historical Provider, MD  simvastatin (ZOCOR) 40 MG tablet Take 40 mg by mouth at bedtime.    Historical Provider, MD  traMADol (ULTRAM) 50 MG tablet Take 1-2 tablets (50-100 mg total) by mouth every 6 (six) hours as needed for moderate pain or severe pain. 06/14/14   Rueben Bash Dort, PA-C    Allergies:  No Known Allergies  Past Medical History: Past Medical History  Diagnosis Date  . Hypertension   . Hypercholesteremia   . ARF (acute renal failure)   . TIA (transient ischemic attack)     Past Surgical History  Procedure Laterality Date  . Back surgery    . Orif wrist fracture Left 06/12/2014  . Orif wrist fracture Left 06/12/2014    Procedure: OPEN REDUCTION INTERNAL FIXATION (ORIF) WRIST FRACTURE;  Surgeon: Bradly BienenstockFred Ortmann, MD;  Location: [facility] OR;   Service: Orthopedics;  Laterality: Left;    Social History: She lives alone, but her friends take care of her and someone stays with her every night. She doesn't have any family. Uses walker to ambulate. Has a significant history of smoking in the past but none currently. No history of alcohol use.  Family History:  Family History  Problem Relation Age of Onset  . Hypertension Mother   . Hypertension Father      Review of Systems - unable to do due to cognitive impairment  Physical Examination  Filed Vitals:   08/05/14 1143  BP: 141/67  Pulse: 88  Temp: 98.8 F (37.1 C)  TempSrc: Oral  Resp: 18  SpO2: 100%    BP 141/67 mmHg  Pulse 88  Temp(Src) 98.8 F (37.1 C) (Oral)  Resp 18  SpO2 100%  General appearance: alert, appears stated age, distracted and no distress Head: Normocephalic, without obvious abnormality, atraumatic Eyes: conjunctivae/corneas clear. PERRL, EOM's intact. Fundi benign. Throat: lips, mucosa, and tongue normal; teeth and gums normal Neck: no adenopathy, no carotid bruit, no JVD, supple, symmetrical, trachea midline and thyroid not enlarged, symmetric, no tenderness/mass/nodules Resp: clear to auscultation bilaterally Cardio: regular rate and rhythm, S1, S2 normal, no murmur, click, rub or gallop GI: soft, non-tender; bowel sounds normal; no masses,  no organomegaly Extremities: extremities normal, atraumatic, no cyanosis or edema Pulses: 2+ and symmetric Skin: Skin color, texture, turgor normal. No rashes or lesions Lymph nodes: Cervical, supraclavicular, and axillary nodes normal. Neurologic: Alert. He is alert. She is oriented to person, month. Could not tell me the year or the date. [place] She did not know that she was in the hospital. Tongue is midline. No facial asymmetry. Motor strength is equal bilateral upper and lower extremities.  Laboratory Data: No results found for this or any previous visit (from  the past 48 hour(s)). Blood work is pending  Radiology Reports: No results found. MRI brain is pending  Problem List  Principal Problem:   Frequent falls Active Problems:   History of CVA (cerebrovascular accident)   Frequency of urination   Cognitive impairment   COPD (chronic obstructive pulmonary disease)   Assessment: This is a 81 year old Caucasian female who presents after frequent falls starting yesterday. She is set frequent urination so UTIs a possibility. Her friends also describes episodes where patient has a blank stare for a few seconds. Absence seizure is a possibility. There is also report of worsening memory impairment over the last few weeks.  Plan: #1 Frequent falls with frequent urination overnight: Check UA. PT and OT will be consulted. Due to presence of worsening memory loss and more frequent falls we'll proceed with MRI of the brain. She was admitted for an acute stroke in January of this year. EEG due to history of blank stares.  #2 History of stroke: Continue with Plavix. PT and OT to see. I don't see any new focal deficits on examiantion.  #  3 Cognitive impairment: MRI from January revealed atrophy. She could have vascular dementia. Will check B-12, TSH, RPR and HIV. PT/OT. She may need placement.  #4 history of COPD: Continue with her home medications.  #5 history of hypertension: Continue with the Cardizem and metoprolol.  #6 history of multifocal atrial tachycardia: Continue with the beta blocker and calcium channel blocker.   DVT Prophylaxis: SCDs for now Code Status: DO NOT RESUSCITATE Family Communication: Discussed with the patient and her close friend. He does not have any family per the friend. Disposition Plan: Observe to telemetry   Further management decisions will depend on results of further testing and patient's response to treatment.   [facility] Ihs Indian HospitalKRISHNAN,Jackolyn Geron  [facility] Pager [phone]  If 7PM-7AM, please contact  night-coverage [email or web address] Password [email or web address]  08/05/2014, 12:36 PM

## 2014-08-05 NOTE — Evaluation (Signed)
Physical Therapy Evaluation Patient Details Name: Yvonne DrossVirginia K Shinall MRN: [id number] DOB: 06/03/1918 Today's Date: 08/05/2014   History of Present Illness  81 y.o. female with a past medical history of stroke, hypertension, recent hospitalization in March after she fell and sustained a fracture of her left distal radius, ulna, as well as left anterior eighth rib. She subsequently went to [facility] for rehabilitation. About 3 weeks ago she went back home. Patient brought to the hospital by close friend secondary to 4 falls 5/8, including some overnight.  Clinical Impression  Patient demonstrates deficits in functional mobility as indicated below. Will need continued skilled PT to address deficits and maximize function. Will see as indicated and progress as tolerated. Patient was Mod I prior to admission, with recent falls and current deficits in mobility/safety and cognition, feel patient will need continued assist/supervision upon discharge. Highly recommend ST SNF. If patient progresses well, may consider ALF.    Follow Up Recommendations SNF;Supervision/Assistance - 24 hour (vs assisted living pending progress)    Equipment Recommendations  None recommended by PT    Recommendations for Other Services       Precautions / Restrictions Precautions Precautions: Fall Restrictions Weight Bearing Restrictions: No Other Position/Activity Restrictions: Patient with recent ORIF > left wrist. According to family/friends, cast removed just a few days ago. No restricitions or WB status in chart or orders.       Mobility  Bed Mobility Overal bed mobility: Needs Assistance Bed Mobility: Rolling;Sidelying to Sit Rolling: Supervision Sidelying to sit: Supervision       General bed mobility comments: supervision for safety  Transfers Overall transfer level: Needs assistance Equipment used: Rolling walker (2 wheeled) Transfers: Sit to/from Stand Sit to Stand: Min guard         General  transfer comment: Cues for safety and technique. Pt with poor RW safety awareness   Ambulation/Gait Ambulation/Gait assistance: Min assist;Mod assist Ambulation Distance (Feet): 180 Feet Assistive device: Rolling walker (2 wheeled);1 person hand held assist Gait Pattern/deviations: Step-through pattern;Decreased stride length;Drifts right/left;Trunk flexed Gait velocity: decreased Gait velocity interpretation: Below normal speed for age/gender General Gait Details: instability noted with poor awareness of safety and proper use of RW. Attempted ambulation of 40 ft without RW, patient significantly unsteady requiring increased assist and max VCs for candence and control of gait.   Stairs            Wheelchair Mobility    Modified Rankin (Stroke Patients Only)       Balance Overall balance assessment: Needs assistance Sitting-balance support: No upper extremity supported;Feet supported Sitting balance-Leahy Scale: Good     Standing balance support: During functional activity;No upper extremity supported Standing balance-Leahy Scale: Poor Standing balance comment: Patient with good>fair standing balance with BUEs supported during functional activity                             Pertinent Vitals/Pain Pain Assessment: Faces Pain Score: 4  Faces Pain Scale: Hurts little more Pain Location: left leg behind the knee Pain Descriptors / Indicators: Discomfort;Aching Pain Intervention(s): Monitored during session;Repositioned    Home Living Family/patient expects to be discharged to:: Other (Comment) (Pt would like to go to [facility] as she has been there ) Living Arrangements: Alone Available Help at Discharge: Family Type of Home: House Home Access: Stairs to enter Entrance Stairs-Rails: Can reach both Entrance Stairs-Number of Steps: 4 Home Layout: One level Home Equipment: Walker - 2 wheels;Shower  seat Additional Comments: walk in shower with standard  height toilet    Prior Function                 Hand Dominance   Dominant Hand: Right    Extremity/Trunk Assessment   Upper Extremity Assessment: Generalized weakness;LUE deficits/detail       LUE Deficits / Details: LUE fracture s/p ORIF, generalized weakness in wrist/hand   Lower Extremity Assessment: LLE deficits/detail   LLE Deficits / Details: pain LLE behind the knee, and pain at ankle. all ROM and strength WFL  Cervical / Trunk Assessment: Normal  Communication   Communication: HOH  Cognition Arousal/Alertness: Awake/alert Behavior During Therapy: WFL for tasks assessed/performed Overall Cognitive Status: Impaired/Different from baseline Area of Impairment: Attention;Following commands;Safety/judgement;Awareness;Problem solving   Current Attention Level: Sustained Memory: Decreased short-term memory         General Comments: family reports patient has been confabulatory    General Comments      Exercises        Assessment/Plan    PT Assessment Patient needs continued PT services  PT Diagnosis Difficulty walking;Abnormality of gait;Acute pain   PT Problem List Decreased strength;Decreased activity tolerance;Decreased balance;Decreased mobility;Decreased coordination;Decreased cognition;Decreased safety awareness;Pain  PT Treatment Interventions DME instruction;Gait training;Stair training;Functional mobility training;Therapeutic activities;Therapeutic exercise;Balance training;Patient/family education   PT Goals (Current goals can be found in the Care Plan section) Acute Rehab PT Goals Patient Stated Goal: none stated PT Goal Formulation: With patient/family Time For Goal Achievement: 08/19/14 Potential to Achieve Goals: Good    Frequency Min 2X/week   Barriers to discharge Decreased caregiver support      Co-evaluation PT/OT/SLP Co-Evaluation/Treatment: Yes Reason for Co-Treatment: Necessary to address cognition/behavior during  functional activity;For patient/therapist safety PT goals addressed during session: Balance;Mobility/safety with mobility OT goals addressed during session: ADL's and self-care;Strengthening/ROM       End of Session Equipment Utilized During Treatment: Gait belt Activity Tolerance: Patient tolerated treatment well Patient left: in bed;with call bell/phone within reach;with bed alarm set;with family/visitor present Nurse Communication: Mobility status         Time: [id number] PT Time Calculation (min) (ACUTE ONLY): 20 min   Charges:   PT Evaluation $Initial PT Evaluation Tier I: 1 Procedure     PT G Codes:   PT G-Codes **NOT FOR INPATIENT CLASS** Functional Assessment Tool Used: clinical judgement Functional Limitation: Mobility: Walking and moving around Mobility: Walking and Moving Around Current Status ([id number](G8978): At least 20 percent but less than 40 percent impaired, limited or restricted Mobility: Walking and Moving Around Goal Status [phone](G8979): At least 1 percent but less than 20 percent impaired, limited or restricted    Fabio AsaWerner, Kimberlly Norgard J 08/05/2014, 3:17 PM Charlotte Crumbevon Mystery Schrupp, PT DPT  [phone]

## 2014-08-05 NOTE — Procedures (Signed)
ELECTROENCEPHALOGRAM REPORT  Patient: Yvonne [name]       Room #: 4N16 EEG No. ID: [id number] Age: 81 y.o.        Sex: female Referring Physician: Barnie DelKRISHNAN, G Report Date:  08/05/2014        Interpreting Physician: Aline BrochureSTEWART,Christabell Loseke R  History: Yvonne DrossVirginia K Seoane is an 81 y.o. female admitted with possible urinary tract infection following recurrent falls. There is also history of recurrent staring spells and progressive memory difficulty.  Indications for study:  Rule out slowing of cerebral activity; rule out focal seizure activity.  Technique: This is an 18 channel routine scalp EEG performed at the bedside with bipolar and monopolar montages arranged in accordance to the international 10/20 system of electrode placement.   Description: This EEG recording was performed during wakefulness. The dominant background activity consisted of 8-9 Hz symmetrical alpha rhythm with good attenuation with eye opening. Photic stimulation and hyperventilation were not performed. No epileptiform discharges were recorded. There was no abnormal slowing of cerebral activity.  Interpretation: This is a normal EEG recording during wakefulness. No evidence of encephalopathic process nor lesion disorder demonstrated.   Venetia MaxonR Tracie Lindbloom M.D. [facility] [phone]

## 2014-08-05 NOTE — Progress Notes (Signed)
Occupational Therapy Evaluation Patient Details Name: Yvonne [name] MRN: [id number] DOB: 06/17/1918 Today's Date: 08/05/2014    History of Present Illness 81 y.o. female with a past medical history of stroke, hypertension, recent hospitalization in March after she fell and sustained a fracture of her left distal radius, ulna, as well as left anterior eighth rib. She subsequently went to [facility] for rehabilitation. About 3 weeks ago she went back home. Patient brought to the hospital by close friend secondary to 4 falls 5/8, including some overnight.   Clinical Impression   Patient mod I > supervision PTA. Patient lives alone and has intermittent supervision/assistance. Patient currently functioning at an overall min guard>min assist level. Patient will benefit from acute OT to increase overall independence in the areas of ADLs, functional mobility, and overall safety in order to safely discharge to venue listed below.     Follow Up Recommendations  SNF;Supervision/Assistance - 24 hour (vs ALF - progress depending)    Equipment Recommendations  Other (comment) (TBD next venue of care)    Recommendations for Other Services  None at this time   Precautions / Restrictions Precautions Precautions: Fall Restrictions Weight Bearing Restrictions: No Other Position/Activity Restrictions: Patient with recent ORIF > left wrist. According to family/friends, cast removed just a few days ago. No restricitions or WB status in chart or orders.       Mobility Bed Mobility Overal bed mobility: Needs Assistance Bed Mobility: Rolling;Sidelying to Sit Rolling: Supervision Sidelying to sit: Supervision   General bed mobility comments: supervision for safety  Transfers Overall transfer level: Needs assistance Equipment used: Rolling walker (2 wheeled) Transfers: Sit to/from Stand Sit to Stand: Min guard  General transfer comment: Cues for safety and technique. Pt with poor RW safety  awareness     Balance Overall balance assessment: Needs assistance Sitting-balance support: No upper extremity supported;Feet supported Sitting balance-Leahy Scale: Good     Standing balance support: During functional activity;No upper extremity supported Standing balance-Leahy Scale: Poor Standing balance comment: Patient with good>fair standing balance with BUEs supported during functional activity    ADL Overall ADL's : Needs assistance/impaired General ADL Comments: Patient with overall poor safety awareness and requires supervision secondary to this. Patient overall min guard > min assist with ADLs and functional mobility/transfers. Patient with poor RW safety awareness and tends to push the walker out of the way instead of correctly and safely using it. According to family/friends, patient will not have 24/7 supervision/assistance post acute d/c. Recommending SNF vs ALF progress depending.     Pertinent Vitals/Pain Pain Assessment: No/denies pain     Hand Dominance Right   Extremity/Trunk Assessment Upper Extremity Assessment Upper Extremity Assessment: Generalized weakness;LUE deficits/detail LUE Deficits / Details: LUE fracture s/p ORIF, generalized weakness in wrist/hand   Lower Extremity Assessment Lower Extremity Assessment: Defer to PT evaluation   Cervical / Trunk Assessment Cervical / Trunk Assessment: Normal   Communication Communication Communication: HOH   Cognition Arousal/Alertness: Awake/alert Behavior During Therapy: WFL for tasks assessed/performed Overall Cognitive Status: History of cognitive impairments - at baseline (family/friends report cognition has been worsening over the past few weeks since she has been home from SNF)  Memory: Decreased short-term memory              Home Living Family/patient expects to be discharged to:: Other (Comment) (Pt would like to go to [facility] as she has been there ) Living Arrangements: Alone Available  Help at Discharge: Family Type of Home: House Home Access:  Stairs to enter Entergy CorporationEntrance Stairs-Number of Steps: 4 Entrance Stairs-Rails: Can reach both Home Layout: One level     Bathroom Shower/Tub: Producer, television/film/videoWalk-in shower   Bathroom Toilet: Standard     Home Equipment: Environmental consultantWalker - 2 wheels;Shower seat   Additional Comments: walk in shower with standard height toilet         OT Diagnosis: Generalized weakness;Cognitive deficits   OT Problem List: Decreased strength;Decreased range of motion;Decreased activity tolerance;Impaired balance (sitting and/or standing);Decreased coordination;Decreased safety awareness;Decreased knowledge of use of DME or AE;Decreased knowledge of precautions;Pain   OT Treatment/Interventions: Self-care/ADL training;Energy conservation;DME and/or AE instruction;Therapeutic exercise;Therapeutic activities;Cognitive remediation/compensation;Patient/family education;Balance training    OT Goals(Current goals can be found in the care plan section) Acute Rehab OT Goals Patient Stated Goal: none stated OT Goal Formulation: With patient/family Time For Goal Achievement: 08/12/14 Potential to Achieve Goals: Good ADL Goals Pt Will Perform Grooming: with supervision;standing Pt Will Perform Lower Body Bathing: with supervision;sit to/from stand Pt Will Perform Lower Body Dressing: with supervision;sit to/from stand Pt Will Transfer to Toilet: with supervision;bedside commode;ambulating Pt/caregiver will Perform Home Exercise Program: Increased ROM;Increased strength;Left upper extremity;With written HEP provided;With minimal assist  OT Frequency: Min 2X/week   Barriers to D/C: Decreased caregiver support          Co-evaluation PT/OT/SLP Co-Evaluation/Treatment: Yes Reason for Co-Treatment: For patient/therapist safety;Necessary to address cognition/behavior during functional activity   OT goals addressed during session: ADL's and self-care;Strengthening/ROM       End of Session Equipment Utilized During Treatment: Gait belt;Rolling walker  Activity Tolerance: Patient tolerated treatment well Patient left: in bed;with call bell/phone within reach;with family/visitor present;with bed alarm set   Time: 1424-1444 OT Time Calculation (min): 20 min Charges:  OT General Charges $OT Visit: 1 Procedure OT Evaluation $Initial OT Evaluation Tier I: 1 Procedure G-Codes: OT G-codes **NOT FOR INPATIENT CLASS** Functional Limitation: Self care Self Care Current Status (O9629(G8987): At least 1 percent but less than 20 percent impaired, limited or restricted Self Care Goal Status (B2841(G8988): At least 1 percent but less than 20 percent impaired, limited or restricted  Toluwani Ruder 08/05/2014, 3:05 PM

## 2014-08-06 ENCOUNTER — Observation Stay (HOSPITAL_COMMUNITY): Payer: Medicare Other

## 2014-08-06 DIAGNOSIS — R350 Frequency of micturition: Secondary | ICD-10-CM | POA: Diagnosis not present

## 2014-08-06 DIAGNOSIS — J449 Chronic obstructive pulmonary disease, unspecified: Secondary | ICD-10-CM | POA: Diagnosis not present

## 2014-08-06 DIAGNOSIS — G3184 Mild cognitive impairment, so stated: Secondary | ICD-10-CM | POA: Diagnosis not present

## 2014-08-06 DIAGNOSIS — R4189 Other symptoms and signs involving cognitive functions and awareness: Secondary | ICD-10-CM | POA: Diagnosis not present

## 2014-08-06 DIAGNOSIS — R296 Repeated falls: Secondary | ICD-10-CM | POA: Diagnosis not present

## 2014-08-06 LAB — COMPREHENSIVE METABOLIC PANEL
ALBUMIN: 3.4 g/dL — AB (ref 3.5–5.0)
ALT: 16 U/L (ref 14–54)
ANION GAP: 10 (ref 5–15)
AST: 20 U/L (ref 15–41)
Alkaline Phosphatase: 42 U/L (ref 38–126)
BILIRUBIN TOTAL: 0.9 mg/dL (ref 0.3–1.2)
BUN: 28 mg/dL — AB (ref 6–20)
CHLORIDE: 103 mmol/L (ref 101–111)
CO2: 26 mmol/L (ref 22–32)
CREATININE: 1.27 mg/dL — AB (ref 0.44–1.00)
Calcium: 8.9 mg/dL (ref 8.9–10.3)
GFR calc non Af Amer: 35 mL/min — ABNORMAL LOW (ref >60)
GFR, EST AFRICAN AMERICAN: 41 mL/min — AB (ref >60)
Glucose, Bld: 126 mg/dL — ABNORMAL HIGH (ref 70–99)
Potassium: 3.8 mmol/L (ref 3.5–5.1)
Sodium: 139 mmol/L (ref 135–145)
Total Protein: 6.9 g/dL (ref 6.5–8.1)

## 2014-08-06 LAB — URINE MICROSCOPIC-ADD ON

## 2014-08-06 LAB — CBC
HCT: 36.6 % (ref 36.0–46.0)
Hemoglobin: 12 g/dL (ref 12.0–15.0)
MCH: 30.1 pg (ref 26.0–34.0)
MCHC: 32.8 g/dL (ref 30.0–36.0)
MCV: 91.7 fL (ref 78.0–100.0)
PLATELETS: 140 10*3/uL — AB (ref 150–400)
RBC: 3.99 MIL/uL (ref 3.87–5.11)
RDW: 13.6 % (ref 11.5–15.5)
WBC: 5.3 10*3/uL (ref 4.0–10.5)

## 2014-08-06 LAB — URINALYSIS, ROUTINE W REFLEX MICROSCOPIC
BILIRUBIN URINE: NEGATIVE
Glucose, UA: NEGATIVE mg/dL
Hgb urine dipstick: NEGATIVE
Ketones, ur: NEGATIVE mg/dL
Nitrite: NEGATIVE
PH: 5.5 (ref 5.0–8.0)
Protein, ur: NEGATIVE mg/dL
Specific Gravity, Urine: 1.011 (ref 1.005–1.030)
Urobilinogen, UA: 0.2 mg/dL (ref 0.0–1.0)

## 2014-08-06 LAB — HIV ANTIBODY (ROUTINE TESTING W REFLEX): HIV Screen 4th Generation wRfx: NONREACTIVE

## 2014-08-06 LAB — RPR: RPR Ser Ql: NONREACTIVE

## 2014-08-06 MED ORDER — CEFTRIAXONE SODIUM IN DEXTROSE 20 MG/ML IV SOLN
1.0000 g | INTRAVENOUS | Status: DC
  Administered 2014-08-06: 1 g via INTRAVENOUS
  Filled 2014-08-06 (×2): qty 50

## 2014-08-06 NOTE — Progress Notes (Signed)
Physical Therapy Treatment Patient Details Name: Yvonne DrossVirginia K Buenaventura MRN: [id number] DOB: 24-Jan-1919 Today's Date: 08/06/2014    History of Present Illness 81 y.o. female with a past medical history of stroke, hypertension, recent hospitalization in March after she fell and sustained a fracture of her left distal radius, ulna, as well as left anterior eighth rib. She subsequently went to [facility] for rehabilitation. About 3 weeks ago she went back home. Patient brought to the hospital by close friend secondary to 4 falls 5/8, including some overnight.    PT Comments    Patient continues to have difficulty with balance, ambulation, and safety awareness.  Patient continues to complain of pain in L knee (MD made aware). Patient still very confabulatory during session, perseverates on left knee pain, and demonstrates no carryover of cues for safety. Will continue to see as indicated.   Follow Up Recommendations  SNF;Supervision/Assistance - 24 hour (vs assisted living pending progress)     Equipment Recommendations  None recommended by PT    Recommendations for Other Services       Precautions / Restrictions Precautions Precautions: Fall Restrictions Weight Bearing Restrictions: No    Mobility  Bed Mobility Overal bed mobility: Needs Assistance Bed Mobility: Rolling;Sidelying to Sit Rolling: Supervision Sidelying to sit: Supervision       General bed mobility comments: supervision for safety  Transfers Overall transfer level: Needs assistance Equipment used: Rolling walker (2 wheeled) Transfers: Sit to/from Stand Sit to Stand: Min guard         General transfer comment: VCs for safety and hand placement, patient required max cues for safety but demonstrates no carry over. Performed transfer x3  Ambulation/Gait Ambulation/Gait assistance: Min assist;Mod assist Ambulation Distance (Feet): 180 Feet (one seated rest break because patient felt "heavy") Assistive device:  1 person hand held assist Gait Pattern/deviations: Step-through pattern;Decreased stride length;Drifts right/left;Trunk flexed Gait velocity: decreased Gait velocity interpretation: Below normal speed for age/gender General Gait Details: instability still present with ambulation, decreased attention to task, Max cues for gait    Stairs            Wheelchair Mobility    Modified Rankin (Stroke Patients Only)       Balance   Sitting-balance support: No upper extremity supported Sitting balance-Leahy Scale: Good     Standing balance support: During functional activity Standing balance-Leahy Scale: Poor Standing balance comment: patient with fair standing balance today, LOB with distraction.                     Cognition Arousal/Alertness: Awake/alert   Overall Cognitive Status: Impaired/Different from baseline Area of Impairment: Attention;Following commands;Safety/judgement;Awareness;Problem solving   Current Attention Level: Sustained Memory: Decreased short-term memory     Awareness: Intellectual   General Comments: patient perseverating on left knee pain, stopped several times during minimal ambulation to point out that the left knee was hurting. Patient with poor memory, constantly repeating things throughout session withno carryover.    Exercises General Exercises - Lower Extremity Ankle Circles/Pumps: AROM;Both;10 reps Long Arc Quad: AROM;Both;10 reps (cues to direct to task)    General Comments General comments (skin integrity, edema, etc.): patient performed EOB functional activities with no assist for balance. [facility] during session.       Pertinent Vitals/Pain Pain Assessment: Faces Faces Pain Scale: Hurts little more Pain Location: left knee Pain Descriptors / Indicators: Discomfort;Heaviness Pain Intervention(s): Monitored during session;Repositioned    Home Living  Prior  Function            PT Goals (current goals can now be found in the care plan section) Acute Rehab PT Goals Patient Stated Goal: none stated PT Goal Formulation: With patient/family Time For Goal Achievement: 08/19/14 Potential to Achieve Goals: Good Progress towards PT goals: Progressing toward goals    Frequency  Min 3X/week    PT Plan Current plan remains appropriate    Co-evaluation             End of Session Equipment Utilized During Treatment: Gait belt Activity Tolerance: Patient tolerated treatment well;Patient limited by pain Patient left: in chair;with call bell/phone within reach;with chair alarm set     Time: 0902-0929 PT Time Calculation (min) (ACUTE ONLY): 27 min  Charges:  $Gait Training: 8-22 mins                    G CodesFabio Asa:    Willella Harding J 08/06/2014, 12:11 PM Charlotte Crumbevon Mikeila Burgen, PT DPT  [phone]

## 2014-08-06 NOTE — Progress Notes (Addendum)
[facility]                                                                              Patient Demographics  Yvonne [name], is a 81 y.o. female, DOB - 22-May-1918, ZOX:[id number]  Admit date - 08/05/2014   Admitting Physician Osvaldo Shipper, MD  Outpatient Primary MD for the patient is Gaye Alken, MD  LOS - 1   No chief complaint on file.     Interim history 81 year old female with history of stroke, hypertension, recent hospitalization in March after a fall and fracture, presented with frequent falling. Patient does live alone. There was some question of urinary frequency. Patient admitted and will likely need nursing home placement.  Assessment & Plan   Frequent Falls  -MRI showed chronic changes, no acute intracranial findings, degree of atrophy and small vessel disease compared with January -PT and OT consulted, PT recommended SNF -EEG: Normal EEG recording during wakefulness, no evidence of encephalopathic process nor lesion disorder demonstrated  Increased urinary frequency -Per patient she is not having any urinary symptoms, feels that her urine is actually clear. -UA showed WBC 11-20, many squamous epithelial cells, few bacteria, small leukocytes, negative nitrites -Will start patient on ceftriaxone -pending urine culture  History of stroke  -Continue Plavix and statin  Cognitive impairment  -MRI January revealed atrophy, patient could have vascular dementia -Repeat MRI as above -Vitamin B12: 653 -TSH 2.152 -RPR and HIV nonreactive  History of COPD -Continue home medication -Currently compensated  Hypertension/History of multifocal atria tachycardia  -Currently stable, continue Lopressor and Cardizem  Chronic kidney disease, stage III -Creatinine stable, continue to monitor BMP  Code Status: DNR  Family Communication: None at bedside  Disposition Plan: Admitted, will likely need SNF  Time Spent in minutes   30  minutes  Procedures  None  Consults   None  DVT Prophylaxis  SCDs  Lab Results  Component Value Date   PLT 140* 08/06/2014    Medications  Scheduled Meds: . atorvastatin  20 mg Oral q1800  . clopidogrel  75 mg Oral Q breakfast  . diltiazem  240 mg Oral Daily  . docusate sodium  100 mg Oral BID  . metoprolol  50 mg Oral BID  . mometasone-formoterol  2 puff Inhalation BID  . multivitamin with minerals  1 tablet Oral Daily  . sertraline  50 mg Oral q morning - 10a  . sodium chloride  3 mL Intravenous Q12H   Continuous Infusions: . sodium chloride 10 mL/hr at 08/05/14 1820   PRN Meds:.acetaminophen **OR** acetaminophen, albuterol, docusate sodium, ondansetron **OR** ondansetron (ZOFRAN) IV, polyethylene glycol  Antibiotics    Anti-infectives    None      Subjective:   [place] Yvonne [name] seen and examined today.  Patient has no complaints of morning. She denies any chest pain, shortness of breath, abdominal pain, dizziness, headache, visual changes. She denies any further urinary issues.    Objective:   Filed Vitals:   08/06/14 0139 08/06/14 0523 08/06/14 1005 08/06/14 1351  BP: 147/65 151/68 115/55 134/57  Pulse: 72 98 97 87  Temp: 98 F (36.7 C) 97.6 F (36.4 C) 97.9 F (36.6 C) 98 F (36.7  C)  TempSrc: Oral Oral Oral Oral  Resp: 18 18 18 18   Height:      Weight:      SpO2: 93% 99% 99% 92%    Wt Readings from Last 3 Encounters:  08/05/14 78.472 kg (173 lb)  06/12/14 80.5 kg (177 lb 7.5 oz)  04/04/13 78.382 kg (172 lb 12.8 oz)     Intake/Output Summary (Last 24 hours) at 08/06/14 1414 Last data filed at 08/06/14 0700  Gross per 24 hour  Intake   1000 ml  Output      0 ml  Net   1000 ml    Exam  General: Well developed, well nourished, NAD, appears stated age  HEENT: NCAT, mucous membranes moist.   Cardiovascular: S1 S2 auscultated, no rubs, murmurs or gallops. Regular rate and rhythm.  Respiratory: Clear to auscultation bilaterally with  equal chest rise  Abdomen: Soft, nontender, nondistended, + bowel sounds  Extremities: warm dry without cyanosis clubbing or edema  Neuro: AAOx3, cranial nerves grossly intact. Strength Equal and bilateral in upper/lower ext.  Skin: Without rashes exudates or nodules  Psych: Normal affect and demeanor   Data Review   Micro Results No results found for this or any previous visit (from the past 240 hour(s)).  Radiology Reports Mr Brain Wo Contrast  08/06/2014   CLINICAL DATA:  Frequent falls with memory loss. Duration unspecified.  EXAM: MRI HEAD WITHOUT CONTRAST  TECHNIQUE: Multiplanar, multiecho pulse sequences of the brain and surrounding structures were obtained without intravenous contrast.  COMPARISON:  MR brain 04/04/2013.  FINDINGS: No evidence for acute infarction, hemorrhage, mass lesion, hydrocephalus, or extra-axial fluid. Generalized atrophy. Moderately advanced chronic microvascular ischemic change periventricular and subcortical white matter. RIGHT basal ganglia lenticulostriate infarct, which was acute in January, have evolved to the chronic phase. Chronic lacunes are seen elsewhere in the brain most notably LEFT paramedian pons. No significant chronic hemorrhage. Flow voids are maintained. Mild pannus. Chronic sinus disease. BILATERAL cataract extraction.  IMPRESSION: Chronic changes as described. No acute intracranial findings.  Similar degree of atrophy and small vessel disease compared with January.   Electronically Signed   By: Davonna BellingJohn  Curnes M.D.   On: 08/06/2014 08:02    CBC  Recent Labs Lab 08/05/14 1853 08/06/14 0530  WBC 5.6 5.3  HGB 11.9* 12.0  HCT 34.6* 36.6  PLT 139* 140*  MCV 90.8 91.7  MCH 31.2 30.1  MCHC 34.4 32.8  RDW 13.4 13.6    Chemistries   Recent Labs Lab 08/05/14 1853 08/06/14 0530  NA 138 139  K 3.6 3.8  CL 102 103  CO2 26 26  GLUCOSE 181* 126*  BUN 29* 28*  CREATININE 1.50* 1.27*  CALCIUM 8.7* 8.9  MG 1.7  --   AST 22 20  ALT  16 16  ALKPHOS 42 42  BILITOT 0.6 0.9   ------------------------------------------------------------------------------------------------------------------ estimated creatinine clearance is 28.1 mL/min (by C-G formula based on Cr of 1.27). ------------------------------------------------------------------------------------------------------------------ No results for input(s): HGBA1C in the last 72 hours. ------------------------------------------------------------------------------------------------------------------ No results for input(s): CHOL, HDL, LDLCALC, TRIG, CHOLHDL, LDLDIRECT in the last 72 hours. ------------------------------------------------------------------------------------------------------------------  Recent Labs  08/05/14 1853  TSH 2.152   ------------------------------------------------------------------------------------------------------------------  Recent Labs  08/05/14 1853  VITAMINB12 653    Coagulation profile No results for input(s): INR, PROTIME in the last 168 hours.  No results for input(s): DDIMER in the last 72 hours.  Cardiac Enzymes No results for input(s): CKMB, TROPONINI, MYOGLOBIN in the last 168 hours.  Invalid  input(s): CK ------------------------------------------------------------------------------------------------------------------ Invalid input(s): POCBNP    Jan Olano D.O. on 08/06/2014 at 2:14 PM  Between 7am to 7pm - Pager - [phone]  After 7pm go to [email or web address] - password [email or web address]  And look for the night coverage person covering for me after hours  [facility] Office  [phone]

## 2014-08-07 DIAGNOSIS — G3184 Mild cognitive impairment, so stated: Secondary | ICD-10-CM | POA: Diagnosis not present

## 2014-08-07 DIAGNOSIS — R296 Repeated falls: Secondary | ICD-10-CM | POA: Diagnosis not present

## 2014-08-07 DIAGNOSIS — R4189 Other symptoms and signs involving cognitive functions and awareness: Secondary | ICD-10-CM | POA: Diagnosis not present

## 2014-08-07 LAB — BASIC METABOLIC PANEL
ANION GAP: 11 (ref 5–15)
BUN: 30 mg/dL — ABNORMAL HIGH (ref 6–20)
CALCIUM: 8.7 mg/dL — AB (ref 8.9–10.3)
CO2: 23 mmol/L (ref 22–32)
Chloride: 105 mmol/L (ref 101–111)
Creatinine, Ser: 1.2 mg/dL — ABNORMAL HIGH (ref 0.44–1.00)
GFR calc non Af Amer: 37 mL/min — ABNORMAL LOW (ref >60)
GFR, EST AFRICAN AMERICAN: 43 mL/min — AB (ref >60)
Glucose, Bld: 142 mg/dL — ABNORMAL HIGH (ref 70–99)
Potassium: 4 mmol/L (ref 3.5–5.1)
SODIUM: 139 mmol/L (ref 135–145)

## 2014-08-07 MED ORDER — ATORVASTATIN CALCIUM 20 MG PO TABS: 20.0000 mg | ORAL_TABLET | Freq: Every day | ORAL | Status: DC

## 2014-08-07 MED ORDER — CEPHALEXIN 500 MG PO CAPS: 500.0000 mg | ORAL_CAPSULE | Freq: Two times a day (BID) | ORAL | Status: DC

## 2014-08-07 MED ORDER — ONDANSETRON HCL 4 MG/2ML IJ SOLN: 4.0000 mg | Freq: Four times a day (QID) | INTRAMUSCULAR | Status: DC | PRN

## 2014-08-07 MED ORDER — METOPROLOL TARTRATE 25 MG PO TABS: 25.0000 mg | ORAL_TABLET | Freq: Two times a day (BID) | ORAL | Status: DC

## 2014-08-07 NOTE — Progress Notes (Signed)
[facility].

## 2014-08-07 NOTE — Progress Notes (Signed)
OT Cancellation Note  Patient Details Name: Yvonne DrossVirginia K [name] MRN: [id number] DOB: 14-Jun-1918   Cancelled Treatment:    Reason Eval/Treat Not Completed: Other (comment) (Pt pleasantly declined due to transferring to rehab this pm/)   Pilar GrammesMathews, Ahleah Simko H 08/07/2014, 2:22 PM

## 2014-08-07 NOTE — Progress Notes (Signed)
[facility].  Report called.  Vital signs and assessments were stable.

## 2014-08-07 NOTE — Clinical Social Work Note (Signed)
Clinical Social Work Assessment  Patient Details  Name: Yvonne [name] MRN: [id number] Date of Birth: [birth year]  Date of referral:  08/07/14               Reason for consult:  Facility Placement, Discharge Planning                Permission sought to share information with:  Family Supports, Customer service manager, Case Manager  Housing/Transportation Living arrangements for the past 2 months:  [place] of Information:  Patient, Friend/Neighbor Patient Interpreter Needed:  None Criminal Activity/Legal Involvement Pertinent to Current Situation/Hospitalization:  No - Comment as needed Significant Relationships:  Friend Lives with:  Self Do you feel safe going back to the place where you live?  Yes Need for family participation in patient care:  Yes (Comment)  Care giving concerns:  No care giving concerns at this time.   Social Worker assessment / plan:  Holiday representative met with patient and pt's friend, Alejandro Mulling several times throughout today in reference to post-acute placement for SNF. CSW introduced CSW role and SNF process. CSW also reviewed and provided SNF list with bed offers. Pt reported she has been at [facility] in the past and expressed a strong desire to return once medically stable. Pt's friend also stated she would like for pt to return to [facility] since she has been a resident twice in the past and facility is fairly close to pt's home. CSW reported [facility] not have a female opening at this time however pt can be transitioned to SNF from another SNF once a bed becomes available. Family is agreeable with plan and chooses bed at [facility] and [facility]. Pt slightly confused during assessment. No further concerns expressed by pt/ family. CSW will continue to follow pt and pt's family for continued support and to facilitate pt's discharge needs once medically stable.   Employment status:   Retired Nurse, adult PT Recommendations:  [place] / Referral to community resources:  [place]  Patient/Family's Response to care:  Pt sitting up at bedside alert and oriented X3. Pt expressed strong for placement at [facility] however chooses bed at [facility] with hopes to be transferred to [facility] once bed becomes available. Pt and pt's family agreeable to SNF placement at [facility] and plans to complete paperwork at 1:30PM with facility. Pt and pt's  Family pleasant and appreciated social work intervention.   Patient/Family's Understanding of and Emotional Response to Diagnosis, Current Treatment, and Prognosis:  Pt's friend has limited understanding of pt's medical work up and asked to meet with MD. CSW notified MD and questions in regards to diagnosis etc were resolved.   Emotional Assessment Appearance:  Appears younger than stated age Attitude/Demeanor/Rapport:   (Pleasant ) Affect (typically observed):  Accepting, Hopeful, Happy, Stable Orientation:  Oriented to Self, Oriented to Place, Oriented to Situation Alcohol / Substance use:  Never Used Psych involvement (Current and /or in the community):  No (Comment)  Discharge Needs  Concerns to be addressed:  No discharge needs identified Readmission within the last 30 days:  Yes Current discharge risk:  None Barriers to Discharge:  No Barriers Identified   Glendon Axe, MSW, LCSWA [phone] 08/07/2014 2:19 PM

## 2014-08-07 NOTE — Clinical Social Work Placement (Signed)
   CLINICAL SOCIAL WORK PLACEMENT  NOTE  Date:  08/07/2014  Patient Details  Name: Yvonne [name] MRN: [id number] Date of Birth: [birth year]  Clinical Social Work is seeking post-discharge placement for this patient at the Skilled  Nursing Facility level of care (*CSW will initial, date and re-position this form in  chart as items are completed):  Yes   Patient/family provided with [facility] Clinical Social Work Department's list of facilities offering this level of care within the geographic area requested by the patient (or if unable, by the patient's family).  Yes   Patient/family informed of their freedom to choose among providers that offer the needed level of care, that participate in Medicare, Medicaid or managed care program needed by the patient, have an available bed and are willing to accept the patient.  Yes   Patient/family informed of 's ownership interest in [facility] and [facility], as well as of the fact that they are under no obligation to receive care at these facilities.  PASRR submitted to EDS on 08/07/14     PASRR number received on       Existing PASRR number confirmed on 08/07/14     FL2 transmitted to all facilities in geographic area requested by pt/family on 08/07/14     FL2 transmitted to all facilities within larger geographic area on       Patient informed that his/her managed care company has contracts with or will negotiate with certain facilities, including the following:   (YES)     Yes   Patient/family informed of bed offers received.  Patient chooses bed at  [facility] )     Physician recommends and patient chooses bed at      Patient to be [facility] to  [facility] ) on 08/07/14.  Patient to be transferred to facility by  [facility] )     Patient family notified on 08/07/14 of transfer.  Name of family member notified:   (Pt's friend, Yvonne [name] )     PHYSICIAN       Additional  Comment:    _______________________________________________ Derenda FennelBashira Kiyona Mcnall, MSW, LCSWA [phone] 08/07/2014 2:21 PM

## 2014-08-07 NOTE — Clinical Social Work Note (Signed)
Clinical Social Worker facilitated patient discharge including contacting patient family and facility to confirm patient discharge plans.  Clinical information faxed to facility and family agreeable with plan.  CSW arranged [facility].  RN to call report prior to discharge.  DC packet prepared and on chart for transport.   Clinical Social Worker will sign off for now as social work intervention is no longer needed. Please consult us again if new need arises.  Derenda FennelBashira Myeisha Kruser, MSW, LCSWA [phone] 08/07/2014 2:21 PM

## 2014-08-07 NOTE — Discharge Summary (Signed)
Physician Discharge Summary  Sharyn DrossVirginia K Petraglia ZOX:[id number] DOB: 09/20/1918 DOA: 08/05/2014  PCP: Gaye AlkenBARNES,ELIZABETH STEWART, MD  Admit date: 08/05/2014 Discharge date: 08/07/2014  Time spent: 35 minutes  Recommendations for Outpatient Follow-up:  1. Monitor NP, adjust BP medication as needed. Avoid hypotension.  2. Need PT, OT.    Discharge Diagnoses:  Principal Problem:   Frequent falls Active Problems:   History of CVA (cerebrovascular accident)   Frequency of urination   Cognitive impairment   COPD (chronic obstructive pulmonary disease)   Discharge Condition: stable.   Diet recommendation: heart healthy  Filed Weights   08/05/14 1800  Weight: 78.472 kg (173 lb)    History of present illness:  81 year old female with history of stroke, hypertension, recent hospitalization in March after a fall and fracture, presented with frequent falling. Patient does live alone. There was some question of urinary frequency. Patient admitted and will likely need nursing home placement.  Hospital Course:  Frequent Falls  -MRI showed chronic changes, no acute intracranial findings, degree of atrophy and small vessel disease compared with January -PT and OT consulted, PT recommended SNF -EEG: Normal EEG recording during wakefulness, no evidence of encephalopathic process nor lesion disorder demonstrated -Monitor BP, metoprolol decrease to avoid hypotension.   Increased urinary frequency -Per patient she is not having any urinary symptoms, feels that her urine is actually clear. -UA showed WBC 11-20, many squamous epithelial cells, few bacteria, small leukocytes, negative nitrites -patient on ceftriaxone 2 days. Will discharge on keflex for 2 days.   History of stroke  -Continue Plavix and statin  Cognitive impairment  -MRI January revealed atrophy, patient could have vascular dementia -Repeat MRI as above -Vitamin B12: 653 -TSH 2.152 -RPR and HIV nonreactive  History of  COPD -Continue home medication -Currently compensated  Hypertension/History of multifocal atria tachycardia  -Currently stable, continue Lopressor and Cardizem -decrease metoprolol to 25 mg BID SBP was soft at some point,.   Chronic kidney disease, stage III -Creatinine stable, continue to monitor BMP  Procedures:  none  Consultations:  none  Discharge Exam: Filed Vitals:   08/07/14 0918  BP: 128/50  Pulse: 91  Temp: 98.3 F (36.8 C)  Resp: 18    General: Alert in no distress.  Cardiovascular: S 1, S 2 RRR Respiratory: CTA  Discharge Instructions   Discharge Instructions    Diet - low sodium heart healthy    Complete by:  As directed      Increase activity slowly    Complete by:  As directed           Current Discharge Medication List    START taking these medications   Details  atorvastatin (LIPITOR) 20 MG tablet Take 1 tablet (20 mg total) by mouth daily at 6 PM. Qty: 30 tablet, Refills: 0    ondansetron (ZOFRAN) 4 MG/2ML SOLN injection Inject 2 mLs (4 mg total) into the vein every 6 (six) hours as needed for nausea. Qty: 2 mL, Refills: 0      CONTINUE these medications which have CHANGED   Details  metoprolol tartrate (LOPRESSOR) 25 MG tablet Take 1 tablet (25 mg total) by mouth 2 (two) times daily. Qty: 30 tablet, Refills: 0      CONTINUE these medications which have NOT CHANGED   Details  Calcium Carbonate-Vitamin D (CALCIUM + D PO) Take 1 tablet by mouth daily.    clopidogrel (PLAVIX) 75 MG tablet Take 1 tablet (75 mg total) by mouth daily with breakfast. Qty: 30 tablet,  Refills: 1    diltiazem (CARDIZEM CD) 240 MG 24 hr capsule Take 1 capsule (240 mg total) by mouth daily. Start 04/10/13 Qty: 30 capsule, Refills: 1    Multiple Vitamin (MULTIVITAMIN WITH MINERALS) TABS tablet Take 1 tablet by mouth daily.    naproxen sodium (ANAPROX) 220 MG tablet Take 220 mg by mouth once.    sertraline (ZOLOFT) 50 MG tablet Take 50 mg by mouth daily.      VITAMIN E PO Take 1 capsule by mouth daily.    acetaminophen (TYLENOL) 325 MG tablet Take 2 tablets (650 mg total) by mouth every 6 (six) hours as needed for fever, headache, mild pain or moderate pain.    albuterol (PROVENTIL HFA;VENTOLIN HFA) 108 (90 BASE) MCG/ACT inhaler Inhale 2 puffs into the lungs every 6 (six) hours as needed for wheezing or shortness of breath. Qty: 1 Inhaler, Refills: 1    docusate sodium (COLACE) 100 MG capsule Take 1 capsule (100 mg total) by mouth 2 (two) times daily as needed for mild constipation. Qty: 10 capsule, Refills: 0    mometasone-formoterol (DULERA) 200-5 MCG/ACT AERO Inhale 2 puffs into the lungs 2 (two) times daily. Qty: 1 Inhaler, Refills: 1    polyethylene glycol (MIRALAX / GLYCOLAX) packet Take 17 g by mouth daily as needed. Qty: 14 each, Refills: 0      STOP taking these medications     simvastatin (ZOCOR) 40 MG tablet      traMADol (ULTRAM) 50 MG tablet        No Known Allergies Follow-up Information    Follow up with Gaye AlkenBARNES,ELIZABETH STEWART, MD In 1 week.   Specialty:  Family Medicine   Contact information:   [address] [place] [address] [phone]        The results of significant diagnostics from this hospitalization (including imaging, microbiology, ancillary and laboratory) are listed below for reference.    Significant Diagnostic Studies: Mr Brain Wo Contrast  08/06/2014   CLINICAL DATA:  Frequent falls with memory loss. Duration unspecified.  EXAM: MRI HEAD WITHOUT CONTRAST  TECHNIQUE: Multiplanar, multiecho pulse sequences of the brain and surrounding structures were obtained without intravenous contrast.  COMPARISON:  MR brain 04/04/2013.  FINDINGS: No evidence for acute infarction, hemorrhage, mass lesion, hydrocephalus, or extra-axial fluid. Generalized atrophy. Moderately advanced chronic microvascular ischemic change periventricular and subcortical white matter. RIGHT basal ganglia  lenticulostriate infarct, which was acute in January, have evolved to the chronic phase. Chronic lacunes are seen elsewhere in the brain most notably LEFT paramedian pons. No significant chronic hemorrhage. Flow voids are maintained. Mild pannus. Chronic sinus disease. BILATERAL cataract extraction.  IMPRESSION: Chronic changes as described. No acute intracranial findings.  Similar degree of atrophy and small vessel disease compared with January.   Electronically Signed   By: Davonna BellingJohn  Curnes M.D.   On: 08/06/2014 08:02    Microbiology: No results found for this or any previous visit (from the past 240 hour(s)).   Labs: Basic Metabolic Panel:  Recent Labs Lab 08/05/14 1853 08/06/14 0530 08/07/14 0430  NA 138 139 139  K 3.6 3.8 4.0  CL 102 103 105  CO2 26 26 23   GLUCOSE 181* 126* 142*  BUN 29* 28* 30*  CREATININE 1.50* 1.27* 1.20*  CALCIUM 8.7* 8.9 8.7*  MG 1.7  --   --    Liver Function Tests:  Recent Labs Lab 08/05/14 1853 08/06/14 0530  AST 22 20  ALT 16 16  ALKPHOS 42 42  BILITOT 0.6 0.9  PROT 6.3* 6.9  ALBUMIN 3.4* 3.4*   No results for input(s): LIPASE, AMYLASE in the last 168 hours. No results for input(s): AMMONIA in the last 168 hours. CBC:  Recent Labs Lab 08/05/14 1853 08/06/14 0530  WBC 5.6 5.3  HGB 11.9* 12.0  HCT 34.6* 36.6  MCV 90.8 91.7  PLT 139* 140*   Cardiac Enzymes: No results for input(s): CKTOTAL, CKMB, CKMBINDEX, TROPONINI in the last 168 hours. BNP: BNP (last 3 results) No results for input(s): BNP in the last 8760 hours.  ProBNP (last 3 results) No results for input(s): PROBNP in the last 8760 hours.  CBG: No results for input(s): GLUCAP in the last 168 hours.     SignedHartley Barefoot A  [facility] 08/07/2014, 11:16 AM

## 2014-08-08 ENCOUNTER — Encounter: Payer: Self-pay | Admitting: Internal Medicine

## 2014-08-08 ENCOUNTER — Non-Acute Institutional Stay (SKILLED_NURSING_FACILITY): Payer: Medicare Other | Admitting: Internal Medicine

## 2014-08-08 DIAGNOSIS — N183 Chronic kidney disease, stage 3 unspecified: Secondary | ICD-10-CM | POA: Insufficient documentation

## 2014-08-08 DIAGNOSIS — I10 Essential (primary) hypertension: Secondary | ICD-10-CM | POA: Insufficient documentation

## 2014-08-08 DIAGNOSIS — R4189 Other symptoms and signs involving cognitive functions and awareness: Secondary | ICD-10-CM | POA: Diagnosis not present

## 2014-08-08 DIAGNOSIS — J449 Chronic obstructive pulmonary disease, unspecified: Secondary | ICD-10-CM

## 2014-08-08 DIAGNOSIS — R296 Repeated falls: Secondary | ICD-10-CM | POA: Diagnosis not present

## 2014-08-08 DIAGNOSIS — R350 Frequency of micturition: Secondary | ICD-10-CM

## 2014-08-08 DIAGNOSIS — I639 Cerebral infarction, unspecified: Secondary | ICD-10-CM | POA: Diagnosis not present

## 2014-08-08 NOTE — Assessment & Plan Note (Signed)
-  Per patient she is not having any urinary symptoms, feels that her urine is actually clear. -UA showed WBC 11-20, many squamous epithelial cells, few bacteria, small leukocytes, negative nitrites -patient on ceftriaxone 2 days. Will discharge on keflex for 2 days.

## 2014-08-08 NOTE — Assessment & Plan Note (Signed)
-  MRI January revealed atrophy, patient could have vascular dementia -Repeat MRI as above -Vitamin B12: 653 -TSH 2.152 -RPR and HIV nonreactive

## 2014-08-08 NOTE — Assessment & Plan Note (Signed)
Cr stable 

## 2014-08-08 NOTE — Assessment & Plan Note (Signed)
Stable on plax=vix and statin

## 2014-08-08 NOTE — Assessment & Plan Note (Signed)
-  MRI showed chronic changes, no acute intracranial findings, degree of atrophy and small vessel disease compared with January -PT and OT consulted, PT recommended SNF -EEG: Normal EEG recording during wakefulness, no evidence of encephalopathic process nor lesion disorder demonstrated -Monitor BP, metoprolol decrease to avoid hypotension.

## 2014-08-08 NOTE — Progress Notes (Signed)
MRN: [id number] Name: Yvonne [name]: female Age: 81 y.o. DOB: 07/15/1918  [facility] Facility/Room:311 Level Of Care: SNF Provider: Merrilee SeashoreALEXANDER, Jonan Seufert D Emergency Contacts: Extended Emergency Contact Information Primary Emergency Contact: [name],[name]          [place] [id number] [place] Home Phone: [phone] Relation: Friend Secondary Emergency Contact: [name],[name]  [place] Home Phone: [phone] Mobile Phone: [phone] Relation: Friend  Code Status: DNR  Allergies: Review of patient's allergies indicates no known allergies.  Chief Complaint  Patient presents with  . New Admit To SNF    HPI: Patient is 81 y.o. female who is admitted to SNF for generalized weakness for OT/PT after being hospitalized for frequent falls. She may eventually be a resident.  Past Medical History  Diagnosis Date  . Hypertension   . Hypercholesteremia   . ARF (acute renal failure)   . TIA (transient ischemic attack)     Past Surgical History  Procedure Laterality Date  . Back surgery    . Orif wrist fracture Left 06/12/2014  . Orif wrist fracture Left 06/12/2014    Procedure: OPEN REDUCTION INTERNAL FIXATION (ORIF) WRIST FRACTURE;  Surgeon: Bradly BienenstockFred Ortmann, MD;  Location: [facility] OR;  Service: Orthopedics;  Laterality: Left;      Medication List       This list is accurate as of: 08/08/14 11:59 PM.  Always use your most recent med list.               acetaminophen 325 MG tablet  Commonly known as:  TYLENOL  Take 2 tablets (650 mg total) by mouth every 6 (six) hours as needed for fever, headache, mild pain or moderate pain.     albuterol 108 (90 BASE) MCG/ACT inhaler  Commonly known as:  PROVENTIL HFA;VENTOLIN HFA  Inhale 2 puffs into the lungs every 6 (six) hours as needed for wheezing or shortness of breath.     atorvastatin 20 MG tablet  Commonly known as:  LIPITOR  Take 1 tablet (20 mg total) by mouth daily at 6 PM.     CALCIUM + D PO  Take 1 tablet by mouth daily.     cephALEXin 500 MG capsule  Commonly known as:  KEFLEX  Take 1 capsule (500 mg total) by mouth every 12 (twelve) hours.     clopidogrel 75 MG tablet  Commonly known as:  PLAVIX  Take 1 tablet (75 mg total) by mouth daily with breakfast.     diltiazem 240 MG 24 hr capsule  Commonly known as:  CARDIZEM CD  Take 1 capsule (240 mg total) by mouth daily. Start 04/10/13     docusate sodium 100 MG capsule  Commonly known as:  COLACE  Take 1 capsule (100 mg total) by mouth 2 (two) times daily as needed for mild constipation.     metoprolol tartrate 25 MG tablet  Commonly known as:  LOPRESSOR  Take 1 tablet (25 mg total) by mouth 2 (two) times daily.     mometasone-formoterol 200-5 MCG/ACT Aero  Commonly known as:  DULERA  Inhale 2 puffs into the lungs 2 (two) times daily.     multivitamin with minerals Tabs tablet  Take 1 tablet by mouth daily.     naproxen sodium 220 MG tablet  Commonly known as:  ANAPROX  Take 220 mg by mouth once.     ondansetron 4 MG/2ML Soln injection  Commonly known as:  ZOFRAN  Inject 2 mLs (4 mg  total) into the vein every 6 (six) hours as needed for nausea.     polyethylene glycol packet  Commonly known as:  MIRALAX / GLYCOLAX  Take 17 g by mouth daily as needed.     sertraline 50 MG tablet  Commonly known as:  ZOLOFT  Take 50 mg by mouth daily.     VITAMIN E PO  Take 1 capsule by mouth daily.        No orders of the defined types were placed in this encounter.     There is no immunization history on file for this patient.  History  Substance Use Topics  . Smoking status: Former Smoker    Quit date: 04/05/1983  . Smokeless tobacco: Never Used  . Alcohol Use: No    Family history is noncontributory    Review of Systems  DATA OBTAINED: from patient, nurse GENERAL:  no fevers, fatigue, appetite changes SKIN: No itching, rash or wounds EYES: No eye pain, redness, discharge EARS: No  earache, tinnitus, change in hearing NOSE: No congestion, drainage or bleeding  MOUTH/THROAT: No mouth or tooth pain, No sore throat RESPIRATORY: No cough, wheezing, SOB CARDIAC: No chest pain, palpitations, lower extremity edema  GI: No abdominal pain, No N/V/D or constipation, No heartburn or reflux  GU: No dysuria, frequency or urgency, or incontinence  MUSCULOSKELETAL: No unrelieved bone/joint pain NEUROLOGIC: No headache, dizziness  PSYCHIATRIC: No overt anxiety or sadness, No behavior issue.   Filed Vitals:   08/08/14 1334  BP: 194/94  Pulse: 76  Temp: 98.9 F (37.2 C)  Resp: 18    Physical Exam  GENERAL APPEARANCE: Alert, conversant, elderly WF,  No acute distress.  SKIN: No diaphoresis rash HEAD: Normocephalic, atraumatic  EYES: Conjunctiva/lids clear. Pupils round, reactive. EOMs intact.  EARS: External exam WNL, canals clear. Hearing grossly normal.  NOSE: No deformity or discharge.  MOUTH/THROAT: Lips w/o lesions  RESPIRATORY: Breathing is even, unlabored. Lung sounds are clear   CARDIOVASCULAR: Heart RRR no murmurs, rubs or gallops. No peripheral edema.   GASTROINTESTINAL: Abdomen is soft, non-tender, not distended w/ normal bowel sounds. GENITOURINARY: Bladder non tender, not distended  MUSCULOSKELETAL: No abnormal joints or musculature NEUROLOGIC:  Cranial nerves 2-12 grossly intact. Moves all extremities  PSYCHIATRIC: pt is much more confused than she appears at first glance, no behavioral issues  Patient Active Problem List   Diagnosis Date Noted  . HTN (hypertension) 08/08/2014  . CKD (chronic kidney disease) stage 3, GFR 30-59 ml/min 08/08/2014  . History of CVA (cerebrovascular accident) 08/05/2014  . Frequent falls 08/05/2014  . Frequency of urination 08/05/2014  . Cognitive impairment 08/05/2014  . COPD (chronic obstructive pulmonary disease) 08/05/2014  . Distal radius fracture, left 06/12/2014  . Wrist fracture, closed 06/12/2014  . Other  specified cardiac dysrhythmias(427.89) 04/08/2013  . Hypomagnesemia 04/07/2013  . Abnormal urinalysis 04/05/2013  . Dehydration 04/05/2013  . Acute renal failure 04/05/2013  . Orthostatic hypotension 04/05/2013  . Hypokalemia 04/05/2013  . Acute CVA (cerebrovascular accident)-nonhemorrhagic 04/04/2013    CBC    Component Value Date/Time   WBC 5.3 08/06/2014 0530   RBC 3.99 08/06/2014 0530   HGB 12.0 08/06/2014 0530   HCT 36.6 08/06/2014 0530   PLT 140* 08/06/2014 0530   MCV 91.7 08/06/2014 0530   LYMPHSABS 0.9 06/12/2014 1542   MONOABS 0.6 06/12/2014 1542   EOSABS 0.1 06/12/2014 1542   BASOSABS 0.0 06/12/2014 1542    CMP     Component Value Date/Time   NA  139 08/07/2014 0430   K 4.0 08/07/2014 0430   CL 105 08/07/2014 0430   CO2 23 08/07/2014 0430   GLUCOSE 142* 08/07/2014 0430   BUN 30* 08/07/2014 0430   CREATININE 1.20* 08/07/2014 0430   CALCIUM 8.7* 08/07/2014 0430   PROT 6.9 08/06/2014 0530   ALBUMIN 3.4* 08/06/2014 0530   AST 20 08/06/2014 0530   ALT 16 08/06/2014 0530   ALKPHOS 42 08/06/2014 0530   BILITOT 0.9 08/06/2014 0530   GFRNONAA 37* 08/07/2014 0430   GFRAA 43* 08/07/2014 0430    Assessment and Plan  Frequent falls -MRI showed chronic changes, no acute intracranial findings, degree of atrophy and small vessel disease compared with January -PT and OT consulted, PT recommended SNF -EEG: Normal EEG recording during wakefulness, no evidence of encephalopathic process nor lesion disorder demonstrated -Monitor BP, metoprolol decrease to avoid hypotension.     Frequency of urination -Per patient she is not having any urinary symptoms, feels that her urine is actually clear. -UA showed WBC 11-20, many squamous epithelial cells, few bacteria, small leukocytes, negative nitrites -patient on ceftriaxone 2 days. Will discharge on keflex for 2 days.     Cognitive impairment -MRI January revealed atrophy, patient could have vascular dementia -Repeat MRI  as above -Vitamin B12: 653 -TSH 2.152 -RPR and HIV nonreactive   COPD (chronic obstructive pulmonary disease) Baseline   Acute CVA (cerebrovascular accident)-nonhemorrhagic Stable on plax=vix and statin   HTN (hypertension) Currently stable, continue Lopressor and Cardizem -decrease metoprolol to 25 mg BID SBP was soft at some point,.    CKD (chronic kidney disease) stage 3, GFR 30-59 ml/min Cr stable     Margit Hanks, MD

## 2014-08-08 NOTE — Assessment & Plan Note (Signed)
Currently stable, continue Lopressor and Cardizem -decrease metoprolol to 25 mg BID SBP was soft at some point,.

## 2014-08-08 NOTE — Assessment & Plan Note (Signed)
Baseline

## 2014-08-11 ENCOUNTER — Encounter: Payer: Self-pay | Admitting: Internal Medicine

## 2014-08-16 ENCOUNTER — Non-Acute Institutional Stay (SKILLED_NURSING_FACILITY): Payer: Medicare Other | Admitting: Nurse Practitioner

## 2014-08-16 DIAGNOSIS — R296 Repeated falls: Secondary | ICD-10-CM

## 2014-08-16 DIAGNOSIS — I10 Essential (primary) hypertension: Secondary | ICD-10-CM

## 2014-08-16 DIAGNOSIS — N183 Chronic kidney disease, stage 3 unspecified: Secondary | ICD-10-CM

## 2014-08-16 DIAGNOSIS — K5900 Constipation, unspecified: Secondary | ICD-10-CM | POA: Diagnosis not present

## 2014-08-16 DIAGNOSIS — I639 Cerebral infarction, unspecified: Secondary | ICD-10-CM | POA: Diagnosis not present

## 2014-08-16 DIAGNOSIS — J449 Chronic obstructive pulmonary disease, unspecified: Secondary | ICD-10-CM

## 2014-08-16 DIAGNOSIS — R4189 Other symptoms and signs involving cognitive functions and awareness: Secondary | ICD-10-CM | POA: Diagnosis not present

## 2014-08-16 NOTE — Progress Notes (Signed)
Patient ID: Yvonne [name], female   DOB: Aug 11, 1918, 81 y.o.   MRN: [id number]    Nursing Home Location:  [facility]   Place of Service: SNF (31)  PCP: Gaye AlkenBARNES,ELIZABETH STEWART, MD  No Known Allergies  Chief Complaint  Patient presents with  . Discharge Note    HPI:  Patient is a 81 y.o. female [facility] at [facility] for discharge to assisted living. Pt with history of stroke, hypertension, recent hospitalization in March after a fall and fracture, who went to the hospital with frequent falling. Patient does live alone and was [facility] to [facility] term [facility] and additional rehab. Pt now ready to move to assisted living full time.  Review of Systems:  Review of Systems  Constitutional: Negative for activity change, appetite change, fatigue and unexpected weight change.  HENT: Negative for congestion and hearing loss.   Eyes: Negative.   Respiratory: Negative for cough and shortness of breath.   Cardiovascular: Negative for chest pain, palpitations and leg swelling.  Gastrointestinal: Negative for abdominal pain, diarrhea and constipation.  Genitourinary: Negative for dysuria and difficulty urinating.  Musculoskeletal: Negative for myalgias and arthralgias.  Skin: Negative for color change and wound.  Neurological: Negative for dizziness and weakness.  Psychiatric/Behavioral: Negative for agitation.    Past Medical History  Diagnosis Date  . Hypertension   . Hypercholesteremia   . ARF (acute renal failure)   . TIA (transient ischemic attack)    Past Surgical History  Procedure Laterality Date  . Back surgery    . Orif wrist fracture Left 06/12/2014  . Orif wrist fracture Left 06/12/2014    Procedure: OPEN REDUCTION INTERNAL FIXATION (ORIF) WRIST FRACTURE;  Surgeon: Bradly BienenstockFred Ortmann, MD;  Location: [facility] OR;  Service: Orthopedics;  Laterality: Left;   Social History:   reports that she quit smoking about 31 years ago. She  has never used smokeless tobacco. She reports that she does not drink alcohol or use illicit drugs.  Family History  Problem Relation Age of Onset  . Hypertension Mother   . Hypertension Father     Medications: Patient's Medications  New Prescriptions   No medications on file  Previous Medications   ACETAMINOPHEN (TYLENOL) 325 MG TABLET    Take 2 tablets (650 mg total) by mouth every 6 (six) hours as needed for fever, headache, mild pain or moderate pain.   ALBUTEROL (PROVENTIL HFA;VENTOLIN HFA) 108 (90 BASE) MCG/ACT INHALER    Inhale 2 puffs into the lungs every 6 (six) hours as needed for wheezing or shortness of breath.   ATORVASTATIN (LIPITOR) 20 MG TABLET    Take 1 tablet (20 mg total) by mouth daily at 6 PM.   CALCIUM CARBONATE-VITAMIN D (CALCIUM + D PO)    Take 1 tablet by mouth daily.   CLOPIDOGREL (PLAVIX) 75 MG TABLET    Take 1 tablet (75 mg total) by mouth daily with breakfast.   DILTIAZEM (CARDIZEM CD) 240 MG 24 HR CAPSULE    Take 1 capsule (240 mg total) by mouth daily. Start 04/10/13   DOCUSATE SODIUM (COLACE) 100 MG CAPSULE    Take 1 capsule (100 mg total) by mouth 2 (two) times daily as needed for mild constipation.   METOPROLOL TARTRATE (LOPRESSOR) 25 MG TABLET    Take 1 tablet (25 mg total) by mouth 2 (two) times daily.   MOMETASONE-FORMOTEROL (DULERA) 200-5 MCG/ACT AERO    Inhale 2 puffs into the lungs 2 (two)  times daily.   MULTIPLE VITAMIN (MULTIVITAMIN WITH MINERALS) TABS TABLET    Take 1 tablet by mouth daily.   ONDANSETRON (ZOFRAN) 4 MG/2ML SOLN INJECTION    Inject 2 mLs (4 mg total) into the vein every 6 (six) hours as needed for nausea.   POLYETHYLENE GLYCOL (MIRALAX / GLYCOLAX) PACKET    Take 17 g by mouth daily as needed.   SERTRALINE (ZOLOFT) 50 MG TABLET    Take 50 mg by mouth daily.    VITAMIN E PO    Take 1 capsule by mouth daily.  Modified Medications   No medications on file  Discontinued Medications   CEPHALEXIN (KEFLEX) 500 MG CAPSULE    Take 1 capsule  (500 mg total) by mouth every 12 (twelve) hours.   NAPROXEN SODIUM (ANAPROX) 220 MG TABLET    Take 220 mg by mouth once.     Physical Exam: Filed Vitals:   08/16/14 1316  BP: 151/81  Pulse: 70  Temp: 98.5 F (36.9 C)  Resp: 20    Physical Exam  Constitutional: She is oriented to person, place, and time. She appears well-developed and well-nourished. No distress.  HENT:  Head: Normocephalic and atraumatic.  Mouth/Throat: Oropharynx is clear and moist. No oropharyngeal exudate.  Eyes: Conjunctivae are normal. Pupils are equal, round, and reactive to light.  Neck: Normal range of motion. Neck supple.  Cardiovascular: Normal rate, regular rhythm and normal heart sounds.   Pulmonary/Chest: Effort normal and breath sounds normal.  Abdominal: Soft. Bowel sounds are normal.  Musculoskeletal: She exhibits no edema or tenderness.  Neurological: She is alert and oriented to person, place, and time.  Skin: Skin is warm and dry. She is not diaphoretic.  Psychiatric: She has a normal mood and affect.    Labs reviewed: Basic Metabolic Panel:  Recent Labs  45/40/9803/12/13 1853 08/06/14 0530 08/07/14 0430  NA 138 139 139  K 3.6 3.8 4.0  CL 102 103 105  CO2 26 26 23   GLUCOSE 181* 126* 142*  BUN 29* 28* 30*  CREATININE 1.50* 1.27* 1.20*  CALCIUM 8.7* 8.9 8.7*  MG 1.7  --   --    Liver Function Tests:  Recent Labs  08/05/14 1853 08/06/14 0530  AST 22 20  ALT 16 16  ALKPHOS 42 42  BILITOT 0.6 0.9  PROT 6.3* 6.9  ALBUMIN 3.4* 3.4*   No results for input(s): LIPASE, AMYLASE in the last 8760 hours. No results for input(s): AMMONIA in the last 8760 hours. CBC:  Recent Labs  06/12/14 1542  06/13/14 0642 08/05/14 1853 08/06/14 0530  WBC 7.8  --  7.4 5.6 5.3  NEUTROABS 6.1  --   --   --   --   HGB 12.6  < > 11.2* 11.9* 12.0  HCT 37.5  < > 34.3* 34.6* 36.6  MCV 93.1  --  93.5 90.8 91.7  PLT 130*  --  129* 139* 140*  < > = values in this interval not  displayed. TSH:  Recent Labs  08/05/14 1853  TSH 2.152   A1C: Lab Results  Component Value Date   HGBA1C 5.9* 04/04/2013   Lipid Panel: No results for input(s): CHOL, HDL, LDLCALC, TRIG, CHOLHDL, LDLDIRECT in the last 8760 hours.  Assessment/Plan 1. Cognitive impairment Most likely vascular dementia based on records and age, will be moving to assisted living   2. Essential hypertension Blood pressure medication adjusted in hospital to avoid hypotension. Stable at this time.   3. CKD (  chronic kidney disease) stage 3, GFR 30-59 ml/min Has been stable, will need out pt follow up  4. Chronic obstructive pulmonary disease, unspecified COPD, unspecified chronic bronchitis type No worsening shortness of breath or congestion.   5. Frequent falls Pt admitted to hospital due to fall and sent to [facility] for ongoing rehab, due to freq falls pt transitioning to [place]   6. Acute CVA (cerebrovascular accident)-nonhemorrhagic Hx of, remains stable on plavix and statin   7. Constipation, unspecified constipation type -well controlled while at [facility]  pt is stable for discharge, pt moving into [facility]-will need PT/OT per home health. No DME needed. will need to follow up with PCP within 2 weeks.

## 2014-08-28 ENCOUNTER — Encounter (HOSPITAL_COMMUNITY): Payer: Self-pay

## 2014-08-28 ENCOUNTER — Inpatient Hospital Stay (HOSPITAL_COMMUNITY): Payer: Medicare Other

## 2014-08-28 ENCOUNTER — Emergency Department (HOSPITAL_COMMUNITY): Payer: Medicare Other

## 2014-08-28 ENCOUNTER — Observation Stay (HOSPITAL_COMMUNITY)
Admission: EM | Admit: 2014-08-28 | Discharge: 2014-08-30 | Disposition: A | Discharge status: Skilled Nursing Facility | Payer: Medicare Other | Attending: Internal Medicine | Admitting: Internal Medicine

## 2014-08-28 DIAGNOSIS — I509 Heart failure, unspecified: Secondary | ICD-10-CM

## 2014-08-28 DIAGNOSIS — R0602 Shortness of breath: Secondary | ICD-10-CM

## 2014-08-28 DIAGNOSIS — J449 Chronic obstructive pulmonary disease, unspecified: Secondary | ICD-10-CM | POA: Insufficient documentation

## 2014-08-28 DIAGNOSIS — N183 Chronic kidney disease, stage 3 unspecified: Secondary | ICD-10-CM | POA: Diagnosis present

## 2014-08-28 DIAGNOSIS — I5033 Acute on chronic diastolic (congestive) heart failure: Secondary | ICD-10-CM | POA: Diagnosis not present

## 2014-08-28 DIAGNOSIS — Z87891 Personal history of nicotine dependence: Secondary | ICD-10-CM | POA: Diagnosis not present

## 2014-08-28 DIAGNOSIS — Z8673 Personal history of transient ischemic attack (TIA), and cerebral infarction without residual deficits: Secondary | ICD-10-CM | POA: Diagnosis not present

## 2014-08-28 DIAGNOSIS — I129 Hypertensive chronic kidney disease with stage 1 through stage 4 chronic kidney disease, or unspecified chronic kidney disease: Secondary | ICD-10-CM | POA: Insufficient documentation

## 2014-08-28 DIAGNOSIS — R0600 Dyspnea, unspecified: Principal | ICD-10-CM | POA: Insufficient documentation

## 2014-08-28 DIAGNOSIS — D649 Anemia, unspecified: Secondary | ICD-10-CM | POA: Diagnosis not present

## 2014-08-28 DIAGNOSIS — I10 Essential (primary) hypertension: Secondary | ICD-10-CM

## 2014-08-28 DIAGNOSIS — E780 Pure hypercholesterolemia: Secondary | ICD-10-CM | POA: Insufficient documentation

## 2014-08-28 LAB — COMPREHENSIVE METABOLIC PANEL
ALK PHOS: 58 U/L (ref 38–126)
ALT: 16 U/L (ref 14–54)
ANION GAP: 9 (ref 5–15)
AST: 20 U/L (ref 15–41)
Albumin: 3.9 g/dL (ref 3.5–5.0)
BILIRUBIN TOTAL: 1.2 mg/dL (ref 0.3–1.2)
BUN: 26 mg/dL — ABNORMAL HIGH (ref 6–20)
CALCIUM: 9 mg/dL (ref 8.9–10.3)
CHLORIDE: 99 mmol/L — AB (ref 101–111)
CO2: 28 mmol/L (ref 22–32)
CREATININE: 1.15 mg/dL — AB (ref 0.44–1.00)
GFR calc Af Amer: 46 mL/min — ABNORMAL LOW (ref >60)
GFR calc non Af Amer: 39 mL/min — ABNORMAL LOW (ref >60)
Glucose, Bld: 121 mg/dL — ABNORMAL HIGH (ref 65–99)
Potassium: 4 mmol/L (ref 3.5–5.1)
Sodium: 136 mmol/L (ref 135–145)
TOTAL PROTEIN: 7.4 g/dL (ref 6.5–8.1)

## 2014-08-28 LAB — CBC
HCT: 35.6 % — ABNORMAL LOW (ref 36.0–46.0)
HEMATOCRIT: 34.7 % — AB (ref 36.0–46.0)
HEMOGLOBIN: 11.7 g/dL — AB (ref 12.0–15.0)
Hemoglobin: 11.5 g/dL — ABNORMAL LOW (ref 12.0–15.0)
MCH: 30.6 pg (ref 26.0–34.0)
MCH: 30.9 pg (ref 26.0–34.0)
MCHC: 33.1 g/dL (ref 30.0–36.0)
MCHC: 32.9 g/dL (ref 30.0–36.0)
MCV: 92.3 fL (ref 78.0–100.0)
MCV: 93.9 fL (ref 78.0–100.0)
Platelets: 149 10*3/uL — ABNORMAL LOW (ref 150–400)
Platelets: 171 10*3/uL (ref 150–400)
RBC: 3.76 MIL/uL — ABNORMAL LOW (ref 3.87–5.11)
RBC: 3.79 MIL/uL — AB (ref 3.87–5.11)
RDW: 13.8 % (ref 11.5–15.5)
RDW: 13.8 % (ref 11.5–15.5)
WBC: 6.3 10*3/uL (ref 4.0–10.5)
WBC: 6.9 10*3/uL (ref 4.0–10.5)

## 2014-08-28 LAB — URINALYSIS, ROUTINE W REFLEX MICROSCOPIC
BILIRUBIN URINE: NEGATIVE
GLUCOSE, UA: NEGATIVE mg/dL
HGB URINE DIPSTICK: NEGATIVE
KETONES UR: NEGATIVE mg/dL
LEUKOCYTES UA: NEGATIVE
Nitrite: NEGATIVE
PROTEIN: NEGATIVE mg/dL
Specific Gravity, Urine: 1.005 (ref 1.005–1.030)
Urobilinogen, UA: 0.2 mg/dL (ref 0.0–1.0)
pH: 7 (ref 5.0–8.0)

## 2014-08-28 LAB — BRAIN NATRIURETIC PEPTIDE: B NATRIURETIC PEPTIDE 5: 734.9 pg/mL — AB (ref 0.0–100.0)

## 2014-08-28 LAB — TROPONIN I: Troponin I: <0.03 ng/mL (ref <0.031)

## 2014-08-28 LAB — CREATININE, SERUM
CREATININE: 0.99 mg/dL (ref 0.44–1.00)
GFR calc Af Amer: 55 mL/min — ABNORMAL LOW (ref >60)
GFR calc non Af Amer: 47 mL/min — ABNORMAL LOW (ref >60)

## 2014-08-28 LAB — TSH: TSH: 3.685 u[IU]/mL (ref 0.350–4.500)

## 2014-08-28 MED ORDER — SERTRALINE HCL 50 MG PO TABS
50.0000 mg | ORAL_TABLET | Freq: Every day | ORAL | Status: DC
  Administered 2014-08-29 – 2014-08-30 (×2): 50 mg via ORAL
  Filled 2014-08-28 (×2): qty 1

## 2014-08-28 MED ORDER — ENOXAPARIN SODIUM 30 MG/0.3ML ~~LOC~~ SOLN
30.0000 mg | SUBCUTANEOUS | Status: DC
  Administered 2014-08-28 – 2014-08-29 (×2): 30 mg via SUBCUTANEOUS
  Filled 2014-08-28 (×2): qty 0.3

## 2014-08-28 MED ORDER — SODIUM CHLORIDE 0.9 % IJ SOLN: 3.0000 mL | INTRAMUSCULAR | Status: DC | PRN

## 2014-08-28 MED ORDER — ALBUTEROL SULFATE (2.5 MG/3ML) 0.083% IN NEBU: 3.0000 mL | INHALATION_SOLUTION | Freq: Four times a day (QID) | RESPIRATORY_TRACT | Status: DC | PRN

## 2014-08-28 MED ORDER — FUROSEMIDE 10 MG/ML IJ SOLN
20.0000 mg | Freq: Once | INTRAMUSCULAR | Status: AC
  Administered 2014-08-28: 20 mg via INTRAVENOUS
  Filled 2014-08-28: qty 4

## 2014-08-28 MED ORDER — DOCUSATE SODIUM 100 MG PO CAPS: 100.0000 mg | ORAL_CAPSULE | Freq: Two times a day (BID) | ORAL | Status: DC | PRN

## 2014-08-28 MED ORDER — MAGNESIUM HYDROXIDE 400 MG/5ML PO SUSP
30.0000 mL | Freq: Every day | ORAL | Status: DC | PRN
  Administered 2014-08-29: 30 mL via ORAL
  Filled 2014-08-28: qty 30

## 2014-08-28 MED ORDER — ACETAMINOPHEN 325 MG PO TABS: 650.0000 mg | ORAL_TABLET | Freq: Four times a day (QID) | ORAL | Status: DC | PRN

## 2014-08-28 MED ORDER — ADULT MULTIVITAMIN W/MINERALS CH
1.0000 | ORAL_TABLET | Freq: Every day | ORAL | Status: DC
  Administered 2014-08-29 – 2014-08-30 (×2): 1 via ORAL
  Filled 2014-08-28 (×2): qty 1

## 2014-08-28 MED ORDER — CLOPIDOGREL BISULFATE 75 MG PO TABS
75.0000 mg | ORAL_TABLET | Freq: Every day | ORAL | Status: DC
  Administered 2014-08-29 – 2014-08-30 (×2): 75 mg via ORAL
  Filled 2014-08-28 (×2): qty 1

## 2014-08-28 MED ORDER — FUROSEMIDE 10 MG/ML IJ SOLN
40.0000 mg | Freq: Every day | INTRAMUSCULAR | Status: DC
  Administered 2014-08-29: 40 mg via INTRAVENOUS
  Filled 2014-08-28: qty 4

## 2014-08-28 MED ORDER — SODIUM CHLORIDE 0.9 % IJ SOLN
3.0000 mL | Freq: Two times a day (BID) | INTRAMUSCULAR | Status: DC
  Administered 2014-08-28 – 2014-08-29 (×3): 3 mL via INTRAVENOUS

## 2014-08-28 MED ORDER — MOMETASONE FURO-FORMOTEROL FUM 200-5 MCG/ACT IN AERO
2.0000 | INHALATION_SPRAY | Freq: Two times a day (BID) | RESPIRATORY_TRACT | Status: DC
  Administered 2014-08-28 – 2014-08-30 (×4): 2 via RESPIRATORY_TRACT
  Filled 2014-08-28: qty 8.8

## 2014-08-28 MED ORDER — SODIUM CHLORIDE 0.9 % IV SOLN: 250.0000 mL | INTRAVENOUS | Status: DC | PRN

## 2014-08-28 MED ORDER — METOPROLOL TARTRATE 25 MG PO TABS
25.0000 mg | ORAL_TABLET | Freq: Two times a day (BID) | ORAL | Status: DC
  Administered 2014-08-28 – 2014-08-30 (×5): 25 mg via ORAL
  Filled 2014-08-28 (×5): qty 1

## 2014-08-28 MED ORDER — POLYETHYLENE GLYCOL 3350 17 G PO PACK: 17.0000 g | PACK | Freq: Every day | ORAL | Status: DC | PRN

## 2014-08-28 MED ORDER — ONDANSETRON HCL 4 MG/2ML IJ SOLN: 4.0000 mg | Freq: Four times a day (QID) | INTRAMUSCULAR | Status: DC | PRN

## 2014-08-28 MED ORDER — ACETAMINOPHEN 325 MG PO TABS: 650.0000 mg | ORAL_TABLET | ORAL | Status: DC | PRN

## 2014-08-28 MED ORDER — ATORVASTATIN CALCIUM 20 MG PO TABS
20.0000 mg | ORAL_TABLET | Freq: Every day | ORAL | Status: DC
  Administered 2014-08-28: 20 mg via ORAL
  Filled 2014-08-28 (×3): qty 1

## 2014-08-28 NOTE — ED Notes (Signed)
Bed: RESB Expected date:  Expected time:  Means of arrival:  Comments: ems 

## 2014-08-28 NOTE — ED Notes (Signed)
Patient transported to X-ray 

## 2014-08-28 NOTE — Care Management Note (Signed)
Case Management Note  Patient Details  Name: Yvonne DrossVirginia K [name] MRN: [id number] Date of Birth: 06/17/1918  Subjective/Objective:  81 y/o f admitted w/sob.From [facility]. PT cons placed.                  Action/Plan:d/c plan return SNF.   Expected Discharge Date:   (UNKNOWN)               Expected Discharge Plan:  [facility]  In-House Referral:  Clinical Social Work  Discharge planning Services  CM Consult  Post Acute Care Choice:    Choice offered to:     DME Arranged:    DME Agency:     HH Arranged:    HH Agency:     Status of Service:  In process, will continue to follow  Medicare Important Message Given:    Date Medicare IM Given:    Medicare IM give by:    Date Additional Medicare IM Given:    Additional Medicare Important Message give by:     If discussed at Long Length of Stay Meetings, dates discussed:    Additional Comments:  Lanier ClamMahabir, Eliakim Tendler, RN 08/28/2014, 4:20 PM

## 2014-08-28 NOTE — ED Notes (Signed)
MD (Admitting) at bedside.

## 2014-08-28 NOTE — H&P (Signed)
History and Physical        Hospital Admission Note Date: 08/28/2014  Patient name: [name] Medical record number: [id number] Date of birth: [birth year] Age: 81 y.o. Gender: female  PCP: Gaye Alken, MD  Referring physician: Dr Leo Rod   Chief Complaint:  Shortness of breath worsening in the last 3 days  HPI: Patient is a 81 year old female with hypertension, CVA, cognitive deficits, falls, currently from assisted living facility presented to ED with worsening shortness of breath. Patient reports that she has chronic intermittent shortness of breath however in the last 3-4 days, she has noticed worsening of the shortness of breath, worse at night and laying down. She denied any significant chest pain, did mentioned mild vague chest tightness tonight, mild ankle edema. She denied any URI symptoms, fevers, chills, coughing or any wheezing. She also reports frequent urination at night recently. EDP note and her conditions reviewed, ED workup showed BNP of 734, troponin less than 0.03, CBC shows hemoglobin of 11.5, creatinine 1.1. Chest x-ray showed COPD with cardiomegaly and vascular congestion, interstitial edema. Hospitalist service admission requested for dyspnea and possible acute CHF  Review of Systems:  Constitutional: Denies fever, chills, diaphoresis, poor appetite and fatigue.  HEENT: Denies photophobia, eye pain, redness, hearing loss, ear pain, congestion, sore throat, rhinorrhea, sneezing, mouth sores, trouble swallowing, neck pain, neck stiffness and tinnitus.   Respiratory: Denies cough, chest tightness,  and wheezing.   Cardiovascular: Please see history of present illness Gastrointestinal: Denies nausea, vomiting, abdominal pain, diarrhea, constipation, blood in stool and abdominal distention.  Genitourinary: Denies dysuria, + urgency, frequency,  denies hematuria, flank pain and difficulty urinating.  Musculoskeletal: Denies myalgias, back pain, joint swelling, arthralgias and gait problem.  Skin: Denies pallor, rash and wound.  Neurological: Denies dizziness, seizures, syncope, weakness, light-headedness, numbness and headaches.  Hematological: Denies adenopathy. Easy bruising, personal or family bleeding history  Psychiatric/Behavioral: Denies suicidal ideation, mood changes, confusion, nervousness, sleep disturbance and agitation  Past Medical History: Past Medical History  Diagnosis Date  . Hypertension   . Hypercholesteremia   . ARF (acute renal failure)   . TIA (transient ischemic attack)   . COPD (chronic obstructive pulmonary disease)     Past Surgical History  Procedure Laterality Date  . Back surgery    . Orif wrist fracture Left 06/12/2014  . Orif wrist fracture Left 06/12/2014    Procedure: OPEN REDUCTION INTERNAL FIXATION (ORIF) WRIST FRACTURE;  Surgeon: Bradly Bienenstock, MD;  Location: [facility] OR;  Service: Orthopedics;  Laterality: Left;    Medications: Prior to Admission medications   Medication Sig Start Date End Date Taking? Authorizing Provider  acetaminophen (TYLENOL) 325 MG tablet Take 2 tablets (650 mg total) by mouth every 6 (six) hours as needed for fever, headache, mild pain or moderate pain. Patient taking differently: Take 650 mg by mouth 2 (two) times daily as needed for fever, headache, mild pain or moderate pain.  06/14/14  Yes Nonie Hoyer, PA-C  albuterol (PROVENTIL HFA;VENTOLIN HFA) 108 (90 BASE) MCG/ACT inhaler Inhale 2 puffs into the lungs every 6 (six) hours as needed for wheezing or shortness of breath. 04/09/13  Yes Catarina Hartshorn, MD  atorvastatin (  LIPITOR) 20 MG tablet Take 1 tablet (20 mg total) by mouth daily at 6 PM. 08/07/14  Yes Belkys A Regalado, MD  Calcium Carbonate-Vitamin D (CALCIUM + D PO) Take 1 tablet by mouth daily.   Yes Historical Provider, MD  clopidogrel (PLAVIX) 75 MG tablet Take 1  tablet (75 mg total) by mouth daily with breakfast. 04/09/13  Yes Catarina Hartshorn, MD  docusate sodium (COLACE) 100 MG capsule Take 1 capsule (100 mg total) by mouth 2 (two) times daily as needed for mild constipation. 06/14/14  Yes Nonie Hoyer, PA-C  magnesium hydroxide (MILK OF MAGNESIA) 400 MG/5ML suspension Take 30 mLs by mouth daily as needed for mild constipation.   Yes Historical Provider, MD  metoprolol tartrate (LOPRESSOR) 25 MG tablet Take 1 tablet (25 mg total) by mouth 2 (two) times daily. 08/07/14  Yes Belkys A Regalado, MD  mometasone-formoterol (DULERA) 200-5 MCG/ACT AERO Inhale 2 puffs into the lungs 2 (two) times daily. 04/09/13  Yes Catarina Hartshorn, MD  Multiple Vitamins-Minerals (MULTIVITAMINS THER. W/MINERALS) TABS tablet Take 1 tablet by mouth daily.   Yes Historical Provider, MD  ondansetron (ZOFRAN) 4 MG tablet Take 4 mg by mouth every 6 (six) hours as needed for nausea or vomiting.   Yes Historical Provider, MD  polyethylene glycol (MIRALAX / GLYCOLAX) packet Take 17 g by mouth daily as needed. Patient taking differently: Take 17 g by mouth daily as needed for mild constipation.  06/14/14  Yes Nonie Hoyer, PA-C  sertraline (ZOLOFT) 50 MG tablet Take 50 mg by mouth daily.    Yes Historical Provider, MD  diltiazem (CARDIZEM CD) 240 MG 24 hr capsule Take 1 capsule (240 mg total) by mouth daily. Start 04/10/13 Patient not taking: Reported on 08/28/2014 04/10/13   Catarina Hartshorn, MD  ondansetron [facility]) 4 MG/2ML SOLN injection Inject 2 mLs (4 mg total) into the vein every 6 (six) hours as needed for nausea. Patient not taking: Reported on 08/28/2014 08/07/14   Alba Cory, MD    Allergies:  No Known Allergies  Social History: Patient reports that she quit smoking about 31 years ago. She has never used smokeless tobacco. She reports that she does not drink alcohol or use illicit drugs. she currently is a resident of assisted living facility, states that she ambulates with a walker  Family  History: Family history reviewed with the patient Family History  Problem Relation Age of Onset  . Hypertension Mother   . Hypertension Father     Physical Exam: Blood pressure 194/88, pulse 77, temperature 98.2 F (36.8 C), temperature source Oral, resp. rate 23, height  (1.651 m), weight 78.472 kg (173 lb), SpO2 92 %. General: Alert, awake, oriented x3, pleasant, in no acute distress. HEENT: normocephalic, atraumatic, anicteric sclera, pink conjunctiva, pupils equal and reactive to light and accomodation, oropharynx clear Neck: supple, no masses or lymphadenopathy, no goiter, no bruits  Heart: Regular rate and rhythm, without murmurs, rubs or gallops. Lungs: Bibasilar crackles Abdomen: Soft, nontender, nondistended, positive bowel sounds, no masses. Extremities: No clubbing, cyanosis, mild pedal edema with positive pedal pulses. Neuro: Grossly intact, no focal neurological deficits, strength 5/5 upper and lower extremities bilaterally Psych: alert and oriented x 3, normal mood and affect Skin: no rashes or lesions, warm and dry   LABS on Admission:  Basic Metabolic Panel:  Recent Labs Lab 08/28/14 1111  NA 136  K 4.0  CL 99*  CO2 28  GLUCOSE 121*  BUN 26*  CREATININE 1.15*  CALCIUM 9.0   Liver Function Tests:  Recent Labs Lab 08/28/14 1111  AST 20  ALT 16  ALKPHOS 58  BILITOT 1.2  PROT 7.4  ALBUMIN 3.9   No results for input(s): LIPASE, AMYLASE in the last 168 hours. No results for input(s): AMMONIA in the last 168 hours. CBC:  Recent Labs Lab 08/28/14 1111  WBC 6.3  HGB 11.5*  HCT 34.7*  MCV 92.3  PLT 149*   Cardiac Enzymes:  Recent Labs Lab 08/28/14 1111  TROPONINI <0.03   BNP: Invalid input(s): POCBNP CBG: No results for input(s): GLUCAP in the last 168 hours.  Radiological Exams on Admission:  Dg Chest 2 View  08/28/2014   CLINICAL DATA:  Shortness of breath last night.  History of COPD.  EXAM: CHEST  2 VIEW  COMPARISON:   06/13/2014  FINDINGS: Cardiomegaly with vascular congestion and diffuse interstitial prominence which may reflect interstitial edema. Mild hyperinflation compatible with COPD. No effusions. Tortuosity and calcifications of the thoracic aorta.  IMPRESSION: COPD.  Cardiomegaly with vascular congestion and interstitial prominence, possibly interstitial edema.   Electronically Signed   By: Charlett NoseKevin  Dover M.D.   On: 08/28/2014 12:22   Mr Brain Wo Contrast  08/06/2014   CLINICAL DATA:  Frequent falls with memory loss. Duration unspecified.  EXAM: MRI HEAD WITHOUT CONTRAST  TECHNIQUE: Multiplanar, multiecho pulse sequences of the brain and surrounding structures were obtained without intravenous contrast.  COMPARISON:  MR brain 04/04/2013.  FINDINGS: No evidence for acute infarction, hemorrhage, mass lesion, hydrocephalus, or extra-axial fluid. Generalized atrophy. Moderately advanced chronic microvascular ischemic change periventricular and subcortical white matter. RIGHT basal ganglia lenticulostriate infarct, which was acute in January, have evolved to the chronic phase. Chronic lacunes are seen elsewhere in the brain most notably LEFT paramedian pons. No significant chronic hemorrhage. Flow voids are maintained. Mild pannus. Chronic sinus disease. BILATERAL cataract extraction.  IMPRESSION: Chronic changes as described. No acute intracranial findings.  Similar degree of atrophy and small vessel disease compared with January.   Electronically Signed   By: Davonna BellingJohn  Curnes M.D.   On: 08/06/2014 08:02    *I have personally reviewed the images above*  EKG: Independently reviewed. Rate 67, normal sinus rhythm, no acute ST-T wave changes suggestive of ischemia.   Assessment/Plan Principal Problem:   Dyspnea: Possibly due to acute CHF, no wheezing/URI symptoms to suggest respiratory etiology. Elevated BNP 734, chest x-ray with interstitial edema, physical exam consistent with mild CHF - Admit to telemetry, obtain  serial cardiac enzymes, TSH,  - Placed on Lasix 40 mg IV daily, strict I's and O's and daily weights - Obtain 2-D echocardiogram - Continue Plavix, Lopressor. ACE inhibitor indicated if EF less than 40%  Active Problems:   COPD (chronic obstructive pulmonary disease) - Currently stable, no wheezing, continue Dulera    HTN (hypertension) - Currently stable, continue metoprolol    CKD (chronic kidney disease) stage 3, GFR 30-59 ml/min -Currently at baseline, follow closely with IV diuresis  Prior history of CVA - Continue Plavix, statin  Anemia   - Denied any hematochezia or melena, obtain anemia panel   DVT prophylaxis: LOVENOX  CODE STATUS: FULL CODE, d/w patient   Family Communication: Admission, patients condition and plan of care including tests being ordered have been discussed with the patient and friend who indicates understanding and agree with the plan and Code Status  Disposition plan: Further plan will depend as patient's clinical course evolves and further radiologic and laboratory data become available. Likely  back to assisted living facility once stable   Time Spent on Admission: 60 mins   RAI,RIPUDEEP M.D. [facility] 08/28/2014, 2:15 PM Pager: [phone]  If 7PM-7AM, please contact night-coverage [email or web address] Password [email or web address]

## 2014-08-28 NOTE — ED Notes (Signed)
Pt states feeling SOB without CP last night. No other symptoms. " pt states she had "quite a few". Denies SOB at this time

## 2014-08-28 NOTE — ED Notes (Signed)
MD at bedside. STEINL PRESENT

## 2014-08-28 NOTE — ED Notes (Signed)
PER ems-states SOB since last night-history of COPD, states she was having some apneic episodes last night-no SOB at this time

## 2014-08-28 NOTE — ED Notes (Signed)
Spoke with Tare, Consulting civil engineercharge RN

## 2014-08-28 NOTE — ED Provider Notes (Signed)
CSN: [id number]     Arrival date & time 08/28/14  1015 History   First MD Initiated Contact with Patient 08/28/14 1029     Chief Complaint  Patient presents with  . Shortness of Breath     (Consider location/radiation/quality/duration/timing/severity/associated sxs/prior Treatment) Patient is a 81 y.o. female presenting with shortness of breath. The history is provided by the patient and the EMS personnel. The history is limited by the condition of the patient.  Shortness of Breath Associated symptoms: chest pain   Associated symptoms: no abdominal pain, no cough, no fever, no headaches, no neck pain, no rash, no sore throat and no vomiting   Patient w hx copd, presents from ecf via ems.  Pt with ?brief period transient apnea while sleeping last night.  Pt states mild shortness of breath w vague sense tightness in chest.  Denies cough or uri c/o. No fever or chills. Mild ankle edema. No definite orthopnea or pnd.       Past Medical History  Diagnosis Date  . Hypertension   . Hypercholesteremia   . ARF (acute renal failure)   . TIA (transient ischemic attack)   . COPD (chronic obstructive pulmonary disease)    Past Surgical History  Procedure Laterality Date  . Back surgery    . Orif wrist fracture Left 06/12/2014  . Orif wrist fracture Left 06/12/2014    Procedure: OPEN REDUCTION INTERNAL FIXATION (ORIF) WRIST FRACTURE;  Surgeon: Bradly Bienenstock, MD;  Location: [facility] OR;  Service: Orthopedics;  Laterality: Left;   Family History  Problem Relation Age of Onset  . Hypertension Mother   . Hypertension Father    History  Substance Use Topics  . Smoking status: Former Smoker    Quit date: 04/05/1983  . Smokeless tobacco: Never Used  . Alcohol Use: No   OB History    No data available     Review of Systems  Constitutional: Negative for fever and chills.  HENT: Negative for sore throat.   Eyes: Negative for redness.  Respiratory: Negative for cough.   Cardiovascular: Positive  for chest pain and leg swelling.  Gastrointestinal: Negative for vomiting, abdominal pain and diarrhea.  Endocrine: Negative for polyuria.  Genitourinary: Negative for dysuria and flank pain.  Musculoskeletal: Negative for back pain and neck pain.  Skin: Negative for rash.  Neurological: Negative for headaches.  Hematological: Does not bruise/bleed easily.  Psychiatric/Behavioral: Negative for confusion.      Allergies  Review of patient's allergies indicates no known allergies.  Home Medications   Prior to Admission medications   Medication Sig Start Date End Date Taking? Authorizing Provider  acetaminophen (TYLENOL) 325 MG tablet Take 2 tablets (650 mg total) by mouth every 6 (six) hours as needed for fever, headache, mild pain or moderate pain. 06/14/14   Nonie Hoyer, PA-C  albuterol (PROVENTIL HFA;VENTOLIN HFA) 108 (90 BASE) MCG/ACT inhaler Inhale 2 puffs into the lungs every 6 (six) hours as needed for wheezing or shortness of breath. 04/09/13   Catarina Hartshorn, MD  atorvastatin (LIPITOR) 20 MG tablet Take 1 tablet (20 mg total) by mouth daily at 6 PM. 08/07/14   Belkys A Regalado, MD  Calcium Carbonate-Vitamin D (CALCIUM + D PO) Take 1 tablet by mouth daily.    Historical Provider, MD  clopidogrel (PLAVIX) 75 MG tablet Take 1 tablet (75 mg total) by mouth daily with breakfast. 04/09/13   Catarina Hartshorn, MD  diltiazem (CARDIZEM CD) 240 MG 24 hr capsule Take 1 capsule (240  mg total) by mouth daily. Start 04/10/13 04/10/13   Catarina Hartshornavid Tat, MD  docusate sodium (COLACE) 100 MG capsule Take 1 capsule (100 mg total) by mouth 2 (two) times daily as needed for mild constipation. 06/14/14   Nonie HoyerMegan N Baird, PA-C  metoprolol tartrate (LOPRESSOR) 25 MG tablet Take 1 tablet (25 mg total) by mouth 2 (two) times daily. 08/07/14   Belkys A Regalado, MD  mometasone-formoterol (DULERA) 200-5 MCG/ACT AERO Inhale 2 puffs into the lungs 2 (two) times daily. 04/09/13   Catarina Hartshornavid Tat, MD  Multiple Vitamin (MULTIVITAMIN WITH  MINERALS) TABS tablet Take 1 tablet by mouth daily.    Historical Provider, MD  ondansetron (ZOFRAN) 4 MG/2ML SOLN injection Inject 2 mLs (4 mg total) into the vein every 6 (six) hours as needed for nausea. 08/07/14   Belkys A Regalado, MD  polyethylene glycol (MIRALAX / GLYCOLAX) packet Take 17 g by mouth daily as needed. 06/14/14   Nonie HoyerMegan N Baird, PA-C  sertraline (ZOLOFT) 50 MG tablet Take 50 mg by mouth daily.     Historical Provider, MD  VITAMIN E PO Take 1 capsule by mouth daily.    Historical Provider, MD   BP 198/78 mmHg  Pulse 69  Temp(Src) 98.2 F (36.8 C) (Oral)  Resp 16  Ht 5\' 5"  (1.651 m)  Wt 173 lb (78.472 kg)  BMI 28.79 kg/m2  SpO2 94% Physical Exam  Constitutional: She is oriented to person, place, and time. She appears well-developed and well-nourished. No distress.  HENT:  Mouth/Throat: Oropharynx is clear and moist.  Eyes: Conjunctivae are normal. Pupils are equal, round, and reactive to light. No scleral icterus.  Neck: Neck supple. No tracheal deviation present.  Cardiovascular: Normal rate, regular rhythm, normal heart sounds and intact distal pulses.  Exam reveals no gallop and no friction rub.   No murmur heard. Pulmonary/Chest: Effort normal. No respiratory distress.  Rales bases  Abdominal: Soft. Normal appearance and bowel sounds are normal. She exhibits no distension. There is no tenderness.  Genitourinary:  No cva tenderness  Musculoskeletal: She exhibits no edema or tenderness.  Neurological: She is alert and oriented to person, place, and time.  Speech clear/fluent. Mildly hoh. Motor intact bil, stre 5/5. sens grossly intact.   Skin: Skin is warm and dry. No rash noted. She is not diaphoretic.  Psychiatric: She has a normal mood and affect.  Nursing note and vitals reviewed.   ED Course  Procedures (including critical care time) Labs Review  Results for orders placed or performed during the hospital encounter of 08/28/14  CBC  Result Value Ref  Range   WBC 6.3 4.0 - 10.5 K/uL   RBC 3.76 (L) 3.87 - 5.11 MIL/uL   Hemoglobin 11.5 (L) 12.0 - 15.0 g/dL   HCT 16.134.7 (L) 09.636.0 - 04.546.0 %   MCV 92.3 78.0 - 100.0 fL   MCH 30.6 26.0 - 34.0 pg   MCHC 33.1 30.0 - 36.0 g/dL   RDW 40.913.8 81.111.5 - 91.415.5 %   Platelets 149 (L) 150 - 400 K/uL  Comprehensive metabolic panel  Result Value Ref Range   Sodium 136 135 - 145 mmol/L   Potassium 4.0 3.5 - 5.1 mmol/L   Chloride 99 (L) 101 - 111 mmol/L   CO2 28 22 - 32 mmol/L   Glucose, Bld 121 (H) 65 - 99 mg/dL   BUN 26 (H) 6 - 20 mg/dL   Creatinine, Ser 7.821.15 (H) 0.44 - 1.00 mg/dL   Calcium 9.0 8.9 - 10.3  mg/dL   Total Protein 7.4 6.5 - 8.1 g/dL   Albumin 3.9 3.5 - 5.0 g/dL   AST 20 15 - 41 U/L   ALT 16 14 - 54 U/L   Alkaline Phosphatase 58 38 - 126 U/L   Total Bilirubin 1.2 0.3 - 1.2 mg/dL   GFR calc non Af Amer 39 (L) >60 mL/min   GFR calc Af Amer 46 (L) >60 mL/min   Anion gap 9 5 - 15  Troponin I  Result Value Ref Range   Troponin I <0.03 <0.031 ng/mL  Brain natriuretic peptide  Result Value Ref Range   B Natriuretic Peptide 734.9 (H) 0.0 - 100.0 pg/mL   Dg Chest 2 View  08/28/2014   CLINICAL DATA:  Shortness of breath last night.  History of COPD.  EXAM: CHEST  2 VIEW  COMPARISON:  06/13/2014  FINDINGS: Cardiomegaly with vascular congestion and diffuse interstitial prominence which may reflect interstitial edema. Mild hyperinflation compatible with COPD. No effusions. Tortuosity and calcifications of the thoracic aorta.  IMPRESSION: COPD.  Cardiomegaly with vascular congestion and interstitial prominence, possibly interstitial edema.   Electronically Signed   By: Charlett Nose M.D.   On: 08/28/2014 12:22     ED ECG REPORT   Date: 08/28/2014  Rate: 67  Rhythm: normal sinus rhythm  QRS Axis: normal  Intervals: normal  ST/T Wave abnormalities: nonspecific T wave changes  Conduction Disutrbances:none  Narrative Interpretation:   Old EKG Reviewed: changes noted  I have personally reviewed the  EKG tracing   MDM   Iv ns. Continuous pulse ox and monitor.   Labs. Cxr.  Reviewed nursing notes and prior charts for additional history.   cxr c/w vascular congestion/chf. bnp markedly elevated.   Lasix iv.  Recheck pt breathing relatively comfortable.   ?new onset/dx chf.  Will call medical service re obs/admission.    Cathren Laine, MD 08/28/14 [phone]

## 2014-08-28 NOTE — Care Management Note (Signed)
Case Management Note  Patient Details  Name: Yvonne DrossVirginia K Chappelle MRN: [id number] Date of Birth: [birth year]  Subjective/Objective:                    Action/Plan:Obsv status, CC44 given.d/c plan return back to [facility].   Expected Discharge Date:   (UNKNOWN)               Expected Discharge Plan:  [facility]  In-House Referral:  Clinical Social Work  Discharge planning Services  CM Consult  Post Acute Care Choice:    Choice offered to:     DME Arranged:    DME Agency:     HH Arranged:    HH Agency:     Status of Service:  In process, will continue to follow  Medicare Important Message Given:    Date Medicare IM Given:    Medicare IM give by:    Date Additional Medicare IM Given:    Additional Medicare Important Message give by:     If discussed at Long Length of Stay Meetings, dates discussed:    Additional Comments:  Lanier ClamMahabir, Breionna Punt, RN 08/28/2014, 7:11 PM

## 2014-08-29 ENCOUNTER — Observation Stay (HOSPITAL_COMMUNITY): Payer: Medicare Other

## 2014-08-29 DIAGNOSIS — N183 Chronic kidney disease, stage 3 (moderate): Secondary | ICD-10-CM | POA: Diagnosis not present

## 2014-08-29 DIAGNOSIS — J449 Chronic obstructive pulmonary disease, unspecified: Secondary | ICD-10-CM | POA: Diagnosis not present

## 2014-08-29 DIAGNOSIS — R0600 Dyspnea, unspecified: Secondary | ICD-10-CM | POA: Diagnosis not present

## 2014-08-29 DIAGNOSIS — I509 Heart failure, unspecified: Secondary | ICD-10-CM | POA: Diagnosis not present

## 2014-08-29 LAB — BASIC METABOLIC PANEL WITH GFR
Anion gap: 10 (ref 5–15)
BUN: 27 mg/dL — ABNORMAL HIGH (ref 6–20)
CO2: 28 mmol/L (ref 22–32)
Calcium: 8.7 mg/dL — ABNORMAL LOW (ref 8.9–10.3)
Chloride: 100 mmol/L — ABNORMAL LOW (ref 101–111)
Creatinine, Ser: 1.33 mg/dL — ABNORMAL HIGH (ref 0.44–1.00)
GFR calc Af Amer: 38 mL/min — ABNORMAL LOW
GFR calc non Af Amer: 33 mL/min — ABNORMAL LOW
Glucose, Bld: 132 mg/dL — ABNORMAL HIGH (ref 65–99)
Potassium: 3.6 mmol/L (ref 3.5–5.1)
Sodium: 138 mmol/L (ref 135–145)

## 2014-08-29 LAB — URINE CULTURE
COLONY COUNT: NO GROWTH
Culture: NO GROWTH

## 2014-08-29 LAB — TROPONIN I: Troponin I: <0.03 ng/mL

## 2014-08-29 NOTE — Clinical Social Work Note (Signed)
Clinical Social Work Assessment  Patient Details  Name: Yvonne DrossVirginia K [name] MRN: [id number] Date of Birth: November 25, 1918  Date of referral:  08/29/14               Reason for consult:  Facility Placement                Permission sought to share information with:  Facility Industrial/product designerContact Representative Permission granted to share information::  Yes, Verbal Permission Granted  Name::        Agency::     Relationship::     Contact Information:     Housing/Transportation Living arrangements for the past 2 months:  Skilled Nursing Facility, Assisted Living Facility Source of Information:  Patient Patient Interpreter Needed:  None Criminal Activity/Legal Involvement Pertinent to Current Situation/Hospitalization:  No - Comment as needed Significant Relationships:  Friend Lives with:  Facility Resident Do you feel safe going back to the place where you live?  Yes Need for family participation in patient care:  No (Coment)  Care giving concerns:  CSW received consult that patient was admitted from [facility].    Social Worker assessment / plan:  CSW confirmed with patient that she plans to return to [facility] at discharge.   Employment status:  Retired Database administratornsurance information:  Managed Medicare PT Recommendations:    Information / Referral to community resources:     Patient/Family's Response to care:  Patient is agreeable with plan to return to ALF rather than go to SNF, patient [facility] from [facility]/11 - 08/19/14 then admitted to [facility].   Patient/Family's Understanding of and Emotional Response to Diagnosis, Current Treatment, and Prognosis:  Patient informed CSW that she was admitted to the hospital because she could not breath but states that the "wonderful doctors here have cured her". Patient is very appreciative and pleased with the care she has been receiving here and at [facility]. CSW spoke with Burna MortimerWanda at [facility] to confirm that the doctor there  that has been assigned to her is Dr. Einar GradBrent King [facility] phone #: [phone].   Emotional Assessment Appearance:  Appears younger than stated age Attitude/Demeanor/Rapport:    Affect (typically observed):  Pleasant, Happy Orientation:  Oriented to Self, Oriented to Place, Oriented to  Time, Oriented to Situation Alcohol / Substance use:    Psych involvement (Current and /or in the community):     Discharge Needs  Concerns to be addressed:    Readmission within the last 30 days:    Current discharge risk:    Barriers to Discharge:      Arlyss RepressHarrison, Driana Dazey F, LCSW 08/29/2014, 3:14 PM

## 2014-08-29 NOTE — Progress Notes (Signed)
[facility]                                                                              Patient Demographics  Yvonne [name], is a 81 y.o. female, DOB - [birth year], ZOX:[id number]  Admit date - 08/28/2014   Admitting Physician Mariesa Grieder Jenna Luo, MD  Outpatient Primary MD for the patient is Gaye Alken, MD  LOS - 1   Chief Complaint  Patient presents with  . Shortness of Breath       Brief HPI  Patient is a 81 year old female with hypertension, CVA, cognitive deficits, falls, currently from assisted living facility presented to ED with worsening shortness of breath. Patient reported that she has chronic intermittent shortness of breath however in the last 3-4 days, she has noticed worsening of the shortness of breath, worse at night and laying down. She denied any significant chest pain, did mentioned mild vague chest tightness tonight, mild ankle edema. She denied any URI symptoms, fevers, chills, coughing or any wheezing. She also reported frequent urination at night recently. EDP note and her conditions reviewed, ED workup showed BNP of 734, troponin less than 0.03, CBC shows hemoglobin of 11.5, creatinine 1.1. Chest x-ray showed COPD with cardiomegaly and vascular congestion, interstitial edema. Hospitalist service admission requested for dyspnea and possible acute CHF    Assessment & Plan     Principal Problem:  Dyspnea: likely due to acute CHF, no wheezing/URI symptoms to suggest respiratory etiology. Elevated BNP 734, chest x-ray with interstitial edema, physical exam consistent with mild CHF - TSH 3.6, patient reports significant clinical improvement after IV Lasix, negative balance of 2.37 L. Weight down from 176 to 172lbs  -Creatinine trending up to 1.3 today, hold Lasix  -Follow 2-D echocardiogram results - Continue Plavix, Lopressor. ACE inhibitor indicated if EF less than 40% - UA negative for UTI  Active Problems:  COPD (chronic  obstructive pulmonary disease) - Currently stable, no wheezing, continue Dulera   HTN (hypertension) - Currently stable, continue metoprolol   CKD (chronic kidney disease) stage 3, GFR 30-59 ml/min -Creatinine trending up, Lasix placed on hold for today   Prior history of CVA - Continue Plavix, statin  Anemia  - Denied any hematochezia or melena, obtain anemia panel   Code Status: Full code  Family Communication: Discussed in detail with the patient, all imaging results, lab results explained to the patient    Disposition Plan: PT evaluation pending, DC back to facility tomorrow  Time Spent in minutes  25 minutes  Procedures  2-D echo  Consults   None  DVT Prophylaxis Lovenox   Medications  Scheduled Meds: . atorvastatin  20 mg Oral q1800  . clopidogrel  75 mg Oral Q breakfast  . enoxaparin (LOVENOX) injection  30 mg Subcutaneous Q24H  . metoprolol tartrate  25 mg Oral BID  . mometasone-formoterol  2 puff Inhalation BID  . multivitamin with minerals  1 tablet Oral Daily  . sertraline  50 mg Oral Daily  . sodium chloride  3 mL Intravenous Q12H   Continuous Infusions:  PRN Meds:.sodium chloride, acetaminophen, acetaminophen, albuterol, docusate sodium, magnesium hydroxide, ondansetron (ZOFRAN) IV,  polyethylene glycol, sodium chloride   Antibiotics   Anti-infectives    None        Subjective:   [name] was seen and examined today. Feeling a lot better today, sitting up in the chair, no significant shortness of breath this morning. Patient denied dizziness, abdominal pain, N/V/D/C, new weakness, numbess, tingling. No acute events overnight.    Objective:   Blood pressure 138/72, pulse 69, temperature 97.6 F (36.4 C), temperature source Oral, resp. rate 18, height 5\' 5"  (1.651 m), weight 78.291 kg (172 lb 9.6 oz), SpO2 94 %.  Wt Readings from Last 3 Encounters:  08/29/14 78.291 kg (172 lb 9.6 oz)  08/05/14 78.472 kg (173 lb)  06/12/14 80.5  kg (177 lb 7.5 oz)     Intake/Output Summary (Last 24 hours) at 08/29/14 1310 Last data filed at 08/29/14 0600  Gross per 24 hour  Intake    480 ml  Output   2850 ml  Net  -2370 ml    Exam  General: Alert and oriented x 3, NAD  HEENT:  PERRLA, EOMI, Anicteic Sclera, mucous membranes moist.   Neck: Supple, no JVD, no masses  CVS: S1 S2 auscultated, no rubs, murmurs or gallops. Regular rate and rhythm.  Respiratory: Clear to auscultation bilaterally, no wheezing, rales or rhonchi  Abdomen: Soft, nontender, nondistended, + bowel sounds  Ext: no cyanosis clubbing or edema  Neuro: AAOx3, Cr N's II- XII. Strength 5/5 upper and lower extremities bilaterally  Skin: No rashes  Psych: Normal affect and demeanor, alert and oriented x3    Data Review   Micro Results No results found for this or any previous visit (from the past 240 hour(s)).  Radiology Reports Dg Chest 2 View  08/28/2014   CLINICAL DATA:  Shortness of breath last night.  History of COPD.  EXAM: CHEST  2 VIEW  COMPARISON:  06/13/2014  FINDINGS: Cardiomegaly with vascular congestion and diffuse interstitial prominence which may reflect interstitial edema. Mild hyperinflation compatible with COPD. No effusions. Tortuosity and calcifications of the thoracic aorta.  IMPRESSION: COPD.  Cardiomegaly with vascular congestion and interstitial prominence, possibly interstitial edema.   Electronically Signed   By: Charlett NoseKevin  Dover M.D.   On: 08/28/2014 12:22   Yvonne [name] Contrast  08/06/2014   CLINICAL DATA:  Frequent falls with memory loss. Duration unspecified.  EXAM: MRI HEAD WITHOUT CONTRAST  TECHNIQUE: Multiplanar, multiecho pulse sequences of the brain and surrounding structures were obtained without intravenous contrast.  COMPARISON:  Yvonne brain 04/04/2013.  FINDINGS: No evidence for acute infarction, hemorrhage, mass lesion, hydrocephalus, or extra-axial fluid. Generalized atrophy. Moderately advanced chronic microvascular  ischemic change periventricular and subcortical white matter. RIGHT basal ganglia lenticulostriate infarct, which was acute in January, have evolved to the chronic phase. Chronic lacunes are seen elsewhere in the brain most notably LEFT paramedian pons. No significant chronic hemorrhage. Flow voids are maintained. Mild pannus. Chronic sinus disease. BILATERAL cataract extraction.  IMPRESSION: Chronic changes as described. No acute intracranial findings.  Similar degree of atrophy and small vessel disease compared with January.   Electronically Signed   By: Davonna BellingJohn  Curnes M.D.   On: 08/06/2014 08:02    CBC  Recent Labs Lab 08/28/14 1111 08/28/14 1550  WBC 6.3 6.9  HGB 11.5* 11.7*  HCT 34.7* 35.6*  PLT 149* 171  MCV 92.3 93.9  MCH 30.6 30.9  MCHC 33.1 32.9  RDW 13.8 13.8    Chemistries   Recent Labs Lab 08/28/14 1111 08/28/14 1550  08/29/14 0335  NA 136  --  138  K 4.0  --  3.6  CL 99*  --  100*  CO2 28  --  28  GLUCOSE 121*  --  132*  BUN 26*  --  27*  CREATININE 1.15* 0.99 1.33*  CALCIUM 9.0  --  8.7*  AST 20  --   --   ALT 16  --   --   ALKPHOS 58  --   --   BILITOT 1.2  --   --    ------------------------------------------------------------------------------------------------------------------ estimated creatinine clearance is 26.7 mL/min (by C-G formula based on Cr of 1.33). ------------------------------------------------------------------------------------------------------------------ No results for input(s): HGBA1C in the last 72 hours. ------------------------------------------------------------------------------------------------------------------ No results for input(s): CHOL, HDL, LDLCALC, TRIG, CHOLHDL, LDLDIRECT in the last 72 hours. ------------------------------------------------------------------------------------------------------------------  Recent Labs  08/28/14 1550  TSH 3.685    ------------------------------------------------------------------------------------------------------------------ No results for input(s): VITAMINB12, FOLATE, FERRITIN, TIBC, IRON, RETICCTPCT in the last 72 hours.  Coagulation profile No results for input(s): INR, PROTIME in the last 168 hours.  No results for input(s): DDIMER in the last 72 hours.  Cardiac Enzymes  Recent Labs Lab 08/28/14 1550 08/28/14 2146 08/29/14 0335  TROPONINI <0.03 <0.03 <0.03   ------------------------------------------------------------------------------------------------------------------ Invalid input(s): POCBNP  No results for input(s): GLUCAP in the last 72 hours.   Adalberto Metzgar M.D. [facility] 08/29/2014, 1:10 PM  Pager: [phone]   Between 7am to 7pm - call Pager - [phone]  After 7pm go to [email or web address] - password [email or web address]  Call night coverage person covering after 7pm

## 2014-08-29 NOTE — Progress Notes (Signed)
Echocardiogram 2D Echocardiogram has been performed.  Yvonne [name], Yvonne [name] 08/29/2014, 9:34 AM

## 2014-08-29 NOTE — Progress Notes (Signed)
PCP ordered to give the morning dose of Lopressor 25 mg tablets now.

## 2014-08-29 NOTE — Evaluation (Signed)
Physical Therapy Evaluation Patient Details Name: Yvonne [name] MRN: [id number] DOB: [birth year] Today's Date: 08/29/2014   History of Present Illness  80 yo female admitted with dyspnea. Hx of CVA, HTn, L distal radius/ulna fx-ORIF 05/2014, rib fx, falls. Pt is from SNF.   Clinical Impression  On eval, pt required Min guard assist for mobility-able to ambulate ~135 feet with RW. 2 brief standing rest breaks needed-fatigues fairly easily. Recommend return to SNF.     Follow Up Recommendations SNF    Equipment Recommendations  None recommended by PT    Recommendations for Other Services       Precautions / Restrictions Precautions Precautions: Fall Restrictions Weight Bearing Restrictions: No      Mobility  Bed Mobility               General bed mobility comments: pt oob in recliner  Transfers Overall transfer level: Needs assistance Equipment used: Rolling walker (2 wheeled) Transfers: Sit to/from Stand Sit to Stand: Min guard         General transfer comment: close guard for safety.   Ambulation/Gait Ambulation/Gait assistance: Min guard Ambulation Distance (Feet): 135 Feet Assistive device: Rolling walker (2 wheeled) Gait Pattern/deviations: Step-through pattern     General Gait Details: close guard for safety. fatigues fairly easily  Information systems manager [place] (Stroke Patients Only)       Balance                                             Pertinent Vitals/Pain Pain Assessment: No/denies pain    Home Living Family/patient expects to be discharged to:: Skilled nursing facility               Home Equipment: Dan Humphreys - 2 wheels      Prior Function           Comments: uses RW for ambulation     Hand Dominance        Extremity/Trunk Assessment   Upper Extremity Assessment: Generalized weakness           Lower Extremity Assessment: Generalized weakness       Cervical / Trunk Assessment: Normal  Communication      Cognition Arousal/Alertness: Awake/alert Behavior During Therapy: WFL for tasks assessed/performed Overall Cognitive Status: No family/caregiver present to determine baseline cognitive functioning Area of Impairment: Memory     Memory: Decreased short-term memory              General Comments      Exercises        Assessment/Plan    PT Assessment Patient needs continued PT services  PT Diagnosis Difficulty walking;Generalized weakness   PT Problem List Decreased mobility;Decreased balance;Decreased cognition  PT Treatment Interventions Gait training;DME instruction;Functional mobility training;Therapeutic activities;Therapeutic exercise;Patient/family education;Balance training   PT Goals (Current goals can be found in the Care Plan section) Acute Rehab PT Goals Patient Stated Goal: none stated PT Goal Formulation: Patient unable to participate in goal setting Time For Goal Achievement: 09/12/14 Potential to Achieve Goals: Good    Frequency Min 3X/week   Barriers to discharge        Co-evaluation               End of Session Equipment Utilized During Treatment: Gait belt Activity  Tolerance: Patient tolerated treatment well Patient left: in chair;with call bell/phone within reach;with chair alarm set      Functional Assessment Tool Used: clinical judgement Functional Limitation: Mobility: Walking and moving around Mobility: Walking and Moving Around Current Status (Z6109(G8978): At least 1 percent but less than 20 percent impaired, limited or restricted Mobility: Walking and Moving Around Goal Status [phone](G8979): At least 1 percent but less than 20 percent impaired, limited or restricted    Time: [id number] PT Time Calculation (min) (ACUTE ONLY): 12 min   Charges:   PT Evaluation $Initial PT Evaluation Tier I: 1 Procedure     PT G Codes:   PT G-Codes **NOT FOR INPATIENT CLASS** Functional  Assessment Tool Used: clinical judgement Functional Limitation: Mobility: Walking and moving around Mobility: Walking and Moving Around Current Status (N8295(G8978): At least 1 percent but less than 20 percent impaired, limited or restricted Mobility: Walking and Moving Around Goal Status [phone](G8979): At least 1 percent but less than 20 percent impaired, limited or restricted    Rebeca AlertJannie Ulice Follett, MPT Pager: [phone]

## 2014-08-29 NOTE — Progress Notes (Signed)
Patient had a manual B/P of 170/82. PCP on call was notified.

## 2014-08-30 DIAGNOSIS — J449 Chronic obstructive pulmonary disease, unspecified: Secondary | ICD-10-CM | POA: Diagnosis not present

## 2014-08-30 DIAGNOSIS — R0600 Dyspnea, unspecified: Secondary | ICD-10-CM | POA: Diagnosis not present

## 2014-08-30 DIAGNOSIS — N183 Chronic kidney disease, stage 3 (moderate): Secondary | ICD-10-CM | POA: Diagnosis not present

## 2014-08-30 DIAGNOSIS — I509 Heart failure, unspecified: Secondary | ICD-10-CM | POA: Diagnosis not present

## 2014-08-30 LAB — BASIC METABOLIC PANEL
ANION GAP: 12 (ref 5–15)
BUN: 38 mg/dL — ABNORMAL HIGH (ref 6–20)
CO2: 29 mmol/L (ref 22–32)
Calcium: 8.8 mg/dL — ABNORMAL LOW (ref 8.9–10.3)
Chloride: 98 mmol/L — ABNORMAL LOW (ref 101–111)
Creatinine, Ser: 1.34 mg/dL — ABNORMAL HIGH (ref 0.44–1.00)
GFR calc Af Amer: 38 mL/min — ABNORMAL LOW (ref >60)
GFR, EST NON AFRICAN AMERICAN: 33 mL/min — AB (ref >60)
Glucose, Bld: 129 mg/dL — ABNORMAL HIGH (ref 65–99)
Potassium: 3.8 mmol/L (ref 3.5–5.1)
Sodium: 139 mmol/L (ref 135–145)

## 2014-08-30 MED ORDER — FLUTICASONE-SALMETEROL 250-50 MCG/DOSE IN AEPB: 1.0000 | INHALATION_SPRAY | Freq: Two times a day (BID) | RESPIRATORY_TRACT | Status: AC

## 2014-08-30 MED ORDER — FUROSEMIDE 20 MG PO TABS: 20.0000 mg | ORAL_TABLET | ORAL | Status: DC

## 2014-08-30 NOTE — Care Management Note (Signed)
Case Management Note  Patient Details  Name: Yvonne DrossVirginia K [name] MRN: [id number] Date of Birth: [birth year]  Subjective/Objective: [phone].Also informed MD to change dulera to Advair since dulera needed prior auth.                   Action/Plan:d/c plan return to [facility].   Expected Discharge Date:   (UNKNOWN)               Expected Discharge Plan:  Home w Home Health Services (From [facility]  In-House Referral:  Clinical Social Work  Discharge planning Services  CM Consult  Post Acute Care Choice:    Choice offered to:     DME Arranged:    DME Agency:     HH Arranged:  RN (lab work) [employer] Agency:  [facility]  Status of Service:  Completed, signed off  [employer] Important Message Given:    Date Medicare IM Given:    Medicare IM give by:    Date Additional Medicare IM Given:    Additional Medicare Important Message give by:     If discussed at Long Length of Stay Meetings, dates discussed:    Additional Comments:  Lanier ClamMahabir, Wyvonne Carda, RN 08/30/2014, 3:00 PM

## 2014-08-30 NOTE — Care Management Note (Signed)
Case Management Note  Patient Details  Name: Yvonne [name] MRN: [id number] Date of Birth: [birth year]  Subjective/Objective: faxed HHRN order to [facility] w/confirmation.Script for advair in shadow chart.CSW notified.                   Action/Plan:   Expected Discharge Date:   (UNKNOWN)               Expected Discharge Plan:  Home w Home Health Services (From [facility]  In-House Referral:  Clinical Social Work  Discharge planning Services  CM Consult  Post Acute Care Choice:    Choice offered to:     DME Arranged:    DME Agency:     HH Arranged:  RN (lab work) [employer] Agency:  [facility]  Status of Service:  Completed, signed off  [employer] Important Message Given:    Date Medicare IM Given:    Medicare IM give by:    Date Additional Medicare IM Given:    Additional Medicare Important Message give by:     If discussed at Long Length of Stay Meetings, dates discussed:    Additional Comments:  Lanier ClamMahabir, Kiandra Sanguinetti, RN 08/30/2014, 3:13 PM

## 2014-08-30 NOTE — Care Management Note (Signed)
Case Management Note  Patient Details  Name: Yvonne DrossVirginia K [name] MRN: [id number] Date of Birth: [birth year]  Subjective/Objective:                    Action/Plan:d/c snf.   Expected Discharge Date:   (UNKNOWN)               Expected Discharge Plan:  [facility]  In-House Referral:  Clinical Social Work  Discharge planning Services  CM Consult  Post Acute Care Choice:    Choice offered to:     DME Arranged:    DME Agency:     HH Arranged:    HH Agency:     Status of Service:  Completed, signed off  Medicare Important Message Given:    Date Medicare IM Given:    Medicare IM give by:    Date Additional Medicare IM Given:    Additional Medicare Important Message give by:     If discussed at Long Length of Stay Meetings, dates discussed:    Additional Comments:  Lanier ClamMahabir, Ormand Senn, RN 08/30/2014, 12:40 PM

## 2014-08-30 NOTE — Discharge Summary (Signed)
Physician Discharge Summary   Patient ID: Yvonne [name] MRN: [id number] DOB/AGE: December 21, 1918 81 y.o.  Admit date: 08/28/2014 Discharge date: 08/30/2014  Primary Care Physician:  Gaye Alken, MD  Discharge Diagnoses:    . Dyspnea   Mild acute diastolic CHF exacerbation  . HTN (hypertension) . COPD (chronic obstructive pulmonary disease) . CKD (chronic kidney disease) stage 3, GFR 30-59 ml/min  Consults: None   Recommendations for Outpatient Follow-up:  Please check a BMET on Monday, 6/6, for renal function. If creatinine function trending up, may change Lasix to as needed for shortness of breath or peripheral edema.  TESTS THAT NEED FOLLOW-UP BMET   DIET: Low-sodium heart healthy diet    Allergies:  No Known Allergies   Discharge Medications:   Medication List    STOP taking these medications        diltiazem 240 MG 24 hr capsule  Commonly known as:  CARDIZEM CD      TAKE these medications        acetaminophen 325 MG tablet  Commonly known as:  TYLENOL  Take 2 tablets (650 mg total) by mouth every 6 (six) hours as needed for fever, headache, mild pain or moderate pain.     albuterol 108 (90 BASE) MCG/ACT inhaler  Commonly known as:  PROVENTIL HFA;VENTOLIN HFA  Inhale 2 puffs into the lungs every 6 (six) hours as needed for wheezing or shortness of breath.     atorvastatin 20 MG tablet  Commonly known as:  LIPITOR  Take 1 tablet (20 mg total) by mouth daily at 6 PM.     CALCIUM + D PO  Take 1 tablet by mouth daily.     clopidogrel 75 MG tablet  Commonly known as:  PLAVIX  Take 1 tablet (75 mg total) by mouth daily with breakfast.     docusate sodium 100 MG capsule  Commonly known as:  COLACE  Take 1 capsule (100 mg total) by mouth 2 (two) times daily as needed for mild constipation.     furosemide 20 MG tablet  Commonly known as:  LASIX  Take 1 tablet (20 mg total) by mouth every other day.  Start taking on:  08/31/2014     magnesium  hydroxide 400 MG/5ML suspension  Commonly known as:  MILK OF MAGNESIA  Take 30 mLs by mouth daily as needed for mild constipation.     metoprolol tartrate 25 MG tablet  Commonly known as:  LOPRESSOR  Take 1 tablet (25 mg total) by mouth 2 (two) times daily.     mometasone-formoterol 200-5 MCG/ACT Aero  Commonly known as:  DULERA  Inhale 2 puffs into the lungs 2 (two) times daily.     multivitamins ther. w/minerals Tabs tablet  Take 1 tablet by mouth daily.     ondansetron 4 MG tablet  Commonly known as:  ZOFRAN  Take 4 mg by mouth every 6 (six) hours as needed for nausea or vomiting.     polyethylene glycol packet  Commonly known as:  MIRALAX / GLYCOLAX  Take 17 g by mouth daily as needed.     sertraline 50 MG tablet  Commonly known as:  ZOLOFT  Take 50 mg by mouth daily.         Brief H and P: For complete details please refer to admission H and P, but in brief Patient is a 81 year old female with hypertension, CVA, cognitive deficits, falls, currently from assisted living facility presented to ED with worsening shortness of  breath. Patient reported that she has chronic intermittent shortness of breath however in the last 3-4 days, she has noticed worsening of the shortness of breath, worse at night and laying down. She denied any significant chest pain, did mentioned mild vague chest tightness tonight, mild ankle edema. She denied any URI symptoms, fevers, chills, coughing or any wheezing. She also reported frequent urination at night recently. EDP note and her conditions reviewed, ED workup showed BNP of 734, troponin less than 0.03, CBC shows hemoglobin of 11.5, creatinine 1.1. Chest x-ray showed COPD with cardiomegaly and vascular congestion, interstitial edema. Hospitalist service admission requested for dyspnea and possible acute CHF  Hospital Course:    Dyspnea: likely due to acute diastolic CHF, no wheezing/URI symptoms to suggest respiratory etiology. Elevated BNP  734, chest x-ray with interstitial edema, physical exam consistent with mild CHF - TSH 3.6, patient reported significant clinical improvement after IV Lasix, negative balance of 2.37 L with IV diuresis. Weight down from 176 to 171lbs. however subsequently creatinine trended up to 1.3, hence Lasix placed on hold She may restart oral Lasix 20 mg every other day tomorrow 6/4. Recheck BMET on 6/6 to follow the renal function.   Patient was ruled out for acute ACS. 2-D echo showed EF of 55-60% with grade 2 diastolic dysfunction  Continue Plavix, Lopressor. ACE inhibitor indicated if EF less than 40% UA negative for UTI    COPD (chronic obstructive pulmonary disease) - Currently stable, no wheezing, continue Dulera   HTN (hypertension) - Currently stable, continue metoprolol   CKD (chronic kidney disease) stage 3, GFR 30-59 ml/min -Creatinine currently stable at 1.3, follow BMET closely for renal function with Lasix  Prior history of CVA - Continue Plavix, statin  Anemia  - Denied any hematochezia or melena, follow CBC, outpatient closely   Day of Discharge BP 145/72 mmHg  Pulse 80  Temp(Src) 98.2 F (36.8 C) (Oral)  Resp 19  Ht  (1.651 m)  Wt 78 kg (171 lb 15.3 oz)  BMI 28.62 kg/m2  SpO2 99%  Physical Exam: General: Alert and awake oriented x3 not in any acute distress. HEENT: anicteric sclera, pupils reactive to light and accommodation CVS: S1-S2 clear no murmur rubs or gallops Chest: clear to auscultation bilaterally, no wheezing rales or rhonchi Abdomen: soft nontender, nondistended, normal bowel sounds Extremities: no cyanosis, clubbing or edema noted bilaterally Neuro: Cranial nerves II-XII intact, no focal neurological deficits   The results of significant diagnostics from this hospitalization (including imaging, microbiology, ancillary and laboratory) are listed below for reference.    LAB RESULTS: Basic Metabolic Panel:  Recent Labs Lab 08/29/14 0335  08/30/14 0525  NA 138 139  K 3.6 3.8  CL 100* 98*  CO2 28 29  GLUCOSE 132* 129*  BUN 27* 38*  CREATININE 1.33* 1.34*  CALCIUM 8.7* 8.8*   Liver Function Tests:  Recent Labs Lab 08/28/14 1111  AST 20  ALT 16  ALKPHOS 58  BILITOT 1.2  PROT 7.4  ALBUMIN 3.9   No results for input(s): LIPASE, AMYLASE in the last 168 hours. No results for input(s): AMMONIA in the last 168 hours. CBC:  Recent Labs Lab 08/28/14 1111 08/28/14 1550  WBC 6.3 6.9  HGB 11.5* 11.7*  HCT 34.7* 35.6*  MCV 92.3 93.9  PLT 149* 171   Cardiac Enzymes:  Recent Labs Lab 08/28/14 2146 08/29/14 0335  TROPONINI <0.03 <0.03   BNP: Invalid input(s): POCBNP CBG: No results for input(s): GLUCAP in the last 168  hours.  Significant Diagnostic Studies:  Dg Chest 2 View  08/28/2014   CLINICAL DATA:  Shortness of breath last night.  History of COPD.  EXAM: CHEST  2 VIEW  COMPARISON:  06/13/2014  FINDINGS: Cardiomegaly with vascular congestion and diffuse interstitial prominence which may reflect interstitial edema. Mild hyperinflation compatible with COPD. No effusions. Tortuosity and calcifications of the thoracic aorta.  IMPRESSION: COPD.  Cardiomegaly with vascular congestion and interstitial prominence, possibly interstitial edema.   Electronically Signed   By: Charlett NoseKevin  Dover M.D.   On: 08/28/2014 12:22    2D ECHO: Study Conclusions  - Left ventricle: The cavity size was normal. There was moderate concentric hypertrophy. Systolic function was normal. The estimated ejection fraction was in the range of 55% to 60%. Wall motion was normal; there were no regional wall motion abnormalities. Features are consistent with a pseudonormal left ventricular filling pattern, with concomitant abnormal relaxation and increased filling pressure (grade 2 diastolic dysfunction). - Aortic valve: Trileaflet; mildly thickened, mildly calcified leaflets. There was mild regurgitation. - Mitral valve: Mildly  calcified annulus. There was moderate to severe regurgitation. - Left atrium: The atrium was moderately to severely dilated.  Impressions:  - No cardiac source of emboli was indentified.   Disposition and Follow-up: Discharge Instructions    (HEART FAILURE PATIENTS) Call MD:  Anytime you have any of the following symptoms: 1) 3 pound weight gain in 24 hours or 5 pounds in 1 week 2) shortness of breath, with or without a dry hacking cough 3) swelling in the hands, feet or stomach 4) if you have to sleep on extra pillows at night in order to breathe.    Complete by:  As directed      Diet - low sodium heart healthy    Complete by:  As directed      Discharge instructions    Complete by:  As directed   Please check BMET/labs next week     Increase activity slowly    Complete by:  As directed             DISPOSITION: Assisted living facility   DISCHARGE FOLLOW-UP Follow-up Information    Follow up with Gaye AlkenBARNES,ELIZABETH STEWART, MD. Schedule an appointment as soon as possible for a visit in 1 week.   Specialty:  Family Medicine   Why:  for hospital follow-up, obtain labs, BMET   Contact information:   [address] [place] [address] [phone]        Time spent on Discharge: 35 minutes  Signed:   Damarious Holtsclaw M.D. [facility] 08/30/2014, 1:25 PM Pager: [phone]

## 2014-08-30 NOTE — Clinical Social Work Note (Signed)
CSW prepared discharge packet, communicated with ALF, faxed information over to ALF and spoke with pt's friend who will be transporting pt back to her ALF  CSW left the discharge packet with RN.  No further CSW needs  .Elray Bubaegina Nazar Kuan, LCSW [facility] Clinical Social Worker - Weekend Coverage cell #: [phone]

## 2014-10-02 ENCOUNTER — Emergency Department (HOSPITAL_COMMUNITY): Payer: Medicare Other

## 2014-10-02 ENCOUNTER — Inpatient Hospital Stay (HOSPITAL_COMMUNITY)
Admission: EM | Admit: 2014-10-02 | Discharge: 2014-10-03 | DRG: 292 | Disposition: A | Discharge status: Nursing Home (Includes ICF) | Payer: Medicare Other | Attending: Family Medicine | Admitting: Family Medicine

## 2014-10-02 ENCOUNTER — Encounter (HOSPITAL_COMMUNITY): Payer: Self-pay | Admitting: Emergency Medicine

## 2014-10-02 DIAGNOSIS — I5031 Acute diastolic (congestive) heart failure: Principal | ICD-10-CM | POA: Diagnosis present

## 2014-10-02 DIAGNOSIS — Z79899 Other long term (current) drug therapy: Secondary | ICD-10-CM

## 2014-10-02 DIAGNOSIS — R0602 Shortness of breath: Secondary | ICD-10-CM | POA: Diagnosis present

## 2014-10-02 DIAGNOSIS — M4854XA Collapsed vertebra, not elsewhere classified, thoracic region, initial encounter for fracture: Secondary | ICD-10-CM | POA: Diagnosis present

## 2014-10-02 DIAGNOSIS — E780 Pure hypercholesterolemia: Secondary | ICD-10-CM | POA: Diagnosis present

## 2014-10-02 DIAGNOSIS — F0390 Unspecified dementia without behavioral disturbance: Secondary | ICD-10-CM | POA: Diagnosis present

## 2014-10-02 DIAGNOSIS — I10 Essential (primary) hypertension: Secondary | ICD-10-CM | POA: Diagnosis present

## 2014-10-02 DIAGNOSIS — Z7902 Long term (current) use of antithrombotics/antiplatelets: Secondary | ICD-10-CM | POA: Diagnosis not present

## 2014-10-02 DIAGNOSIS — I340 Nonrheumatic mitral (valve) insufficiency: Secondary | ICD-10-CM | POA: Diagnosis present

## 2014-10-02 DIAGNOSIS — J449 Chronic obstructive pulmonary disease, unspecified: Secondary | ICD-10-CM | POA: Diagnosis present

## 2014-10-02 DIAGNOSIS — X58XXXA Exposure to other specified factors, initial encounter: Secondary | ICD-10-CM | POA: Diagnosis present

## 2014-10-02 DIAGNOSIS — Z87891 Personal history of nicotine dependence: Secondary | ICD-10-CM

## 2014-10-02 DIAGNOSIS — Z8673 Personal history of transient ischemic attack (TIA), and cerebral infarction without residual deficits: Secondary | ICD-10-CM | POA: Diagnosis not present

## 2014-10-02 DIAGNOSIS — Z8249 Family history of ischemic heart disease and other diseases of the circulatory system: Secondary | ICD-10-CM | POA: Diagnosis not present

## 2014-10-02 DIAGNOSIS — N189 Chronic kidney disease, unspecified: Secondary | ICD-10-CM | POA: Diagnosis present

## 2014-10-02 DIAGNOSIS — I509 Heart failure, unspecified: Secondary | ICD-10-CM

## 2014-10-02 DIAGNOSIS — S22000A Wedge compression fracture of unspecified thoracic vertebra, initial encounter for closed fracture: Secondary | ICD-10-CM

## 2014-10-02 DIAGNOSIS — I129 Hypertensive chronic kidney disease with stage 1 through stage 4 chronic kidney disease, or unspecified chronic kidney disease: Secondary | ICD-10-CM | POA: Diagnosis present

## 2014-10-02 LAB — CBC
HCT: 36.3 % (ref 36.0–46.0)
Hemoglobin: 11.8 g/dL — ABNORMAL LOW (ref 12.0–15.0)
MCH: 30.7 pg (ref 26.0–34.0)
MCHC: 32.5 g/dL (ref 30.0–36.0)
MCV: 94.5 fL (ref 78.0–100.0)
PLATELETS: 187 10*3/uL (ref 150–400)
RBC: 3.84 MIL/uL — ABNORMAL LOW (ref 3.87–5.11)
RDW: 15 % (ref 11.5–15.5)
WBC: 7.7 10*3/uL (ref 4.0–10.5)

## 2014-10-02 LAB — BLOOD GAS, ARTERIAL
Acid-Base Excess: 1.4 mmol/L (ref 0.0–2.0)
BICARBONATE: 25.2 meq/L — AB (ref 20.0–24.0)
Drawn by: 331471
O2 CONTENT: 3 L/min
O2 SAT: 93 %
Patient temperature: 98.6
TCO2: 22.9 mmol/L (ref 0–100)
pCO2 arterial: 38.8 mmHg (ref 35.0–45.0)
pH, Arterial: 7.428 (ref 7.350–7.450)
pO2, Arterial: 69.9 mmHg — ABNORMAL LOW (ref 80.0–100.0)

## 2014-10-02 LAB — BASIC METABOLIC PANEL
Anion gap: 11 (ref 5–15)
BUN: 28 mg/dL — AB (ref 6–20)
CALCIUM: 9 mg/dL (ref 8.9–10.3)
CO2: 25 mmol/L (ref 22–32)
CREATININE: 1.01 mg/dL — AB (ref 0.44–1.00)
Chloride: 100 mmol/L — ABNORMAL LOW (ref 101–111)
GFR, EST AFRICAN AMERICAN: 53 mL/min — AB (ref >60)
GFR, EST NON AFRICAN AMERICAN: 46 mL/min — AB (ref >60)
GLUCOSE: 138 mg/dL — AB (ref 65–99)
Potassium: 3.7 mmol/L (ref 3.5–5.1)
Sodium: 136 mmol/L (ref 135–145)

## 2014-10-02 LAB — BRAIN NATRIURETIC PEPTIDE: B Natriuretic Peptide: 704.7 pg/mL — ABNORMAL HIGH (ref 0.0–100.0)

## 2014-10-02 LAB — I-STAT TROPONIN, ED: Troponin i, poc: 0 ng/mL (ref 0.00–0.08)

## 2014-10-02 MED ORDER — FUROSEMIDE 10 MG/ML IJ SOLN
40.0000 mg | Freq: Once | INTRAMUSCULAR | Status: AC
  Administered 2014-10-02: 40 mg via INTRAVENOUS
  Filled 2014-10-02: qty 4

## 2014-10-02 MED ORDER — ATORVASTATIN CALCIUM 10 MG PO TABS
20.0000 mg | ORAL_TABLET | Freq: Every day | ORAL | Status: DC
  Administered 2014-10-02: 20 mg via ORAL
  Filled 2014-10-02: qty 2

## 2014-10-02 MED ORDER — POTASSIUM CHLORIDE CRYS ER 10 MEQ PO TBCR
10.0000 meq | EXTENDED_RELEASE_TABLET | Freq: Every day | ORAL | Status: DC
  Administered 2014-10-02 – 2014-10-03 (×2): 10 meq via ORAL
  Filled 2014-10-02 (×2): qty 1

## 2014-10-02 MED ORDER — ALBUTEROL SULFATE (2.5 MG/3ML) 0.083% IN NEBU: 2.5000 mg | INHALATION_SOLUTION | Freq: Four times a day (QID) | RESPIRATORY_TRACT | Status: DC | PRN

## 2014-10-02 MED ORDER — SODIUM CHLORIDE 0.9 % IV SOLN: 250.0000 mL | INTRAVENOUS | Status: DC | PRN

## 2014-10-02 MED ORDER — HYDRALAZINE HCL 20 MG/ML IJ SOLN
5.0000 mg | Freq: Three times a day (TID) | INTRAMUSCULAR | Status: DC | PRN
  Administered 2014-10-02: 5 mg via INTRAVENOUS
  Filled 2014-10-02: qty 1

## 2014-10-02 MED ORDER — ACETAMINOPHEN 325 MG PO TABS
650.0000 mg | ORAL_TABLET | ORAL | Status: DC | PRN
  Filled 2014-10-02: qty 2

## 2014-10-02 MED ORDER — ONDANSETRON HCL 4 MG/2ML IJ SOLN: 4.0000 mg | Freq: Four times a day (QID) | INTRAMUSCULAR | Status: DC | PRN

## 2014-10-02 MED ORDER — SERTRALINE HCL 50 MG PO TABS
50.0000 mg | ORAL_TABLET | Freq: Every day | ORAL | Status: DC
  Administered 2014-10-02 – 2014-10-03 (×2): 50 mg via ORAL
  Filled 2014-10-02 (×2): qty 1

## 2014-10-02 MED ORDER — SODIUM CHLORIDE 0.9 % IJ SOLN
3.0000 mL | Freq: Two times a day (BID) | INTRAMUSCULAR | Status: DC
  Administered 2014-10-02 – 2014-10-03 (×3): 3 mL via INTRAVENOUS

## 2014-10-02 MED ORDER — METOPROLOL TARTRATE 25 MG PO TABS
25.0000 mg | ORAL_TABLET | Freq: Two times a day (BID) | ORAL | Status: DC
  Administered 2014-10-03: 25 mg via ORAL
  Filled 2014-10-02: qty 1

## 2014-10-02 MED ORDER — SODIUM CHLORIDE 0.9 % IJ SOLN
3.0000 mL | INTRAMUSCULAR | Status: DC | PRN
  Administered 2014-10-02: 3 mL via INTRAVENOUS
  Filled 2014-10-02: qty 3

## 2014-10-02 MED ORDER — FUROSEMIDE 10 MG/ML IJ SOLN
40.0000 mg | Freq: Every day | INTRAMUSCULAR | Status: DC
  Administered 2014-10-03: 40 mg via INTRAVENOUS
  Filled 2014-10-02: qty 4

## 2014-10-02 MED ORDER — MOMETASONE FURO-FORMOTEROL FUM 100-5 MCG/ACT IN AERO
2.0000 | INHALATION_SPRAY | Freq: Two times a day (BID) | RESPIRATORY_TRACT | Status: DC
  Administered 2014-10-02 – 2014-10-03 (×2): 2 via RESPIRATORY_TRACT
  Filled 2014-10-02: qty 8.8

## 2014-10-02 MED ORDER — DOCUSATE SODIUM 100 MG PO CAPS: 100.0000 mg | ORAL_CAPSULE | Freq: Two times a day (BID) | ORAL | Status: DC | PRN

## 2014-10-02 MED ORDER — HEPARIN SODIUM (PORCINE) 5000 UNIT/ML IJ SOLN
5000.0000 [IU] | Freq: Three times a day (TID) | INTRAMUSCULAR | Status: DC
  Administered 2014-10-02 – 2014-10-03 (×3): 5000 [IU] via SUBCUTANEOUS
  Filled 2014-10-02 (×2): qty 1

## 2014-10-02 MED ORDER — CLOPIDOGREL BISULFATE 75 MG PO TABS
75.0000 mg | ORAL_TABLET | Freq: Every day | ORAL | Status: DC
  Administered 2014-10-02 – 2014-10-03 (×2): 75 mg via ORAL
  Filled 2014-10-02 (×2): qty 1

## 2014-10-02 NOTE — ED Notes (Signed)
Pt can go at 9:30 am

## 2014-10-02 NOTE — ED Provider Notes (Addendum)
CSN: [id number]     Arrival date & time 10/02/14  0722 History   First MD Initiated Contact with Patient 10/02/14 [phone]     Chief Complaint  Patient presents with  . Shortness of Breath    Patient is a 81 y.o. female presenting with shortness of breath. The history is provided by the patient.  Shortness of Breath Severity:  Severe Duration: Pt woke up this morning feeling short of breath.   She does not know when it started but yesterday afternoon when they last saw the patient she was fine. Timing:  Constant Chronicity:  New Context: not emotional upset, not smoke exposure and not URI   Relieved by:  Nothing Ineffective treatments:  None tried Associated symptoms: no abdominal pain, no cough, no fever, no sputum production, no vomiting and no wheezing   Associated symptoms comment:  Leg swelling  Pt was in the hospital last month for similar symptoms.  She is not sure what she was treated for.  Past Medical History  Diagnosis Date  . Hypertension   . Hypercholesteremia   . ARF (acute renal failure)   . TIA (transient ischemic attack)    Past Surgical History  Procedure Laterality Date  . Back surgery    . Orif wrist fracture Left 06/12/2014  . Orif wrist fracture Left 06/12/2014    Procedure: OPEN REDUCTION INTERNAL FIXATION (ORIF) WRIST FRACTURE;  Surgeon: Bradly BienenstockFred Ortmann, MD;  Location: [facility] OR;  Service: Orthopedics;  Laterality: Left;   Family History  Problem Relation Age of Onset  . Hypertension Mother   . Hypertension Father    History  Substance Use Topics  . Smoking status: Former Smoker    Quit date: 04/05/1983  . Smokeless tobacco: Never Used  . Alcohol Use: No   OB History    No data available     Review of Systems  Constitutional: Negative for fever.  Respiratory: Positive for shortness of breath. Negative for cough, sputum production and wheezing.   Gastrointestinal: Negative for vomiting and abdominal pain.  All other systems reviewed and are  negative.     Allergies  Review of patient's allergies indicates no known allergies.  Home Medications   Prior to Admission medications   Medication Sig Start Date End Date Taking? Authorizing Provider  acetaminophen (TYLENOL) 325 MG tablet Take 2 tablets (650 mg total) by mouth every 6 (six) hours as needed for fever, headache, mild pain or moderate pain. Patient taking differently: Take 650 mg by mouth 2 (two) times daily as needed for fever, headache, mild pain or moderate pain.  06/14/14  Yes Nonie HoyerMegan N Baird, PA-C  albuterol (PROVENTIL HFA;VENTOLIN HFA) 108 (90 BASE) MCG/ACT inhaler Inhale 2 puffs into the lungs every 6 (six) hours as needed for wheezing or shortness of breath. 04/09/13  Yes Catarina Hartshornavid Tat, MD  atorvastatin (LIPITOR) 20 MG tablet Take 1 tablet (20 mg total) by mouth daily at 6 PM. 08/07/14  Yes Belkys A Regalado, MD  Calcium Carbonate-Vitamin D (CALCIUM + D PO) Take 1 tablet by mouth daily.   Yes Historical Provider, MD  clopidogrel (PLAVIX) 75 MG tablet Take 1 tablet (75 mg total) by mouth daily with breakfast. 04/09/13  Yes Catarina Hartshornavid Tat, MD  docusate sodium (COLACE) 100 MG capsule Take 1 capsule (100 mg total) by mouth 2 (two) times daily as needed for mild constipation. 06/14/14  Yes Nonie HoyerMegan N Baird, PA-C  Fluticasone-Salmeterol (ADVAIR DISKUS) 250-50 MCG/DOSE AEPB Inhale 1 puff into the lungs 2 (two) times  daily. 08/30/14  Yes Ripudeep Jenna Luo, MD  furosemide (LASIX) 20 MG tablet Take 1 tablet (20 mg total) by mouth every other day. Patient taking differently: Take 20 mg by mouth daily.  08/31/14  Yes Ripudeep Jenna Luo, MD  magnesium hydroxide (MILK OF MAGNESIA) 400 MG/5ML suspension Take 30 mLs by mouth daily as needed for mild constipation.   Yes Historical Provider, MD  metoprolol tartrate (LOPRESSOR) 25 MG tablet Take 1 tablet (25 mg total) by mouth 2 (two) times daily. 08/07/14  Yes Belkys A Regalado, MD  Multiple Vitamins-Minerals (MULTIVITAMINS THER. W/MINERALS) TABS tablet Take 1 tablet  by mouth daily.   Yes Historical Provider, MD  ondansetron (ZOFRAN) 4 MG tablet Take 4 mg by mouth every 6 (six) hours as needed for nausea or vomiting.   Yes Historical Provider, MD  polyethylene glycol (MIRALAX / GLYCOLAX) packet Take 17 g by mouth daily as needed. Patient taking differently: Take 17 g by mouth daily as needed for mild constipation.  06/14/14  Yes Nonie Hoyer, PA-C  sertraline (ZOLOFT) 50 MG tablet Take 50 mg by mouth daily.    Yes Historical Provider, MD   BP 143/98 mmHg  Pulse 84  Temp(Src) 97.7 F (36.5 C)  Resp 22  SpO2 95% Physical Exam  Constitutional: No distress.  HENT:  Head: Normocephalic and atraumatic.  Right Ear: External ear normal.  Left Ear: External ear normal.  Eyes: Conjunctivae are normal. Right eye exhibits no discharge. Left eye exhibits no discharge. No scleral icterus.  Neck: Neck supple. No tracheal deviation present.  Cardiovascular: Normal rate, regular rhythm and intact distal pulses.   Pulmonary/Chest: Accessory muscle usage present. No stridor. Tachypnea noted. No respiratory distress. She has no wheezes. She has rales in the right lower field and the left lower field.  Abdominal: Soft. Bowel sounds are normal. She exhibits no distension. There is no tenderness. There is no rebound and no guarding.  Musculoskeletal: She exhibits edema ( bilateral lower leg edema below the knees). She exhibits no tenderness.  Neurological: She is alert. She has normal strength. No cranial nerve deficit (no facial droop, extraocular movements intact, no slurred speech) or sensory deficit. She exhibits normal muscle tone. She displays no seizure activity. Coordination normal.  Skin: Skin is warm and dry. No rash noted.  Psychiatric: She has a normal mood and affect.  Nursing note and vitals reviewed.   ED Course  Procedures (including critical care time) Labs Review Labs Reviewed  BASIC METABOLIC PANEL - Abnormal; Notable for the following:    Chloride  100 (*)    Glucose, Bld 138 (*)    BUN 28 (*)    Creatinine, Ser 1.01 (*)    GFR calc non Af Amer 46 (*)    GFR calc Af Amer 53 (*)    All other components within normal limits  CBC - Abnormal; Notable for the following:    RBC 3.84 (*)    Hemoglobin 11.8 (*)    All other components within normal limits  BLOOD GAS, ARTERIAL - Abnormal; Notable for the following:    pO2, Arterial 69.9 (*)    Bicarbonate 25.2 (*)    All other components within normal limits  BRAIN NATRIURETIC PEPTIDE  I-STAT TROPOININ, ED    Imaging Review Dg Chest 2 View  10/02/2014   CLINICAL DATA:  New onset shortness of breath last night.  EXAM: CHEST  2 VIEW  COMPARISON:  08/28/2014  FINDINGS: Cardiac silhouette remains enlarged. Tortuosity and  calcification are again noted involving the thoracic aorta. Pulmonary vascular congestion has increased with mild interstitial opacities throughout both lungs. More confluent opacities in both lung bases may represent atelectasis. There may be small bilateral pleural effusions. Lungs overall remain mildly hyperinflated. There is a new severe mid thoracic vertebral body compression fracture, approximately T7. Multiple old right-sided rib fractures are again noted.  IMPRESSION: 1. Increased pulmonary vascular congestion and interstitial opacities suggestive of mild edema. 2. Bibasilar opacities may represent atelectasis. 3. New severe mid thoracic vertebral compression fracture.   Electronically Signed   By: Sebastian Ache   On: 10/02/2014 08:07     EKG Interpretation   Date/Time:  Wednesday October 02 2014 07:26:52 EDT Ventricular Rate:  95 PR Interval:  194 QRS Duration: 91 QT Interval:  376 QTC Calculation: 473 R Axis:   -16 Text Interpretation:  Sinus tachycardia Atrial premature complexes Low  voltage, precordial leads Consider anterior infarct Baseline wander in  lead(s) V3 Confirmed by KOHUT  MD, STEPHEN ([id number]) on 10/02/2014 7:31:57 AM      MDM  1610  Prior  hospitalization dc summary reviewed.  Pt was treated primarily for a CHF exacerbation.  Dc weight was 171.  Echo showed mitral regurg, normal systolic function.  She was given lasix.  Diuresed a couple of liters of fluid.  Sx improved.  Exam, CXR and labs are consistent with CHF exacerbation.  Weight increased.  Give dose of iv lasix.    New vertebral compression fx.  Pt did not mention any back pain.  Incidental finding.  Final diagnoses:  Acute congestive heart failure, unspecified congestive heart failure type  Thoracic compression fracture, closed, initial encounter    Plan on consultation with medical service for admission.     Linwood Dibbles, MD 10/02/14 [phone]  Discussed the findings with the family. They said she did have a fall about a month ago. She has been having complaints of back pain although has not mentioned any today.  Linwood Dibbles, MD 10/02/14 [phone]

## 2014-10-02 NOTE — ED Notes (Signed)
Patient transported to X-ray 

## 2014-10-02 NOTE — ED Notes (Signed)
MD at bedside. 

## 2014-10-02 NOTE — Progress Notes (Signed)
PT Cancellation Note  Patient Details Name: Yvonne [name] MRN: [id number] DOB: Mar 06, 1919   Cancelled Treatment:    Reason Eval/Treat Not Completed: Fatigue/lethargy limiting ability to participate (pt states"I am exhausted", POA states plans to return to ALF at [facility].)return  7/7 for Evaluation.   Yvonne HayHill, Yvonne [name] 10/02/2014, 4:00 PM Yvonne KelchKaren [name] PT [phone]

## 2014-10-02 NOTE — ED Notes (Signed)
Pt c/o SOB onset last night, states that last week she was hospitalized for "fluid around her heart," pt denies CHF, edema present in bilateral lower extremities. COPD listed in pt's history, but pt and family deny COPD. O2 is 81% on room air, breathing mildly labored.

## 2014-10-02 NOTE — Progress Notes (Signed)
Reviewed daily weights, HF packet, Lasix and importance of PCP follow up at discharge with patient and family/friends. Patient unable to teach back d/t confusion. Family/friends present Harriett Sine(Nancy) able to teach back information and states they will request staff at assisted living start performing daily weights.  Earnest ConroyBrooke M. Clelia CroftShaw, RN

## 2014-10-02 NOTE — Care Management Note (Signed)
Case Management Note  Patient Details  Name: Yvonne [name] MRN: [id number] Date of Birth: 1918/05/18  Subjective/Objective:    Pt admitted with SOB, CHF.                Action/Plan:  From ALF   Expected Discharge Date:   (UNKNOWN)               Expected Discharge Plan:  Assisted Living / Rest Home  In-House Referral:  Clinical Social Work  Discharge planning Services  CM Consult  Post Acute Care Choice:    Choice offered to:     DME Arranged:    DME Agency:     HH Arranged:    HH Agency:     Status of Service:     Medicare Important Message Given:    Date Medicare IM Given:    Medicare IM give by:    Date Additional Medicare IM Given:    Additional Medicare Important Message give by:     If discussed at Long Length of Stay Meetings, dates discussed:    Additional CommentsGeni Bers:  Carrington Mullenax, RN 10/02/2014, 1:14 PM

## 2014-10-02 NOTE — H&P (Signed)
[facility] History and Physical  [name] ZOX:[id number] DOB: Jul 16, 1918 DOA: 10/02/2014  Referring physician: Dr. Lynelle Doctor PCP: Gaye Alken, MD   Chief Complaint: SOB  HPI: NIASHA DEVINS is a 81 y.o. female  Who presents to the ED with complaints of shortness of breath. The patient is demented and much of the history is obtained by the family members and the ED physician. Patient is unable to provide valuable history. Reportedly patient was in the last couple of days has become more short of breath which is worse with activity. This has been a persistent problem has gradually gotten worse as such patient presented to the ED for further evaluation recommendations   Review of Systems:  Unable to accurately assess due to presumed dementia  Past Medical History  Diagnosis Date  . Hypertension   . Hypercholesteremia   . ARF (acute renal failure)   . TIA (transient ischemic attack)    Past Surgical History  Procedure Laterality Date  . Back surgery    . Orif wrist fracture Left 06/12/2014  . Orif wrist fracture Left 06/12/2014    Procedure: OPEN REDUCTION INTERNAL FIXATION (ORIF) WRIST FRACTURE;  Surgeon: Bradly Bienenstock, MD;  Location: [facility] OR;  Service: Orthopedics;  Laterality: Left;   Social History:  reports that she quit smoking about 31 years ago. She has never used smokeless tobacco. She reports that she does not drink alcohol or use illicit drugs.  No Known Allergies  Family History  Problem Relation Age of Onset  . Hypertension Mother   . Hypertension Father     Prior to Admission medications   Medication Sig Start Date End Date Taking? Authorizing Provider  acetaminophen (TYLENOL) 325 MG tablet Take 2 tablets (650 mg total) by mouth every 6 (six) hours as needed for fever, headache, mild pain or moderate pain. Patient taking differently: Take 650 mg by mouth 2 (two) times daily as needed for fever, headache, mild pain or moderate pain.  06/14/14   Yes Nonie Hoyer, PA-C  albuterol (PROVENTIL HFA;VENTOLIN HFA) 108 (90 BASE) MCG/ACT inhaler Inhale 2 puffs into the lungs every 6 (six) hours as needed for wheezing or shortness of breath. 04/09/13  Yes Catarina Hartshorn, MD  atorvastatin (LIPITOR) 20 MG tablet Take 1 tablet (20 mg total) by mouth daily at 6 PM. 08/07/14  Yes Belkys A Regalado, MD  Calcium Carbonate-Vitamin D (CALCIUM + D PO) Take 1 tablet by mouth daily.   Yes Historical Provider, MD  clopidogrel (PLAVIX) 75 MG tablet Take 1 tablet (75 mg total) by mouth daily with breakfast. 04/09/13  Yes Catarina Hartshorn, MD  docusate sodium (COLACE) 100 MG capsule Take 1 capsule (100 mg total) by mouth 2 (two) times daily as needed for mild constipation. 06/14/14  Yes Nonie Hoyer, PA-C  Fluticasone-Salmeterol (ADVAIR DISKUS) 250-50 MCG/DOSE AEPB Inhale 1 puff into the lungs 2 (two) times daily. 08/30/14  Yes Ripudeep Jenna Luo, MD  furosemide (LASIX) 20 MG tablet Take 1 tablet (20 mg total) by mouth every other day. Patient taking differently: Take 20 mg by mouth daily.  08/31/14  Yes Ripudeep Jenna Luo, MD  magnesium hydroxide (MILK OF MAGNESIA) 400 MG/5ML suspension Take 30 mLs by mouth daily as needed for mild constipation.   Yes Historical Provider, MD  metoprolol tartrate (LOPRESSOR) 25 MG tablet Take 1 tablet (25 mg total) by mouth 2 (two) times daily. 08/07/14  Yes Belkys A Regalado, MD  Multiple Vitamins-Minerals (MULTIVITAMINS THER. W/MINERALS) TABS tablet Take 1  tablet by mouth daily.   Yes Historical Provider, MD  ondansetron (ZOFRAN) 4 MG tablet Take 4 mg by mouth every 6 (six) hours as needed for nausea or vomiting.   Yes Historical Provider, MD  polyethylene glycol (MIRALAX / GLYCOLAX) packet Take 17 g by mouth daily as needed. Patient taking differently: Take 17 g by mouth daily as needed for mild constipation.  06/14/14  Yes Nonie HoyerMegan N Baird, PA-C  sertraline (ZOLOFT) 50 MG tablet Take 50 mg by mouth daily.    Yes Historical Provider, MD   Physical  Exam: Filed Vitals:   10/02/14 0729 10/02/14 0730 10/02/14 0800 10/02/14 0954  BP:    167/99  Pulse:  84  81  Temp: 97.7 F (36.5 C)   97.8 F (36.6 C)  TempSrc:    Oral  Resp:  22    Weight:   80.9 kg (178 lb 5.6 oz)   SpO2: 95% 95%  94%    Wt Readings from Last 3 Encounters:  10/02/14 80.9 kg (178 lb 5.6 oz)  08/30/14 78 kg (171 lb 15.3 oz)  08/05/14 78.472 kg (173 lb)    General:  Appears calm and comfortable Eyes: PERRL, normal lids, irises & conjunctiva ENT: grossly normal hearing, lips & tongue Neck: no LAD, masses or thyromegaly Cardiovascular: RRR, no m/r/g.+LE edema. Respiratory: rhales at bases BL, no wheezes, equal chest rise. Abdomen: soft, nt, nd Skin: no rash or induration seen on limited exam Musculoskeletal: grossly normal tone BUE/BLE Psychiatric: grossly normal mood and affect, speech fluent and appropriate Neurologic: no facial asymmetry, answers questions appropriately.          Labs on Admission:  Basic Metabolic Panel:  Recent Labs Lab 10/02/14 0753  NA 136  K 3.7  CL 100*  CO2 25  GLUCOSE 138*  BUN 28*  CREATININE 1.01*  CALCIUM 9.0   Liver Function Tests: No results for input(s): AST, ALT, ALKPHOS, BILITOT, PROT, ALBUMIN in the last 168 hours. No results for input(s): LIPASE, AMYLASE in the last 168 hours. No results for input(s): AMMONIA in the last 168 hours. CBC:  Recent Labs Lab 10/02/14 0753  WBC 7.7  HGB 11.8*  HCT 36.3  MCV 94.5  PLT 187   Cardiac Enzymes: No results for input(s): CKTOTAL, CKMB, CKMBINDEX, TROPONINI in the last 168 hours.  BNP (last 3 results)  Recent Labs  08/28/14 1111 10/02/14 0753  BNP 734.9* 704.7*    ProBNP (last 3 results) No results for input(s): PROBNP in the last 8760 hours.  CBG: No results for input(s): GLUCAP in the last 168 hours.  Radiological Exams on Admission: Dg Chest 2 View  10/02/2014   CLINICAL DATA:  New onset shortness of breath last night.  EXAM: CHEST  2 VIEW   COMPARISON:  08/28/2014  FINDINGS: Cardiac silhouette remains enlarged. Tortuosity and calcification are again noted involving the thoracic aorta. Pulmonary vascular congestion has increased with mild interstitial opacities throughout both lungs. More confluent opacities in both lung bases may represent atelectasis. There may be small bilateral pleural effusions. Lungs overall remain mildly hyperinflated. There is a new severe mid thoracic vertebral body compression fracture, approximately T7. Multiple old right-sided rib fractures are again noted.  IMPRESSION: 1. Increased pulmonary vascular congestion and interstitial opacities suggestive of mild edema. 2. Bibasilar opacities may represent atelectasis. 3. New severe mid thoracic vertebral compression fracture.   Electronically Signed   By: Sebastian AcheAllen  Grady   On: 10/02/2014 08:07    EKG: Independently reviewed. Sinus  rhythm with no ST elevations or depressions.  Assessment/Plan Active Problems:    Acute diastolic CHF (congestive heart failure) - Given history of CKD will hold ACE Inhibitor - Lasix 40 mg IV daily - Telemetry monitoring - Pt had recent Echocardiogram last month showing normal EF  Copd  - continue home medication regimen.  - stable  HTN - Continue B blocker next am.   Code Status: presumed full DVT Prophylaxis: heparin Family Communication: d/c with patient and family at bedside Disposition Plan: Pending improvement in condition.  Time spent: > 55 minutes  Penny Pia [facility] Pager [phone]

## 2014-10-03 LAB — BASIC METABOLIC PANEL
Anion gap: 10 (ref 5–15)
BUN: 27 mg/dL — ABNORMAL HIGH (ref 6–20)
CO2: 27 mmol/L (ref 22–32)
Calcium: 8.7 mg/dL — ABNORMAL LOW (ref 8.9–10.3)
Chloride: 100 mmol/L — ABNORMAL LOW (ref 101–111)
Creatinine, Ser: 1.15 mg/dL — ABNORMAL HIGH (ref 0.44–1.00)
GFR, EST AFRICAN AMERICAN: 46 mL/min — AB (ref >60)
GFR, EST NON AFRICAN AMERICAN: 39 mL/min — AB (ref >60)
GLUCOSE: 125 mg/dL — AB (ref 65–99)
Potassium: 3.5 mmol/L (ref 3.5–5.1)
Sodium: 137 mmol/L (ref 135–145)

## 2014-10-03 MED ORDER — MORPHINE SULFATE 2 MG/ML IJ SOLN
2.0000 mg | Freq: Once | INTRAMUSCULAR | Status: AC
  Administered 2014-10-03: 2 mg via INTRAVENOUS
  Filled 2014-10-03: qty 1

## 2014-10-03 MED ORDER — METOPROLOL TARTRATE 37.5 MG PO TABS: 37.5000 mg | ORAL_TABLET | Freq: Two times a day (BID) | ORAL | Status: DC

## 2014-10-03 NOTE — Evaluation (Signed)
Physical Therapy One Time Evaluation Patient Details Name: DAYANNARA PASCAL MRN: [id number] DOB: 09-01-1918 Today's Date: 10/03/2014   History of Present Illness  81 yo female admitted for Acute diastolic CHF. Hx of TIA, HTN, L distal radius/ulna fx-ORIF 05/2014, rib fx, falls. Pt is from ALF.  Clinical Impression  Patient evaluated by Physical Therapy with no further acute PT needs identified. All education has been completed and the patient has no further questions.  Pt mobilizing well with RW; pt appears at baseline.  No further follow-up Physical Therapy or equipment needs. PT is signing off. Thank you for this referral.     Follow Up Recommendations No PT follow up    Equipment Recommendations  None recommended by PT    Recommendations for Other Services       Precautions / Restrictions Precautions Precautions: Fall Restrictions Weight Bearing Restrictions: No      Mobility  Bed Mobility               General bed mobility comments: up in recliner on arrival  Transfers Overall transfer level: Needs assistance Equipment used: Rolling walker (2 wheeled) Transfers: Sit to/from Stand Sit to Stand: Supervision            Ambulation/Gait Ambulation/Gait assistance: Supervision Ambulation Distance (Feet): 240 Feet Assistive device: Rolling walker (2 wheeled) Gait Pattern/deviations: WFL(Within Functional Limits)     General Gait Details: good use of RW, no unsteadiness observed, denies any symptoms  Stairs            Wheelchair Mobility    Modified Rankin (Stroke Patients Only)       Balance Overall balance assessment: History of Falls                                           Pertinent Vitals/Pain Pain Assessment: No/denies pain    Home Living Family/patient expects to be discharged to:: Assisted living               Home Equipment: Walker - 2 wheels      Prior Function Level of Independence: Independent with  assistive device(s)         Comments: uses RW for ambulation     Hand Dominance        Extremity/Trunk Assessment   Upper Extremity Assessment: Overall WFL for tasks assessed           Lower Extremity Assessment: Overall WFL for tasks assessed         Communication   Communication: HOH  Cognition Arousal/Alertness: Awake/alert Behavior During Therapy: WFL for tasks assessed/performed Overall Cognitive Status: History of cognitive impairments - at baseline                      General Comments      Exercises        Assessment/Plan    PT Assessment Patent does not need any further PT services  PT Diagnosis     PT Problem List    PT Treatment Interventions     PT Goals (Current goals can be found in the Care Plan section) Acute Rehab PT Goals PT Goal Formulation: All assessment and education complete, DC therapy    Frequency     Barriers to discharge        Co-evaluation  End of Session   Activity Tolerance: Patient tolerated treatment well Patient left: in chair;with call bell/phone within reach;with chair alarm set           Time: 0912-0924 PT Time Calculation (min) (ACUTE ONLY): 12 min   Charges:   PT Evaluation $Initial PT Evaluation Tier I: 1 Procedure     PT G Codes:        Angas Isabell,KATHrine E 10/03/2014, 11:47 AM Zenovia JarredKati Jillian Warth, PT, DPT 10/03/2014 Pager: [phone]

## 2014-10-03 NOTE — Clinical Social Work Note (Signed)
Clinical Social Work Assessment  Patient Details  Name: Yvonne [name] MRN: [id number] Date of Birth: [birth year]  Date of referral:  10/03/14               Reason for consult:  Facility Placement                Permission sought to share information with:  Facility Industrial/product designerContact Representative Permission granted to share information::  Yes, Verbal Permission Granted  Name::        Agency::     Relationship::     Contact Information:     Housing/Transportation Living arrangements for the past 2 months:  Assisted Living Facility Source of Information:  Patient Patient Interpreter Needed:  None Criminal Activity/Legal Involvement Pertinent to Current Situation/Hospitalization:  No - Comment as needed Significant Relationships:  Friend Lives with:  Facility Resident Do you feel safe going back to the place where you live?  Yes Need for family participation in patient care:  No (Coment)  Care giving concerns:  CSW received consult that patient was admitted from [facility].    Social Worker assessment / plan:  CSW spoke with patient to confirm that she plans to return to [facility] at discharge.   Employment status:  Retired Database administratornsurance information:  Managed Medicare PT Recommendations:  No Follow Up Information / Referral to community resources:     Patient/Family's Response to care:  Patient informed CSW that she has been pleased with [facility] and looks forward to returning.   Patient/Family's Understanding of and Emotional Response to Diagnosis, Current Treatment, and Prognosis:  Patient is very anxious about returning to [facility], keeps asking "why am I here?".   Emotional Assessment Appearance:  Appears stated age Attitude/Demeanor/Rapport:    Affect (typically observed):  Anxious, Pleasant Orientation:  Oriented to Self, Oriented to Place, Oriented to  Time, Oriented to Situation Alcohol / Substance use:    Psych involvement (Current and /or in the  community):     Discharge Needs  Concerns to be addressed:    Readmission within the last 30 days:    Current discharge risk:    Barriers to Discharge:      Arlyss RepressHarrison, Shalece Staffa F, LCSW 10/03/2014, 1:16 PM

## 2014-10-03 NOTE — Discharge Summary (Signed)
Physician Discharge Summary  Yvonne DrossVirginia K Dia ZOX:[id number] DOB: Jul 26, 1918 DOA: 10/02/2014  PCP: Gaye AlkenBARNES,ELIZABETH STEWART, MD  Admit date: 10/02/2014 Discharge date: 10/03/2014  Time spent: > 35 minutes  Recommendations for Outpatient Follow-up:  1. Recently increased B blocker for improved BP control, follow BP's and adjust antihypertensive medications accordingly.  Discharge Diagnoses:  Principal Problem:   Acute diastolic CHF (congestive heart failure) Active Problems:   COPD (chronic obstructive pulmonary disease)   HTN (hypertension)   Discharge Condition: stable  Diet recommendation: heart healthy  Filed Weights   10/02/14 0800 10/02/14 1128 10/03/14 0027  Weight: 80.9 kg (178 lb 5.6 oz) 78.6 kg (173 lb 4.5 oz) 78.523 kg (173 lb 1.8 oz)    History of present illness:  From original HPI: 81 y.o. female  Who presents to the ED with complaints of shortness of breath. The patient is demented and much of the history is obtained by the family members and the ED physician. Patient is unable to provide valuable history  Hospital Course:  Acute diastolic CHF - resolved after administration of IV lasix - Patient will go back to SNF on her home regimen - weight daily  Otherwise will continue home medication regimen for known comorbidities. Will increase metoprolol dose given persistently elevated blood pressures  Procedures:  None  Consultations:  None  Discharge Exam: Filed Vitals:   10/03/14 0500  BP: 170/74  Pulse: 100  Temp: 97.9 F (36.6 C)  Resp: 18    General: Pt in nad, alert and awake Cardiovascular: rrr, no mrg Respiratory: cta bl, no wheezes  Discharge Instructions   Discharge Instructions    (HEART FAILURE PATIENTS) Call MD:  Anytime you have any of the following symptoms: 1) 3 pound weight gain in 24 hours or 5 pounds in 1 week 2) shortness of breath, with or without a dry hacking cough 3) swelling in the hands, feet or stomach 4) if you have  to sleep on extra pillows at night in order to breathe.    Complete by:  As directed      Call MD for:  difficulty breathing, headache or visual disturbances    Complete by:  As directed      Call MD for:  temperature >100.4    Complete by:  As directed      Diet - low sodium heart healthy    Complete by:  As directed      Increase activity slowly    Complete by:  As directed           Current Discharge Medication List    CONTINUE these medications which have CHANGED   Details  metoprolol tartrate 37.5 MG TABS Take 37.5 mg by mouth 2 (two) times daily.      CONTINUE these medications which have NOT CHANGED   Details  acetaminophen (TYLENOL) 325 MG tablet Take 2 tablets (650 mg total) by mouth every 6 (six) hours as needed for fever, headache, mild pain or moderate pain.    albuterol (PROVENTIL HFA;VENTOLIN HFA) 108 (90 BASE) MCG/ACT inhaler Inhale 2 puffs into the lungs every 6 (six) hours as needed for wheezing or shortness of breath. Qty: 1 Inhaler, Refills: 1    atorvastatin (LIPITOR) 20 MG tablet Take 1 tablet (20 mg total) by mouth daily at 6 PM. Qty: 30 tablet, Refills: 0    Calcium Carbonate-Vitamin D (CALCIUM + D PO) Take 1 tablet by mouth daily.    clopidogrel (PLAVIX) 75 MG tablet Take 1 tablet (75  mg total) by mouth daily with breakfast. Qty: 30 tablet, Refills: 1    docusate sodium (COLACE) 100 MG capsule Take 1 capsule (100 mg total) by mouth 2 (two) times daily as needed for mild constipation. Qty: 10 capsule, Refills: 0    Fluticasone-Salmeterol (ADVAIR DISKUS) 250-50 MCG/DOSE AEPB Inhale 1 puff into the lungs 2 (two) times daily. Qty: 60 each, Refills: 0    furosemide (LASIX) 20 MG tablet Take 1 tablet (20 mg total) by mouth every other day. Qty: 30 tablet, Refills: 3    magnesium hydroxide (MILK OF MAGNESIA) 400 MG/5ML suspension Take 30 mLs by mouth daily as needed for mild constipation.    Multiple Vitamins-Minerals (MULTIVITAMINS THER. W/MINERALS)  TABS tablet Take 1 tablet by mouth daily.    ondansetron (ZOFRAN) 4 MG tablet Take 4 mg by mouth every 6 (six) hours as needed for nausea or vomiting.    polyethylene glycol (MIRALAX / GLYCOLAX) packet Take 17 g by mouth daily as needed. Qty: 14 each, Refills: 0    sertraline (ZOLOFT) 50 MG tablet Take 50 mg by mouth daily.        No Known Allergies    The results of significant diagnostics from this hospitalization (including imaging, microbiology, ancillary and laboratory) are listed below for reference.    Significant Diagnostic Studies: Dg Chest 2 View  10/02/2014   CLINICAL DATA:  New onset shortness of breath last night.  EXAM: CHEST  2 VIEW  COMPARISON:  08/28/2014  FINDINGS: Cardiac silhouette remains enlarged. Tortuosity and calcification are again noted involving the thoracic aorta. Pulmonary vascular congestion has increased with mild interstitial opacities throughout both lungs. More confluent opacities in both lung bases may represent atelectasis. There may be small bilateral pleural effusions. Lungs overall remain mildly hyperinflated. There is a new severe mid thoracic vertebral body compression fracture, approximately T7. Multiple old right-sided rib fractures are again noted.  IMPRESSION: 1. Increased pulmonary vascular congestion and interstitial opacities suggestive of mild edema. 2. Bibasilar opacities may represent atelectasis. 3. New severe mid thoracic vertebral compression fracture.   Electronically Signed   By: Sebastian Ache   On: 10/02/2014 08:07    Microbiology: No results found for this or any previous visit (from the past 240 hour(s)).   Labs: Basic Metabolic Panel:  Recent Labs Lab 10/02/14 0753 10/03/14 0441  NA 136 137  K 3.7 3.5  CL 100* 100*  CO2 25 27  GLUCOSE 138* 125*  BUN 28* 27*  CREATININE 1.01* 1.15*  CALCIUM 9.0 8.7*   Liver Function Tests: No results for input(s): AST, ALT, ALKPHOS, BILITOT, PROT, ALBUMIN in the last 168 hours. No  results for input(s): LIPASE, AMYLASE in the last 168 hours. No results for input(s): AMMONIA in the last 168 hours. CBC:  Recent Labs Lab 10/02/14 0753  WBC 7.7  HGB 11.8*  HCT 36.3  MCV 94.5  PLT 187   Cardiac Enzymes: No results for input(s): CKTOTAL, CKMB, CKMBINDEX, TROPONINI in the last 168 hours. BNP: BNP (last 3 results)  Recent Labs  08/28/14 1111 10/02/14 0753  BNP 734.9* 704.7*    ProBNP (last 3 results) No results for input(s): PROBNP in the last 8760 hours.  CBG: No results for input(s): GLUCAP in the last 168 hours.   Signed:  Penny Pia  [facility] 10/03/2014, 3:15 PM

## 2015-01-24 ENCOUNTER — Emergency Department (HOSPITAL_COMMUNITY): Payer: Medicare Other

## 2015-01-24 ENCOUNTER — Encounter (HOSPITAL_COMMUNITY): Payer: Self-pay | Admitting: *Deleted

## 2015-01-24 ENCOUNTER — Observation Stay (HOSPITAL_COMMUNITY)
Admission: EM | Admit: 2015-01-24 | Discharge: 2015-01-27 | Disposition: A | Discharge status: Nursing Home (Includes ICF) | Payer: Medicare Other | Attending: Internal Medicine | Admitting: Internal Medicine

## 2015-01-24 DIAGNOSIS — M25531 Pain in right wrist: Secondary | ICD-10-CM | POA: Insufficient documentation

## 2015-01-24 DIAGNOSIS — J9601 Acute respiratory failure with hypoxia: Secondary | ICD-10-CM | POA: Diagnosis not present

## 2015-01-24 DIAGNOSIS — Z79899 Other long term (current) drug therapy: Secondary | ICD-10-CM | POA: Insufficient documentation

## 2015-01-24 DIAGNOSIS — I509 Heart failure, unspecified: Secondary | ICD-10-CM | POA: Diagnosis not present

## 2015-01-24 DIAGNOSIS — E876 Hypokalemia: Secondary | ICD-10-CM | POA: Diagnosis not present

## 2015-01-24 DIAGNOSIS — I160 Hypertensive urgency: Secondary | ICD-10-CM | POA: Diagnosis not present

## 2015-01-24 DIAGNOSIS — I130 Hypertensive heart and chronic kidney disease with heart failure and stage 1 through stage 4 chronic kidney disease, or unspecified chronic kidney disease: Secondary | ICD-10-CM | POA: Diagnosis not present

## 2015-01-24 DIAGNOSIS — E7800 Pure hypercholesterolemia, unspecified: Secondary | ICD-10-CM | POA: Insufficient documentation

## 2015-01-24 DIAGNOSIS — I5033 Acute on chronic diastolic (congestive) heart failure: Secondary | ICD-10-CM | POA: Diagnosis present

## 2015-01-24 DIAGNOSIS — J449 Chronic obstructive pulmonary disease, unspecified: Secondary | ICD-10-CM | POA: Diagnosis not present

## 2015-01-24 DIAGNOSIS — N183 Chronic kidney disease, stage 3 unspecified: Secondary | ICD-10-CM | POA: Diagnosis present

## 2015-01-24 DIAGNOSIS — Z8673 Personal history of transient ischemic attack (TIA), and cerebral infarction without residual deficits: Secondary | ICD-10-CM | POA: Diagnosis not present

## 2015-01-24 DIAGNOSIS — Z87891 Personal history of nicotine dependence: Secondary | ICD-10-CM | POA: Diagnosis not present

## 2015-01-24 DIAGNOSIS — F0390 Unspecified dementia without behavioral disturbance: Secondary | ICD-10-CM | POA: Insufficient documentation

## 2015-01-24 DIAGNOSIS — R0600 Dyspnea, unspecified: Secondary | ICD-10-CM | POA: Diagnosis present

## 2015-01-24 LAB — BLOOD GAS, VENOUS
Acid-Base Excess: 1.2 mmol/L (ref 0.0–2.0)
Bicarbonate: 26.4 mEq/L — ABNORMAL HIGH (ref 20.0–24.0)
Drawn by: 430061
O2 Content: 4 L/min
O2 SAT: 79.7 %
PCO2 VEN: 45.2 mmHg (ref 45.0–50.0)
PO2 VEN: 47.9 mmHg — AB (ref 30.0–45.0)
Patient temperature: 97.4
TCO2: 24.1 mmol/L (ref 0–100)
pH, Ven: 7.38 — ABNORMAL HIGH (ref 7.250–7.300)

## 2015-01-24 LAB — CBC
HEMATOCRIT: 39.5 % (ref 36.0–46.0)
HEMOGLOBIN: 12.9 g/dL (ref 12.0–15.0)
MCH: 29.6 pg (ref 26.0–34.0)
MCHC: 32.7 g/dL (ref 30.0–36.0)
MCV: 90.6 fL (ref 78.0–100.0)
Platelets: 169 10*3/uL (ref 150–400)
RBC: 4.36 MIL/uL (ref 3.87–5.11)
RDW: 14.4 % (ref 11.5–15.5)
WBC: 9 10*3/uL (ref 4.0–10.5)

## 2015-01-24 LAB — PROTIME-INR
INR: 1.14 (ref 0.00–1.49)
Prothrombin Time: 14.8 seconds (ref 11.6–15.2)

## 2015-01-24 LAB — CBC WITH DIFFERENTIAL/PLATELET
BASOS ABS: 0 10*3/uL (ref 0.0–0.1)
Basophils Relative: 0 %
Eosinophils Absolute: 0 10*3/uL (ref 0.0–0.7)
Eosinophils Relative: 0 %
HEMATOCRIT: 36.6 % (ref 36.0–46.0)
HEMOGLOBIN: 12 g/dL (ref 12.0–15.0)
Lymphocytes Relative: 6 %
Lymphs Abs: 0.4 10*3/uL — ABNORMAL LOW (ref 0.7–4.0)
MCH: 29.8 pg (ref 26.0–34.0)
MCHC: 32.8 g/dL (ref 30.0–36.0)
MCV: 90.8 fL (ref 78.0–100.0)
MONOS PCT: 6 %
Monocytes Absolute: 0.5 10*3/uL (ref 0.1–1.0)
NEUTROS ABS: 6.9 10*3/uL (ref 1.7–7.7)
NEUTROS PCT: 88 %
Platelets: 159 10*3/uL (ref 150–400)
RBC: 4.03 MIL/uL (ref 3.87–5.11)
RDW: 14.3 % (ref 11.5–15.5)
WBC: 7.8 10*3/uL (ref 4.0–10.5)

## 2015-01-24 LAB — COMPREHENSIVE METABOLIC PANEL
ALK PHOS: 47 U/L (ref 38–126)
ALT: 22 U/L (ref 14–54)
AST: 27 U/L (ref 15–41)
Albumin: 4 g/dL (ref 3.5–5.0)
Anion gap: 10 (ref 5–15)
BILIRUBIN TOTAL: 1.7 mg/dL — AB (ref 0.3–1.2)
BUN: 26 mg/dL — AB (ref 6–20)
CO2: 26 mmol/L (ref 22–32)
CREATININE: 1.06 mg/dL — AB (ref 0.44–1.00)
Calcium: 9.1 mg/dL (ref 8.9–10.3)
Chloride: 101 mmol/L (ref 101–111)
GFR calc Af Amer: 50 mL/min — ABNORMAL LOW (ref >60)
GFR calc non Af Amer: 44 mL/min — ABNORMAL LOW (ref >60)
Glucose, Bld: 161 mg/dL — ABNORMAL HIGH (ref 65–99)
Potassium: 3.3 mmol/L — ABNORMAL LOW (ref 3.5–5.1)
Sodium: 137 mmol/L (ref 135–145)
TOTAL PROTEIN: 7.7 g/dL (ref 6.5–8.1)

## 2015-01-24 LAB — I-STAT TROPONIN, ED: Troponin i, poc: 0 ng/mL (ref 0.00–0.08)

## 2015-01-24 LAB — URINALYSIS, ROUTINE W REFLEX MICROSCOPIC
BILIRUBIN URINE: NEGATIVE
Glucose, UA: NEGATIVE mg/dL
HGB URINE DIPSTICK: NEGATIVE
Ketones, ur: NEGATIVE mg/dL
Leukocytes, UA: NEGATIVE
NITRITE: NEGATIVE
PROTEIN: NEGATIVE mg/dL
SPECIFIC GRAVITY, URINE: 1.005 (ref 1.005–1.030)
UROBILINOGEN UA: 0.2 mg/dL (ref 0.0–1.0)
pH: 7 (ref 5.0–8.0)

## 2015-01-24 LAB — BRAIN NATRIURETIC PEPTIDE
B NATRIURETIC PEPTIDE 5: 1013.9 pg/mL — AB (ref 0.0–100.0)
B Natriuretic Peptide: 862.9 pg/mL — ABNORMAL HIGH (ref 0.0–100.0)

## 2015-01-24 LAB — MRSA PCR SCREENING: MRSA BY PCR: NEGATIVE

## 2015-01-24 LAB — CREATININE, SERUM
Creatinine, Ser: 1.02 mg/dL — ABNORMAL HIGH (ref 0.44–1.00)
GFR, EST AFRICAN AMERICAN: 53 mL/min — AB (ref >60)
GFR, EST NON AFRICAN AMERICAN: 46 mL/min — AB (ref >60)

## 2015-01-24 MED ORDER — ONDANSETRON HCL 4 MG PO TABS: 4.0000 mg | ORAL_TABLET | Freq: Four times a day (QID) | ORAL | Status: DC | PRN

## 2015-01-24 MED ORDER — HYDRALAZINE HCL 10 MG PO TABS
10.0000 mg | ORAL_TABLET | Freq: Three times a day (TID) | ORAL | Status: DC
  Administered 2015-01-24 – 2015-01-27 (×9): 10 mg via ORAL
  Filled 2015-01-24 (×9): qty 1

## 2015-01-24 MED ORDER — MAGNESIUM HYDROXIDE 400 MG/5ML PO SUSP: 30.0000 mL | Freq: Every day | ORAL | Status: DC | PRN

## 2015-01-24 MED ORDER — ATORVASTATIN CALCIUM 10 MG PO TABS
20.0000 mg | ORAL_TABLET | Freq: Every day | ORAL | Status: DC
  Administered 2015-01-24 – 2015-01-26 (×3): 20 mg via ORAL
  Filled 2015-01-24 (×3): qty 2

## 2015-01-24 MED ORDER — ALBUTEROL SULFATE (2.5 MG/3ML) 0.083% IN NEBU
2.5000 mg | INHALATION_SOLUTION | Freq: Four times a day (QID) | RESPIRATORY_TRACT | Status: DC
  Administered 2015-01-24 (×2): 2.5 mg via RESPIRATORY_TRACT
  Filled 2015-01-24 (×2): qty 3

## 2015-01-24 MED ORDER — SERTRALINE HCL 50 MG PO TABS
50.0000 mg | ORAL_TABLET | Freq: Every day | ORAL | Status: DC
  Administered 2015-01-24 – 2015-01-27 (×4): 50 mg via ORAL
  Filled 2015-01-24 (×4): qty 1

## 2015-01-24 MED ORDER — FUROSEMIDE 10 MG/ML IJ SOLN
40.0000 mg | Freq: Once | INTRAMUSCULAR | Status: AC
  Administered 2015-01-24: 40 mg via INTRAVENOUS
  Filled 2015-01-24: qty 4

## 2015-01-24 MED ORDER — CLOPIDOGREL BISULFATE 75 MG PO TABS
75.0000 mg | ORAL_TABLET | Freq: Every day | ORAL | Status: DC
  Administered 2015-01-25 – 2015-01-27 (×3): 75 mg via ORAL
  Filled 2015-01-24 (×3): qty 1

## 2015-01-24 MED ORDER — ENOXAPARIN SODIUM 40 MG/0.4ML ~~LOC~~ SOLN
40.0000 mg | Freq: Every day | SUBCUTANEOUS | Status: DC
  Administered 2015-01-25: 40 mg via SUBCUTANEOUS
  Filled 2015-01-24: qty 0.4

## 2015-01-24 MED ORDER — ALBUTEROL SULFATE (2.5 MG/3ML) 0.083% IN NEBU: 2.5000 mg | INHALATION_SOLUTION | RESPIRATORY_TRACT | Status: DC | PRN

## 2015-01-24 MED ORDER — HYDRALAZINE HCL 20 MG/ML IJ SOLN
10.0000 mg | Freq: Once | INTRAMUSCULAR | Status: AC
  Administered 2015-01-24: 10 mg via INTRAVENOUS
  Filled 2015-01-24: qty 1

## 2015-01-24 MED ORDER — METOPROLOL TARTRATE 25 MG PO TABS
37.5000 mg | ORAL_TABLET | Freq: Two times a day (BID) | ORAL | Status: DC
  Administered 2015-01-24 – 2015-01-27 (×7): 37.5 mg via ORAL
  Filled 2015-01-24 (×3): qty 1
  Filled 2015-01-24: qty 2
  Filled 2015-01-24 (×2): qty 1
  Filled 2015-01-24 (×3): qty 2
  Filled 2015-01-24: qty 1

## 2015-01-24 MED ORDER — HYDRALAZINE HCL 20 MG/ML IJ SOLN
10.0000 mg | Freq: Four times a day (QID) | INTRAMUSCULAR | Status: DC | PRN
  Administered 2015-01-24: 10 mg via INTRAVENOUS
  Filled 2015-01-24: qty 1

## 2015-01-24 MED ORDER — LIP MEDEX EX OINT
TOPICAL_OINTMENT | CUTANEOUS | Status: AC
  Administered 2015-01-24: 1
  Filled 2015-01-24: qty 7

## 2015-01-24 MED ORDER — DOCUSATE SODIUM 100 MG PO CAPS
100.0000 mg | ORAL_CAPSULE | Freq: Two times a day (BID) | ORAL | Status: DC | PRN
  Administered 2015-01-25: 100 mg via ORAL
  Filled 2015-01-24: qty 1

## 2015-01-24 MED ORDER — NITROGLYCERIN 0.4 MG SL SUBL
0.4000 mg | SUBLINGUAL_TABLET | Freq: Once | SUBLINGUAL | Status: AC
  Administered 2015-01-24: 0.4 mg via SUBLINGUAL
  Filled 2015-01-24: qty 1

## 2015-01-24 MED ORDER — IPRATROPIUM-ALBUTEROL 0.5-2.5 (3) MG/3ML IN SOLN
3.0000 mL | Freq: Once | RESPIRATORY_TRACT | Status: AC
  Administered 2015-01-24: 3 mL via RESPIRATORY_TRACT
  Filled 2015-01-24: qty 3

## 2015-01-24 MED ORDER — FUROSEMIDE 10 MG/ML IJ SOLN
40.0000 mg | Freq: Every day | INTRAMUSCULAR | Status: DC
Start: 2015-01-24 — End: 2015-01-25
  Administered 2015-01-24: 40 mg via INTRAVENOUS
  Filled 2015-01-24 (×2): qty 4

## 2015-01-24 MED ORDER — MOMETASONE FURO-FORMOTEROL FUM 100-5 MCG/ACT IN AERO
2.0000 | INHALATION_SPRAY | Freq: Two times a day (BID) | RESPIRATORY_TRACT | Status: DC
  Administered 2015-01-24 – 2015-01-27 (×4): 2 via RESPIRATORY_TRACT
  Filled 2015-01-24: qty 8.8

## 2015-01-24 NOTE — ED Notes (Signed)
Attempt to call report x 1  

## 2015-01-24 NOTE — H&P (Addendum)
[facility] History and Physical  [name] JXB:[id number] DOB: 09-Jul-1918 DOA: 01/24/2015  Referring physician: ED physician PCP: Gaye Alken, MD   Chief Complaint: dyspnea   HPI:  Patient is a 81 year old female with past medical history significant for COPD, CHF, hypertension, TIA, dementia, who presented with main concern of several days duration of progressive dyspnea at rest and with exertion. Please note that pt is unable to provide any history at the time of the admission due to dementia and no family available at bedside. Review of records indicate that pt has had multiple admission for CHF. No reported fevers, chills, abd or urinary concerns.   In ED, pt is somnolent but easy to awake, VS notable for BP as high as 225/117. TRH asked to admit for further evaluation. SDU requested.    Assessment and Plan: Active Problems:   Acute hypoxic respiratory failure secondary to acute on chronic diastolic CHF exacerbation  - per last 2 D ECHO in 2016, EF 55% with grade II diastolic CHF - continue with Lasix that was started in ED - weight is 75 kg on admission - continue to monitor daily weights strict I/O   Active Problems:   Hypertensive urgency - place on hydralazine 10 mg PO TID - continue home medical regimen with metoprolol - add hydralazine as needed     Hypokalemia - supplement and repeat BMP in AM    CKD (chronic kidney disease) stage 3, GFR 30-59 ml/min - Cr is at baseline - CBC in AM    Dementia - not sure how close to baseline - will need to re evaluate in AM  DVT prophylaxis - Lovenox SQ   Radiological Exams on Admission: Dg Chest 2 View  01/24/2015  CLINICAL DATA:  Acute onset of shortness of breath. Initial encounter. EXAM: CHEST  2 VIEW COMPARISON:  Chest radiograph performed 10/02/2014 FINDINGS: Vascular congestion is noted, with small bilateral pleural effusions and central airspace opacities, compatible with pulmonary edema.  No pneumothorax is seen. The cardiomediastinal silhouette is borderline enlarged. No acute osseous abnormalities are identified. IMPRESSION: Vascular congestion and borderline cardiomegaly, with small bilateral pleural effusions and central airspace opacities, compatible with pulmonary edema. Electronically Signed   By: Roanna Raider M.D.   On: 01/24/2015 05:29   Dg Wrist Complete Right  01/24/2015  CLINICAL DATA:  Status post fall, with radial wrist tenderness. Initial encounter. EXAM: RIGHT WRIST - COMPLETE 3+ VIEW COMPARISON:  None. FINDINGS: There is no evidence of fracture or dislocation. The carpal rows are intact, and demonstrate normal alignment. Minimal degenerative change is noted at the radial aspect of the carpal rows. The joint spaces are preserved. No significant soft tissue abnormalities are seen. IMPRESSION: No evidence of fracture or dislocation. Electronically Signed   By: Roanna Raider M.D.   On: 01/24/2015 05:31     Code Status: Full Family Communication: No family at bedside  Disposition Plan: Admit for further evaluation    Danie Binder [facility] [phone]   Review of Systems:  Unable to obtain due to dementia    Past Medical History  Diagnosis Date  . Hypertension   . Hypercholesteremia   . ARF (acute renal failure) (HCC)   . TIA (transient ischemic attack)     Past Surgical History  Procedure Laterality Date  . Back surgery    . Orif wrist fracture Left 06/12/2014  . Orif wrist fracture Left 06/12/2014    Procedure: OPEN REDUCTION INTERNAL FIXATION (ORIF) WRIST FRACTURE;  Surgeon: Bradly Bienenstock,  MD;  Location: [facility] OR;  Service: Orthopedics;  Laterality: Left;    Social History:  reports that she quit smoking about 31 years ago. She has never used smokeless tobacco. She reports that she does not drink alcohol or use illicit drugs.  No Known Allergies  Family History  Problem Relation Age of Onset  . Hypertension Mother   . Hypertension Father     Prior to  Admission medications   Medication Sig Start Date End Date Taking? Authorizing Provider  acetaminophen (TYLENOL) 325 MG tablet Take 2 tablets (650 mg total) by mouth every 6 (six) hours as needed for fever, headache, mild pain or moderate pain. Patient taking differently: Take 650 mg by mouth 2 (two) times daily as needed for fever, headache, mild pain or moderate pain.  06/14/14  Yes Nonie HoyerMegan N Baird, PA-C  albuterol (PROVENTIL HFA;VENTOLIN HFA) 108 (90 BASE) MCG/ACT inhaler Inhale 2 puffs into the lungs every 6 (six) hours as needed for wheezing or shortness of breath. 04/09/13  Yes Catarina Hartshornavid Tat, MD  atorvastatin (LIPITOR) 20 MG tablet Take 1 tablet (20 mg total) by mouth daily at 6 PM. 08/07/14  Yes Belkys A Regalado, MD  Calcium Carbonate-Vitamin D (CALCIUM + D PO) Take 1 tablet by mouth daily.   Yes Historical Provider, MD  clopidogrel (PLAVIX) 75 MG tablet Take 1 tablet (75 mg total) by mouth daily with breakfast. 04/09/13  Yes Catarina Hartshornavid Tat, MD  docusate sodium (COLACE) 100 MG capsule Take 1 capsule (100 mg total) by mouth 2 (two) times daily as needed for mild constipation. 06/14/14  Yes Nonie HoyerMegan N Baird, PA-C  Fluticasone-Salmeterol (ADVAIR DISKUS) 250-50 MCG/DOSE AEPB Inhale 1 puff into the lungs 2 (two) times daily. 08/30/14  Yes Ripudeep Jenna LuoK Rai, MD  furosemide (LASIX) 20 MG tablet Take 1 tablet (20 mg total) by mouth every other day. Patient taking differently: Take 20 mg by mouth daily.  08/31/14  Yes Ripudeep Jenna LuoK Rai, MD  magnesium hydroxide (MILK OF MAGNESIA) 400 MG/5ML suspension Take 30 mLs by mouth daily as needed for mild constipation.   Yes Historical Provider, MD  metoprolol tartrate 37.5 MG TABS Take 37.5 mg by mouth 2 (two) times daily. 10/03/14  Yes Penny Piarlando Vega, MD  Multiple Vitamins-Minerals (MULTIVITAMINS THER. W/MINERALS) TABS tablet Take 1 tablet by mouth daily.   Yes Historical Provider, MD  ondansetron (ZOFRAN) 4 MG tablet Take 4 mg by mouth every 6 (six) hours as needed for nausea or vomiting.    Yes Historical Provider, MD  polyethylene glycol (MIRALAX / GLYCOLAX) packet Take 17 g by mouth daily as needed. Patient taking differently: Take 17 g by mouth daily as needed for mild constipation.  06/14/14  Yes Nonie HoyerMegan N Baird, PA-C  sertraline (ZOLOFT) 50 MG tablet Take 50 mg by mouth daily.    Yes Historical Provider, MD    Physical Exam: Filed Vitals:   01/24/15 0805 01/24/15 0813 01/24/15 0844 01/24/15 0958  BP: 202/87  178/62 160/52  Pulse: 82  66 91  Temp: 97.6 F (36.4 C)     TempSrc: Oral     Resp: 29  27 26   Height:  5\' 5"  (1.651 m)    Weight:  75.4 kg (166 lb 3.6 oz)    SpO2: 99%  96% 97%    Physical Exam  Constitutional: Appears somnolent, NAD  HENT: Normocephalic. External right and left ear normal.  Eyes: Conjunctivae and EOM are normal. PERRLA, no scleral icterus.  Neck: Normal ROM. Neck supple. No JVD.  No tracheal deviation. No thyromegaly.  CVS: RRR, no gallops, no carotid bruit.  Pulmonary: Effort and breath sounds normal, crackles at bases  Abdominal: Soft. BS +,  no distension, tenderness, rebound or guarding.  Musculoskeletal: Normal range of motion.  Lymphadenopathy: No lymphadenopathy noted, cervical, inguinal. Neuro:Somnolent but easy to arouse, moving all 4 extremities spontaneously  Skin: Skin is warm and dry. No rash noted. Not diaphoretic. No erythema. No pallor.  Psychiatric: unable to assess due to dementia   Labs on Admission:  Basic Metabolic Panel:  Recent Labs Lab 01/24/15 0503  NA 137  K 3.3*  CL 101  CO2 26  GLUCOSE 161*  BUN 26*  CREATININE 1.06*  CALCIUM 9.1   Liver Function Tests:  Recent Labs Lab 01/24/15 0503  AST 27  ALT 22  ALKPHOS 47  BILITOT 1.7*  PROT 7.7  ALBUMIN 4.0   CBC:  Recent Labs Lab 01/24/15 0503  WBC 7.8  NEUTROABS 6.9  HGB 12.0  HCT 36.6  MCV 90.8  PLT 159   EKG: pending   If 7PM-7AM, please contact night-coverage [email or web address] Password [email or web address] 01/24/2015, 10:41 AM

## 2015-01-24 NOTE — Care Management Important Message (Signed)
Important Message  Patient Details  Name: Yvonne [name] MRN: [id number] Date of Birth: Aug 23, 1918   Medicare Important Message Given:  Yes-second notification given    Renie OraHawkins, Omaree Fuqua Smith 01/24/2015, 4:01 PMImportant Message  Patient Details  Name: Yvonne DrossVirginia K [name] MRN: [id number] Date of Birth: Aug 23, 1918   Medicare Important Message Given:  Yes-second notification given    Renie OraHawkins, Lorrene Graef Smith 01/24/2015, 4:01 PM

## 2015-01-24 NOTE — ED Notes (Signed)
Bed: WA09 Expected date:  Expected time:  Means of arrival:  Comments: EMS 81 yo with shortness of breath and wheezing/duoneb per EMS

## 2015-01-24 NOTE — Progress Notes (Signed)
Paged MD about BiPap order.

## 2015-01-24 NOTE — ED Notes (Signed)
Report given to Sierra

## 2015-01-24 NOTE — Progress Notes (Signed)
RT in to start patient on BiPAP, however, patient in no distress. RN paged MD to see patient and wants to hold off on BiPAP at this time. Will continue to monitor.

## 2015-01-24 NOTE — ED Notes (Signed)
Patient was a [place] and complained of shortness of breath for the last 2-3 hours. Patient was 88% on 2L initially upon arrival. Patient is now at 96% on 4 Liters.

## 2015-01-24 NOTE — ED Provider Notes (Signed)
CSN: [id number]     Arrival date & time 01/24/15  0411 History   First MD Initiated Contact with Patient 01/24/15 0415     Chief Complaint  Patient presents with  . Shortness of Breath     (Consider location/radiation/quality/duration/timing/severity/associated sxs/prior Treatment) HPI   Patient is a 81 year old female with past medical history significant for COPD, CHF, hypertension, TIA, dementia presenting today with shortness of breath. Patient's shortness of breath started last night. No cough. No fever. No increase in sputum. Patient has dementia and is a poor historian. However patient's friend is at bedside and able to relate the story.  Patient has multiple admissions for CHF exacerbations in the past. Patient denies any weight gain. Has been taking her medications as prescribed. Denies swelling  Level V caveat dementia.  Past Medical History  Diagnosis Date  . Hypertension   . Hypercholesteremia   . ARF (acute renal failure) (HCC)   . TIA (transient ischemic attack)    Past Surgical History  Procedure Laterality Date  . Back surgery    . Orif wrist fracture Left 06/12/2014  . Orif wrist fracture Left 06/12/2014    Procedure: OPEN REDUCTION INTERNAL FIXATION (ORIF) WRIST FRACTURE;  Surgeon: Bradly Bienenstock, MD;  Location: [facility] OR;  Service: Orthopedics;  Laterality: Left;   Family History  Problem Relation Age of Onset  . Hypertension Mother   . Hypertension Father    Social History  Substance Use Topics  . Smoking status: Former Smoker    Quit date: 04/05/1983  . Smokeless tobacco: Never Used  . Alcohol Use: No   OB History    No data available     Review of Systems  Unable to perform ROS: Dementia  HENT: Negative for congestion.   Eyes: Negative for discharge.  Respiratory: Positive for shortness of breath. Negative for cough.   Cardiovascular: Negative for chest pain.  Gastrointestinal: Negative for vomiting.  Skin: Negative for rash.   Allergic/Immunologic: Negative for immunocompromised state.  Neurological: Negative for speech difficulty.  Psychiatric/Behavioral: Negative for behavioral problems.      Allergies  Review of patient's allergies indicates no known allergies.  Home Medications   Prior to Admission medications   Medication Sig Start Date End Date Taking? Authorizing Provider  acetaminophen (TYLENOL) 325 MG tablet Take 2 tablets (650 mg total) by mouth every 6 (six) hours as needed for fever, headache, mild pain or moderate pain. Patient taking differently: Take 650 mg by mouth 2 (two) times daily as needed for fever, headache, mild pain or moderate pain.  06/14/14  Yes Nonie Hoyer, PA-C  albuterol (PROVENTIL HFA;VENTOLIN HFA) 108 (90 BASE) MCG/ACT inhaler Inhale 2 puffs into the lungs every 6 (six) hours as needed for wheezing or shortness of breath. 04/09/13  Yes Catarina Hartshorn, MD  atorvastatin (LIPITOR) 20 MG tablet Take 1 tablet (20 mg total) by mouth daily at 6 PM. 08/07/14  Yes Belkys A Regalado, MD  Calcium Carbonate-Vitamin D (CALCIUM + D PO) Take 1 tablet by mouth daily.   Yes Historical Provider, MD  clopidogrel (PLAVIX) 75 MG tablet Take 1 tablet (75 mg total) by mouth daily with breakfast. 04/09/13  Yes Catarina Hartshorn, MD  docusate sodium (COLACE) 100 MG capsule Take 1 capsule (100 mg total) by mouth 2 (two) times daily as needed for mild constipation. 06/14/14  Yes Nonie Hoyer, PA-C  Fluticasone-Salmeterol (ADVAIR DISKUS) 250-50 MCG/DOSE AEPB Inhale 1 puff into the lungs 2 (two) times daily. 08/30/14  Yes Ripudeep  Jenna LuoK Rai, MD  furosemide (LASIX) 20 MG tablet Take 1 tablet (20 mg total) by mouth every other day. Patient taking differently: Take 20 mg by mouth daily.  08/31/14  Yes Ripudeep Jenna LuoK Rai, MD  magnesium hydroxide (MILK OF MAGNESIA) 400 MG/5ML suspension Take 30 mLs by mouth daily as needed for mild constipation.   Yes Historical Provider, MD  metoprolol tartrate 37.5 MG TABS Take 37.5 mg by mouth 2 (two)  times daily. 10/03/14  Yes Penny Piarlando Vega, MD  Multiple Vitamins-Minerals (MULTIVITAMINS THER. W/MINERALS) TABS tablet Take 1 tablet by mouth daily.   Yes Historical Provider, MD  ondansetron (ZOFRAN) 4 MG tablet Take 4 mg by mouth every 6 (six) hours as needed for nausea or vomiting.   Yes Historical Provider, MD  polyethylene glycol (MIRALAX / GLYCOLAX) packet Take 17 g by mouth daily as needed. Patient taking differently: Take 17 g by mouth daily as needed for mild constipation.  06/14/14  Yes Nonie HoyerMegan N Baird, PA-C  sertraline (ZOLOFT) 50 MG tablet Take 50 mg by mouth daily.    Yes Historical Provider, MD   BP 225/117 mmHg  Pulse 85  Temp(Src) 97.4 F (36.3 C) (Oral)  Resp 22  SpO2 93% Physical Exam  Constitutional: She appears well-developed and well-nourished.  HENT:  Head: Normocephalic and atraumatic.  Eyes: Conjunctivae are normal. Right eye exhibits no discharge.  Neck: Neck supple.  Cardiovascular: Normal rate, regular rhythm and normal heart sounds.   No murmur heard. Pulmonary/Chest: She has no wheezes. She has rales.  Patient tachypnea. Using accessory muscle use.  Abdominal: Soft. She exhibits distension. There is no tenderness.  Musculoskeletal: Normal range of motion. She exhibits no edema.  Neurological: No cranial nerve deficit.  Skin: Skin is dry. She is not diaphoretic.  Psychiatric: She has a normal mood and affect.  Nursing note and vitals reviewed.   ED Course  Procedures (including critical care time) Labs Review Labs Reviewed  CBC WITH DIFFERENTIAL/PLATELET  COMPREHENSIVE METABOLIC PANEL  PROTIME-INR  BLOOD GAS, VENOUS  BRAIN NATRIURETIC PEPTIDE  I-STAT TROPOININ, ED  I-STAT CG4 LACTIC ACID, ED    Imaging Review No results found. I have personally reviewed and evaluated these images and lab results as part of my medical decision-making.   EKG Interpretation   Date/Time:  Friday January 24 2015 04:32:13 EDT Ventricular Rate:  115 PR Interval:     QRS Duration: 98 QT Interval:  348 QTC Calculation: 481 R Axis:   -37 Text Interpretation:  Atrial flutter/fibrillation Left axis deviation  Abnormal R-wave progression, late transition Borderline T wave  abnormalities Atrial fibrillation No significant change since last tracing  Confirmed by Kandis MannanMACKUEN, COURTNEY ([id number]) on 01/24/2015 4:34:08 AM      MDM   Final diagnoses:  None    Patient is a 81 year old female presenting today with increased shortness of breath. Patient has history of CHF COPD is exhibited. Lung exam is significant for rales, no wheezes. This is likely a CHF exacerbation. Patient has no new edema. However her blood pressure is elevated which makes me concerned for hypertensive pulmonary edema.  We will give nitroglycerin, chest x-ray, BNP, Lasix. Patient's requiring 4 L of oxygen to remain saturations above 92% anticipate the need to admit patient given the degree of oxygen use.  Will do a trial of patient on biapp to get her more comfortable breathing.   Mardell Suttles Randall AnLyn Shani Fitch, MD 01/24/15 [phone]

## 2015-01-24 NOTE — Progress Notes (Addendum)
Rt went to place BIPAP on pt. Per RN pt being moved to 1240. Rt will place BIPAP on pt then . No distress noted pt resting. Per note BIPAP ordered to give pt more comfortable breathing.

## 2015-01-25 DIAGNOSIS — J9601 Acute respiratory failure with hypoxia: Secondary | ICD-10-CM | POA: Diagnosis not present

## 2015-01-25 DIAGNOSIS — I160 Hypertensive urgency: Secondary | ICD-10-CM | POA: Diagnosis not present

## 2015-01-25 DIAGNOSIS — I5033 Acute on chronic diastolic (congestive) heart failure: Secondary | ICD-10-CM | POA: Diagnosis not present

## 2015-01-25 DIAGNOSIS — N183 Chronic kidney disease, stage 3 (moderate): Secondary | ICD-10-CM | POA: Diagnosis not present

## 2015-01-25 LAB — BASIC METABOLIC PANEL
Anion gap: 8 (ref 5–15)
BUN: 27 mg/dL — AB (ref 6–20)
CHLORIDE: 97 mmol/L — AB (ref 101–111)
CO2: 32 mmol/L (ref 22–32)
Calcium: 8.8 mg/dL — ABNORMAL LOW (ref 8.9–10.3)
Creatinine, Ser: 1.34 mg/dL — ABNORMAL HIGH (ref 0.44–1.00)
GFR calc Af Amer: 38 mL/min — ABNORMAL LOW (ref >60)
GFR calc non Af Amer: 33 mL/min — ABNORMAL LOW (ref >60)
GLUCOSE: 146 mg/dL — AB (ref 65–99)
POTASSIUM: 3.2 mmol/L — AB (ref 3.5–5.1)
Sodium: 137 mmol/L (ref 135–145)

## 2015-01-25 LAB — CBC
HCT: 37.3 % (ref 36.0–46.0)
HEMOGLOBIN: 12.1 g/dL (ref 12.0–15.0)
MCH: 30 pg (ref 26.0–34.0)
MCHC: 32.4 g/dL (ref 30.0–36.0)
MCV: 92.6 fL (ref 78.0–100.0)
Platelets: 178 10*3/uL (ref 150–400)
RBC: 4.03 MIL/uL (ref 3.87–5.11)
RDW: 14.7 % (ref 11.5–15.5)
WBC: 9.9 10*3/uL (ref 4.0–10.5)

## 2015-01-25 LAB — URINE CULTURE

## 2015-01-25 MED ORDER — ENOXAPARIN SODIUM 30 MG/0.3ML ~~LOC~~ SOLN
30.0000 mg | Freq: Every day | SUBCUTANEOUS | Status: DC
  Administered 2015-01-26 – 2015-01-27 (×2): 30 mg via SUBCUTANEOUS
  Filled 2015-01-25 (×2): qty 0.3

## 2015-01-25 MED ORDER — IPRATROPIUM-ALBUTEROL 0.5-2.5 (3) MG/3ML IN SOLN
3.0000 mL | Freq: Three times a day (TID) | RESPIRATORY_TRACT | Status: DC
  Administered 2015-01-25 (×2): 3 mL via RESPIRATORY_TRACT
  Filled 2015-01-25 (×4): qty 3

## 2015-01-25 MED ORDER — FUROSEMIDE 40 MG PO TABS
40.0000 mg | ORAL_TABLET | Freq: Every day | ORAL | Status: DC
Start: 2015-01-25 — End: 2015-01-27
  Administered 2015-01-25 – 2015-01-27 (×3): 40 mg via ORAL
  Filled 2015-01-25 (×3): qty 1

## 2015-01-25 NOTE — Progress Notes (Signed)
Patient ID: Yvonne [name], female   DOB: August 27, 1918, 81 y.o.   MRN: [id number]  [facility] PROGRESS NOTE  Yvonne [name] WGN:[id number] DOB: August 27, 1918 DOA: 01/24/2015 PCP: Gaye AlkenBARNES,ELIZABETH STEWART, MD   Brief narrative:    81 year old female with past medical history significant for COPD, CHF, hypertension, TIA, dementia, who presented with main concern of several days duration of progressive dyspnea at rest and with exertion. Please note that pt is unable to provide any history at the time of the admission due to dementia and no family available at bedside. Review of records indicate that pt has had multiple admission for CHF. No reported fevers, chills, abd or urinary concerns.   In ED, pt is somnolent but easy to awake, VS notable for BP as high as 225/117. TR[facility] asked to admit for further evaluation. SDU requested.   Assessment/Plan:    Active Problems:  Acute hypoxic respiratory failure secondary to acute on chronic diastolic CHF exacerbation  - per last 2 D ECHO in 2016, EF 55% with grade II diastolic CHF - continue with Lasix, plan to transition to PO today  - weight is 75 kg on admission and now down to 73.9 kg - pt reports feeling better this AM  - continue to monitor daily weights strict I/O   Active Problems:  Hypertensive urgency - placed on hydralazine 10 mg PO TID - pt also on metoprolol per home medical regimen  - BP is more stable  - add hydralazine as needed    Hypokalemia, due to lasix use  - supplement and repeat BMP in AM   Acute on chronic kidney disease, stage 3, GFR 30-59 ml/min - Cr is up this AM, likely from Lasix  - will transition to oral Lasix today  - CBC in AM   Dementia - pt moe alert this AM, appears to be at her baseline  - PT/OT eval requested   DVT prophylaxis - Lovenox SQ  Code Status: Full.  Family Communication:  plan of care discussed with the patient Disposition Plan: transfer to telemetry unit   IV access:   Peripheral IV  Procedures and diagnostic studies:    Dg Chest 2 View 01/24/2015 Vascular congestion and borderline cardiomegaly, with small bilateral pleural effusions and central airspace opacities, compatible with pulmonary edema.   Dg Wrist Complete Right 01/24/2015  No evidence of fracture or dislocation.   Medical Consultants:  None  Other Consultants:  PT/OT  IAnti-Infectives:   None   Debbora PrestoMAGICK-Moria Brophy, MD  [facility] Pager [phone]  If 7PM-7AM, please contact night-coverage [email or web address] Password [email or web address] 01/25/2015, 6:34 AM   LOS: 1 day   HPI/Subjective: No events overnight.   Objective: Filed Vitals:   01/25/15 0000 01/25/15 0314 01/25/15 0400 01/25/15 0510  BP: 117/54  143/72 148/66  Pulse: 104  100 88  Temp:   97.9 F (36.6 C)   TempSrc:   Oral   Resp:   32 24  Height:      Weight:  73.9 kg (162 lb 14.7 oz)    SpO2: 95%  100% 99%    Intake/Output Summary (Last 24 hours) at 01/25/15 [id number] Last data filed at 01/24/15 2100  Gross per 24 hour  Intake      0 ml  Output   1700 ml  Net  -1700 ml    Exam:   General:  Pt is alert, not in acute distress  Cardiovascular: Regular rate and rhythm, no rubs, no gallops  Respiratory: Clear to auscultation bilaterally,  no wheezing, minimal crackles at bases   Abdomen: Soft, non tender, non distended, bowel sounds present, no guarding  Extremities: No edema, pulses DP and PT palpable bilaterally  Neuro: Grossly nonfocal  Data Reviewed: Basic Metabolic Panel:  Recent Labs Lab 01/24/15 0503 01/24/15 1123 01/25/15 0346  NA 137  --  137  K 3.3*  --  3.2*  CL 101  --  97*  CO2 26  --  32  GLUCOSE 161*  --  146*  BUN 26*  --  27*  CREATININE 1.06* 1.02* 1.34*  CALCIUM 9.1  --  8.8*   Liver Function Tests:  Recent Labs Lab 01/24/15 0503  AST 27  ALT 22  ALKPHOS 47  BILITOT 1.7*  PROT 7.7  ALBUMIN 4.0   CBC:  Recent Labs Lab 01/24/15 0503 01/24/15 1123 01/25/15 0346  WBC 7.8 9.0 9.9   NEUTROABS 6.9  --   --   HGB 12.0 12.9 12.1  HCT 36.6 39.5 37.3  MCV 90.8 90.6 92.6  PLT 159 169 178   Recent Results (from the past 240 hour(s))  MRSA PCR Screening     Status: None   Collection Time: 01/24/15  9:33 AM  Result Value Ref Range Status   MRSA by PCR NEGATIVE NEGATIVE Final    Scheduled Meds: . atorvastatin  20 mg Oral q1800  . clopidogrel  75 mg Oral Q breakfast  . enoxaparin (LOVENOX) injection  40 mg Subcutaneous Daily  . furosemide  40 mg Intravenous Daily  . hydrALAZINE  10 mg Oral 3 times per day  . ipratropium-albuterol  3 mL Nebulization TID  . metoprolol tartrate  37.5 mg Oral BID  . mometasone-formoterol  2 puff Inhalation BID  . sertraline  50 mg Oral Daily   Continuous Infusions:

## 2015-01-25 NOTE — Progress Notes (Signed)
Yvonne [name] 1240 TRANSFERRED TO 1422 BY BED WITH 2 NT'S. FOR TELE. REPORT WAS GIVEN TO HELENA 4W RN.

## 2015-01-25 NOTE — Progress Notes (Signed)
Chaplain referred by nurses to visit Ms Yvonne [name] who continues to press the call button but gives not clear indication of what she needs. Ms Yvonne [name] spent the entire time chaplain was in the room saying she could not hear what was being said and she had no idea what the staff expected of her. She showed no sign of being able to read lips, or desire to write down anything she wished for. She indicated she did not know what staff expected her to do with the food she was given. Ms Yvonne [name] was not interested in talking or having a chaplain in the room at this time. It is clear that her dementia frightens her.   Constant use of the call button will likely continue. Chaplains are available for patient and staff as needed or requested.  Benjie Karvonenharles D. Eleftherios Dudenhoeffer, DMin Chaplain

## 2015-01-26 DIAGNOSIS — I509 Heart failure, unspecified: Secondary | ICD-10-CM | POA: Diagnosis not present

## 2015-01-26 DIAGNOSIS — N183 Chronic kidney disease, stage 3 (moderate): Secondary | ICD-10-CM | POA: Diagnosis not present

## 2015-01-26 DIAGNOSIS — I160 Hypertensive urgency: Secondary | ICD-10-CM | POA: Diagnosis not present

## 2015-01-26 DIAGNOSIS — J9601 Acute respiratory failure with hypoxia: Secondary | ICD-10-CM | POA: Diagnosis not present

## 2015-01-26 LAB — BASIC METABOLIC PANEL
Anion gap: 10 (ref 5–15)
BUN: 40 mg/dL — AB (ref 6–20)
CHLORIDE: 98 mmol/L — AB (ref 101–111)
CO2: 29 mmol/L (ref 22–32)
CREATININE: 1.64 mg/dL — AB (ref 0.44–1.00)
Calcium: 8.5 mg/dL — ABNORMAL LOW (ref 8.9–10.3)
GFR calc Af Amer: 30 mL/min — ABNORMAL LOW (ref >60)
GFR calc non Af Amer: 26 mL/min — ABNORMAL LOW (ref >60)
GLUCOSE: 136 mg/dL — AB (ref 65–99)
POTASSIUM: 3.4 mmol/L — AB (ref 3.5–5.1)
SODIUM: 137 mmol/L (ref 135–145)

## 2015-01-26 LAB — CBC
HCT: 36.9 % (ref 36.0–46.0)
HEMOGLOBIN: 12.1 g/dL (ref 12.0–15.0)
MCH: 30.3 pg (ref 26.0–34.0)
MCHC: 32.8 g/dL (ref 30.0–36.0)
MCV: 92.5 fL (ref 78.0–100.0)
Platelets: 202 10*3/uL (ref 150–400)
RBC: 3.99 MIL/uL (ref 3.87–5.11)
RDW: 14.8 % (ref 11.5–15.5)
WBC: 8.4 10*3/uL (ref 4.0–10.5)

## 2015-01-26 MED ORDER — IPRATROPIUM-ALBUTEROL 0.5-2.5 (3) MG/3ML IN SOLN: 3.0000 mL | RESPIRATORY_TRACT | Status: DC | PRN

## 2015-01-26 NOTE — Clinical Social Work Placement (Signed)
   CLINICAL SOCIAL WORK PLACEMENT  NOTE  Date:  01/26/2015  Patient Details  Name: Yvonne [name] MRN: [id number] Date of Birth: Feb 01, 1919  Clinical Social Work is seeking post-discharge placement for this patient at the Assisted Living Facility level of care (*CSW will initial, date and re-position this form in  chart as items are completed):  Yes   Patient/family provided with [place] [name] list of facilities offering this level of care within the geographic area requested by the patient (or if unable, by the patient'[name] family).  Yes   Patient/family informed of their freedom to choose among providers that offer the needed level of care, that participate in Medicare, Medicaid or managed care program needed by the patient, have an available bed and are willing to accept the patient.  Yes   Patient/family informed of [name] and [facility], as well as of the fact that they are under no obligation to receive care at these facilities.  PASRR submitted to EDS on       PASRR number received on       Existing PASRR number confirmed on 01/26/15     FL2 transmitted to all facilities in geographic area requested by pt/family on 01/26/15     FL2 transmitted to all facilities within larger geographic area on       Patient informed that his/her managed care company has contracts with or will negotiate with certain facilities, including the following:            Patient/family informed of bed offers received.  Patient chooses bed at       Physician recommends and patient chooses bed at      Patient to be transferred to   on  .  Patient to be transferred to facility by       Patient family notified on   of transfer.  Name of family member notified:        PHYSICIAN Please sign FL2     Additional Comment:    _______________________________________________ Yvonne [name], Yvonne Heninger S, LCSW 01/26/2015, 2:43  PM

## 2015-01-26 NOTE — Clinical Social Work Note (Signed)
Clinical Social Work Assessment  Patient Details  Name: Yvonne [name] MRN: [id number] Date of Birth: 1918-09-21  Date of referral:  01/26/15               Reason for consult:  Facility Placement, Discharge Planning (Admitted from facility: [facility])                Permission sought to share information with:  Family Supports, Oceanographeracility Contact Representative Permission granted to share information::  Yes, Verbal Permission Granted  Name::     Yvonne [name]  Agency::  [facility]  Relationship::  HCPOA / friends  Contact Information:  [phone]  Housing/Transportation Living arrangements for the past 2 months:  Assisted Living Facility ([facility]) Source of Information:  Chief Technology Officerower of Attorney Patient Interpreter Needed:  None Criminal Activity/Legal Involvement Pertinent to Current Situation/Hospitalization:  No - Comment as needed Significant Relationships:  Friend Youth worker(HCPOA) Lives with:  Facility Resident ([facility]) Do you feel safe going back to the place where you live?  Yes Need for family participation in patient care:  Yes (Comment) (Patient'[name] HCPOA active in decision making.)  Care giving concerns:  Patient'[name] HCPOA expressed no concerns at this time.   Social Worker assessment / plan:  CSW noted per chart review, patient admitted from [facility]. PT recommendation currently for SNF placement. CSW contacted [facility] to obtain next of kin/HCPOA information. Per ALF, patient'[name] HCPOA is a friend- Yvonne [name]. CSW contacted patient'[name] HCPOA regarding patient'[name] discharge disposition. Patient'[name] HCPOA understanding of PT recommendation for SNF placement at time of discharge, but states patient can receive home health PT at [facility]. Per patient'[name] HCPOA, patient has not done well at SNF in the past. Weekday CSW to follow and assist with discharge.  Employment status:  Retired Health and safety inspectornsurance information:  Teacher, English as a foreign languageManaged Medicare (Multimedia programmerUnited Healthcare Medicare) PT  Recommendations:  Skilled Nursing Facility Information / Referral to community resources:  Skilled Nursing Facility  Patient/Family'[name] Response to care:  Patient'[name] HCPOA understanding and agreeable to CSW plan of care.  Patient/Family'[name] Understanding of and Emotional Response to Diagnosis, Current Treatment, and Prognosis:  Patient'[name] HCPOA understanding and agreeable to CSW plan of care.  Emotional Assessment Appearance:  Other (Comment Required (Patient oriented to self only. CSW spoke with HCPOA.) Attitude/Demeanor/Rapport:  Other (Patient oriented to self only. CSW spoke with HCPOA.) Affect (typically observed):  Other (Patient oriented to self only. CSW spoke with HCPOA.) Orientation:  Oriented to Self, Oriented to Place Alcohol / Substance use:  Not Applicable Psych involvement (Current and /or in the community):  No (Comment) (Not appropriate on this admission.)  Discharge Needs  Concerns to be addressed:  No discharge needs identified Readmission within the last 30 days:  No Current discharge risk:  None Barriers to Discharge:  No Barriers Identified   Yvonne [name], Yvonne Steenson S, LCSW 01/26/2015, 2:34 PM [phone]

## 2015-01-26 NOTE — NC FL2 (Signed)
MEDICAID FL2 LEVEL OF CARE SCREENING TOOL     IDENTIFICATION  Patient Name: Yvonne [name] Birthdate: Mar 01, 1919 Sex: female Admission Date (Current Location): 01/24/2015  [facility] [place] [place]     Facility and Address:  [facility],  [address], [place] [id number]      Provider Number: [id number]  Attending Physician Name and Address:  Dorothea OgleIskra M Myers, MD  Relative Name and Phone Number:  [name], [phone]    Current Level of Care: Hospital Recommended Level of Care: Skilled Nursing Facility, Assisted Living Facility Prior Approval Number:    Date Approved/Denied:   PASRR Number: [id number] [id number]  Discharge Plan: Other (Comment) (Return to [facility].)    Current Diagnoses: Patient Active Problem List   Diagnosis Date Noted  . Acute congestive heart failure (HCC)   . Acute respiratory failure with hypoxia (HCC) 01/24/2015  . Acute on chronic diastolic CHF (congestive heart failure), NYHA class 2 (HCC) 01/24/2015  . Hypertensive urgency 01/24/2015  . HTN (hypertension) 08/08/2014  . CKD (chronic kidney disease) stage 3, GFR 30-59 ml/min 08/08/2014  . History of CVA (cerebrovascular accident) 08/05/2014  . Frequent falls 08/05/2014  . Frequency of urination 08/05/2014  . Cognitive impairment 08/05/2014  . COPD (chronic obstructive pulmonary disease) (HCC) 08/05/2014  . Distal radius fracture, left 06/12/2014  . Wrist fracture, closed 06/12/2014  . Other specified cardiac dysrhythmias(427.89) 04/08/2013  . Hypomagnesemia 04/07/2013  . Abnormal urinalysis 04/05/2013  . Dehydration 04/05/2013  . Orthostatic hypotension 04/05/2013  . Hypokalemia 04/05/2013  . Acute CVA (cerebrovascular accident)-nonhemorrhagic 04/04/2013    Orientation ACTIVITIES/SOCIAL BLADDER RESPIRATION    Self, Place  Family supportive Continent O2 (As needed)  BEHAVIORAL SYMPTOMS/MOOD NEUROLOGICAL BOWEL NUTRITION STATUS  Other (Comment) (n/a)   (n/a) Continent  (See discharge summary.)  PHYSICIAN VISITS COMMUNICATION OF NEEDS Height & Weight Skin    Verbally 5\' 5"  (165.1 cm) 161 lbs. Normal          AMBULATORY STATUS RESPIRATION     (Minimal assistance) O2 (As needed)      Personal Care Assistance Level of Assistance  Bathing Bathing Assistance: Limited assistance          Functional Limitations Info   (n/a)             SPECIAL CARE FACTORS FREQUENCY  PT (By licensed PT), OT (By licensed OT)     PT Frequency: 5 OT Frequency: 5           Additional Factors Info  Code Status, Allergies, Psychotropic Code Status Info: FULL Allergies Info: No known allergies Psychotropic Info: sertraline (ZOLOFT)         Current Medications (01/26/2015): Current Facility-Administered Medications  Medication Dose Route Frequency Provider Last Rate Last Dose  . albuterol (PROVENTIL) (2.5 MG/3ML) 0.083% nebulizer solution 2.5 mg  2.5 mg Nebulization Q2H PRN Dorothea OgleIskra M Myers, MD      . atorvastatin (LIPITOR) tablet 20 mg  20 mg Oral q1800 Dorothea OgleIskra M Myers, MD   20 mg at 01/25/15 1742  . clopidogrel (PLAVIX) tablet 75 mg  75 mg Oral Q breakfast Dorothea OgleIskra M Myers, MD   75 mg at 01/26/15 [id number]  . docusate sodium (COLACE) capsule 100 mg  100 mg Oral BID PRN Dorothea OgleIskra M Myers, MD   100 mg at 01/25/15 1742  . enoxaparin (LOVENOX) injection 30 mg  30 mg Subcutaneous Daily Dorothea OgleIskra M Myers, MD   30 mg at 01/26/15 1027  . furosemide (LASIX) tablet 40  mg  40 mg Oral Daily Dorothea Ogle, MD   40 mg at 01/26/15 1027  . hydrALAZINE (APRESOLINE) injection 10 mg  10 mg Intravenous Q6H PRN Dorothea Ogle, MD   10 mg at 01/24/15 1637  . hydrALAZINE (APRESOLINE) tablet 10 mg  10 mg Oral 3 times per day Dorothea Ogle, MD   10 mg at 01/26/15 1317  . ipratropium-albuterol (DUONEB) 0.5-2.5 (3) MG/3ML nebulizer solution 3 mL  3 mL Nebulization Q4H PRN Dorothea Ogle, MD      . magnesium hydroxide (MILK OF MAGNESIA) suspension 30 mL  30 mL Oral Daily PRN Dorothea Ogle, MD      . metoprolol tartrate (LOPRESSOR) tablet 37.5 mg  37.5 mg Oral BID Dorothea Ogle, MD   37.5 mg at 01/26/15 1026  . mometasone-formoterol (DULERA) 100-5 MCG/ACT inhaler 2 puff  2 puff Inhalation BID Dorothea Ogle, MD   2 puff at 01/25/15 0947  . ondansetron (ZOFRAN) tablet 4 mg  4 mg Oral Q6H PRN Dorothea Ogle, MD      . sertraline (ZOLOFT) tablet 50 mg  50 mg Oral Daily Dorothea Ogle, MD   50 mg at 01/26/15 1027   Do not use this list as official medication orders. Please verify with discharge summary.  Discharge Medications:   Medication List    ASK your doctor about these medications        acetaminophen 325 MG tablet  Commonly known as:  TYLENOL  Take 2 tablets (650 mg total) by mouth every 6 (six) hours as needed for fever, headache, mild pain or moderate pain.     albuterol 108 (90 BASE) MCG/ACT inhaler  Commonly known as:  PROVENTIL HFA;VENTOLIN HFA  Inhale 2 puffs into the lungs every 6 (six) hours as needed for wheezing or shortness of breath.     atorvastatin 20 MG tablet  Commonly known as:  LIPITOR  Take 1 tablet (20 mg total) by mouth daily at 6 PM.     CALCIUM + D PO  Take 1 tablet by mouth daily.     clopidogrel 75 MG tablet  Commonly known as:  PLAVIX  Take 1 tablet (75 mg total) by mouth daily with breakfast.     docusate sodium 100 MG capsule  Commonly known as:  COLACE  Take 1 capsule (100 mg total) by mouth 2 (two) times daily as needed for mild constipation.     Fluticasone-Salmeterol 250-50 MCG/DOSE Aepb  Commonly known as:  ADVAIR DISKUS  Inhale 1 puff into the lungs 2 (two) times daily.     furosemide 20 MG tablet  Commonly known as:  LASIX  Take 1 tablet (20 mg total) by mouth every other day.     magnesium hydroxide 400 MG/5ML suspension  Commonly known as:  MILK OF MAGNESIA  Take 30 mLs by mouth daily as needed for mild constipation.     Metoprolol Tartrate 37.5 MG Tabs  Take 37.5 mg by mouth 2 (two) times daily.      multivitamins ther. w/minerals Tabs tablet  Take 1 tablet by mouth daily.     ondansetron 4 MG tablet  Commonly known as:  ZOFRAN  Take 4 mg by mouth every 6 (six) hours as needed for nausea or vomiting.     polyethylene glycol packet  Commonly known as:  MIRALAX / GLYCOLAX  Take 17 g by mouth daily as needed.     sertraline 50 MG tablet  Commonly known as:  ZOLOFT  Take 50 mg by mouth daily.        Relevant Imaging Results:  Relevant Lab Results:  Recent Labs    Additional Information    Rojelio Brenner [phone]

## 2015-01-26 NOTE — Progress Notes (Signed)
Patient ID: Yvonne [name], female   DOB: [birth year], 81 y.o.   MRN: [id number]  [facility] PROGRESS NOTE  Yvonne [name] NWG:[id number] DOB: [birth year] DOA: 01/24/2015 PCP: Gaye Alken, MD   Brief narrative:    81 year old female with past medical history significant for COPD, CHF, hypertension, TIA, dementia, who presented with main concern of several days duration of progressive dyspnea at rest and with exertion. Please note that pt is unable to provide any history at the time of the admission due to dementia and no family available at bedside. Review of records indicate that pt has had multiple admission for CHF. No reported fevers, chills, abd or urinary concerns.   In ED, pt is somnolent but easy to awake, VS notable for BP as high as 225/117. [facility] asked to admit for further evaluation. SDU requested.   Assessment/Plan:    Active Problems:  Acute hypoxic respiratory failure secondary to acute on chronic diastolic CHF exacerbation  - per last 2 D ECHO in 2016, EF 55% with grade II diastolic CHF - continue with Lasix, plan to transition to PO today  - weight is 75 kg on admission, down to 73.9 --> 73.4  kg - pt reports feeling better this AM  - continue to monitor daily weights strict I/O   Active Problems:  Hypertensive urgency - placed on hydralazine 10 mg PO TID - pt also on metoprolol per home medical regimen  - BP is more stable  - add hydralazine as needed    Hypokalemia, due to lasix use  - supplemented, BMP pending this AM   Acute on chronic kidney disease, stage 3, GFR 30-59 ml/min - Cr is up this AM, likely from Lasix  - BMP pending    Dementia - pt moe alert this AM, appears to be at her baseline  - PT/OT eval requested   DVT prophylaxis - Lovenox SQ  Code Status: Full.  Family Communication:  plan of care discussed with the patient Disposition Plan: home in AM, PT eval pending  IV access:  Peripheral IV  Procedures and  diagnostic studies:    Dg Chest 2 View 01/24/2015 Vascular congestion and borderline cardiomegaly, with small bilateral pleural effusions and central airspace opacities, compatible with pulmonary edema.   Dg Wrist Complete Right 01/24/2015  No evidence of fracture or dislocation.   Medical Consultants:  None  Other Consultants:  PT/OT  IAnti-Infectives:   None   Debbora Presto, MD  [facility] Pager [phone]  If 7PM-7AM, please contact night-coverage [email or web address] Password [email or web address] 01/26/2015, 5:32 AM   LOS: 2 days   HPI/Subjective: No events overnight.   Objective: Filed Vitals:   01/25/15 1735 01/25/15 1956 01/25/15 2140 01/26/15 0525  BP: 109/64  114/84 129/62  Pulse: 110  111 110  Temp: 98.1 F (36.7 C)  98.3 F (36.8 C) 97.5 F (36.4 C)  TempSrc: Oral  Oral Oral  Resp: 23   16  Height:      Weight:    73.4 kg (161 lb 13.1 oz)  SpO2: 94% 97% 97% 97%    Intake/Output Summary (Last 24 hours) at 01/26/15 0532 Last data filed at 01/25/15 2300  Gross per 24 hour  Intake    480 ml  Output    275 ml  Net    205 ml    Exam:   General:  Pt is alert, not in acute distress  Cardiovascular: Regular rate and rhythm, no rubs, no gallops  Respiratory: Clear to auscultation bilaterally,  no wheezing, minimal crackles at bases   Abdomen: Soft, non tender, non distended, bowel sounds present, no guarding  Extremities: No edema, pulses DP and PT palpable bilaterally  Neuro: Grossly nonfocal  Data Reviewed: Basic Metabolic Panel:  Recent Labs Lab 01/24/15 0503 01/24/15 1123 01/25/15 0346  NA 137  --  137  K 3.3*  --  3.2*  CL 101  --  97*  CO2 26  --  32  GLUCOSE 161*  --  146*  BUN 26*  --  27*  CREATININE 1.06* 1.02* 1.34*  CALCIUM 9.1  --  8.8*   Liver Function Tests:  Recent Labs Lab 01/24/15 0503  AST 27  ALT 22  ALKPHOS 47  BILITOT 1.7*  PROT 7.7  ALBUMIN 4.0   CBC:  Recent Labs Lab 01/24/15 0503 01/24/15 1123 01/25/15 0346  01/26/15 0432  WBC 7.8 9.0 9.9 8.4  NEUTROABS 6.9  --   --   --   HGB 12.0 12.9 12.1 12.1  HCT 36.6 39.5 37.3 36.9  MCV 90.8 90.6 92.6 92.5  PLT 159 169 178 202   Recent Results (from the past 240 hour(s))  MRSA PCR Screening     Status: None   Collection Time: 01/24/15  9:33 AM  Result Value Ref Range Status   MRSA by PCR NEGATIVE NEGATIVE Final    Scheduled Meds: . atorvastatin  20 mg Oral q1800  . clopidogrel  75 mg Oral Q breakfast  . enoxaparin (LOVENOX) injection  30 mg Subcutaneous Daily  . furosemide  40 mg Oral Daily  . hydrALAZINE  10 mg Oral 3 times per day  . ipratropium-albuterol  3 mL Nebulization TID  . metoprolol tartrate  37.5 mg Oral BID  . mometasone-formoterol  2 puff Inhalation BID  . sertraline  50 mg Oral Daily   Continuous Infusions:

## 2015-01-26 NOTE — Evaluation (Signed)
Physical Therapy Evaluation Patient Details Name: Yvonne [name] MRN: [id number] DOB: 1919-03-22 Today's Date: 01/26/2015   History of Present Illness  Pt admitted from [facility] with several days hx SOB and dx with acute resp failure.  Pt with hx TIA, dementia and COPD  Clinical Impression  Pt very pleasant and cooperative but presenting with significant functional mobility limitations 2* questionable safety awareness, limited endurance, generalized weakness and ambulatory instability.  Pt would benefit from follow up rehab with 24/7 assist at SNF level or at current ALF if level of assist is available    Follow Up Recommendations SNF;Supervision/Assistance - 24 hour    Equipment Recommendations  None recommended by PT    Recommendations for Other Services OT consult     Precautions / Restrictions Precautions Precautions: Fall Precaution Comments: Pt very unstable with OOB activity and demonstrating R drift with attempts to ambulate Restrictions Weight Bearing Restrictions: No      Mobility  Bed Mobility Overal bed mobility: Modified Independent                Transfers Overall transfer level: Needs assistance Equipment used: None Transfers: Sit to/from Stand Sit to Stand: Min assist         General transfer comment: cues for saftey and min assist to gain balance on standing  Ambulation/Gait Ambulation/Gait assistance: Min assist;Mod assist Ambulation Distance (Feet): 75 Feet Assistive device: Rolling walker (2 wheeled) Gait Pattern/deviations: Step-to pattern;Step-through pattern;Decreased step length - right;Decreased step length - left;Shuffle;Trunk flexed;Drifts right/left Gait velocity: Decreased with multiple rests encouraged 2* SOB   General Gait Details: Pt assisted to ambulate ~75' with RW and min/mod assist for balance/support, to manage RW and to correct for R drift.  Cues for pace, posture and position from RW.   After ambulating pt with BP  115/57, O2 @ 99% on 3L and HR elevated to 199 prior to return to 101.  Stairs            Wheelchair Mobility    Modified Rankin (Stroke Patients Only)       Balance                                             Pertinent Vitals/Pain Pain Assessment: Faces Faces Pain Scale: Hurts little more Pain Location: L upper abdomen with coughing Pain Intervention(s): Limited activity within patient's tolerance;Monitored during session    Home Living Family/patient expects to be discharged to:: Assisted living               Home Equipment: Walker - 2 wheels Additional Comments: walk in shower with standard height toilet    Prior Function Level of Independence: Independent with assistive device(s)         Comments: uses RW for ambulation     Hand Dominance   Dominant Hand: Right (Pt with intermittantly garbled speech - RN aware)    Extremity/Trunk Assessment   Upper Extremity Assessment: Generalized weakness           Lower Extremity Assessment: Generalized weakness      Cervical / Trunk Assessment: Kyphotic  Communication   Communication: HOH;Expressive difficulties  Cognition Arousal/Alertness: Awake/alert Behavior During Therapy: Impulsive Overall Cognitive Status: History of cognitive impairments - at baseline                      General Comments  Exercises        Assessment/Plan    PT Assessment Patient needs continued PT services  PT Diagnosis Difficulty walking   PT Problem List Decreased strength;Decreased activity tolerance;Decreased balance;Decreased mobility;Decreased knowledge of use of DME;Obesity;Pain;Decreased safety awareness  PT Treatment Interventions DME instruction;Gait training;Functional mobility training;Therapeutic activities;Therapeutic exercise;Balance training;Cognitive remediation;Patient/family education   PT Goals (Current goals can be found in the Care Plan section) Acute Rehab PT  Goals Patient Stated Goal: Not feel so tired PT Goal Formulation: With patient Time For Goal Achievement: 02/08/15 Potential to Achieve Goals: Fair    Frequency Min 3X/week   Barriers to discharge        Co-evaluation               End of Session Equipment Utilized During Treatment: Gait belt Activity Tolerance: Patient limited by fatigue Patient left: in chair;with call bell/phone within reach Nurse Communication: Mobility status;Other (comment)    Functional Assessment Tool Used: Clinical judgement Functional Limitation: Mobility: Walking and moving around Mobility: Walking and Moving Around Current Status [phone](G8978): At least 40 percent but less than 60 percent impaired, limited or restricted Mobility: Walking and Moving Around Goal Status [phone](G8979): At least 1 percent but less than 20 percent impaired, limited or restricted    Time: 1125-1148 PT Time Calculation (min) (ACUTE ONLY): 23 min   Charges:   PT Evaluation $Initial PT Evaluation Tier I: 1 Procedure PT Treatments $Gait Training: 8-22 mins   PT G Codes:   PT G-Codes **NOT FOR INPATIENT CLASS** Functional Assessment Tool Used: Clinical judgement Functional Limitation: Mobility: Walking and moving around Mobility: Walking and Moving Around Current Status ([id number](G8978): At least 40 percent but less than 60 percent impaired, limited or restricted Mobility: Walking and Moving Around Goal Status [phone]([phone]): At least 1 percent but less than 20 percent impaired, limited or restricted    [name] 01/26/2015, 1:58 PM

## 2015-01-27 DIAGNOSIS — N183 Chronic kidney disease, stage 3 (moderate): Secondary | ICD-10-CM | POA: Diagnosis not present

## 2015-01-27 DIAGNOSIS — I5033 Acute on chronic diastolic (congestive) heart failure: Secondary | ICD-10-CM | POA: Diagnosis not present

## 2015-01-27 DIAGNOSIS — J9601 Acute respiratory failure with hypoxia: Secondary | ICD-10-CM | POA: Diagnosis not present

## 2015-01-27 DIAGNOSIS — I509 Heart failure, unspecified: Secondary | ICD-10-CM | POA: Diagnosis not present

## 2015-01-27 LAB — CBC
HEMATOCRIT: 36.9 % (ref 36.0–46.0)
Hemoglobin: 12.1 g/dL (ref 12.0–15.0)
MCH: 30 pg (ref 26.0–34.0)
MCHC: 32.8 g/dL (ref 30.0–36.0)
MCV: 91.6 fL (ref 78.0–100.0)
Platelets: 192 10*3/uL (ref 150–400)
RBC: 4.03 MIL/uL (ref 3.87–5.11)
RDW: 14.6 % (ref 11.5–15.5)
WBC: 7 10*3/uL (ref 4.0–10.5)

## 2015-01-27 LAB — BASIC METABOLIC PANEL
Anion gap: 9 (ref 5–15)
BUN: 53 mg/dL — AB (ref 6–20)
CALCIUM: 8.5 mg/dL — AB (ref 8.9–10.3)
CO2: 30 mmol/L (ref 22–32)
CREATININE: 1.59 mg/dL — AB (ref 0.44–1.00)
Chloride: 99 mmol/L — ABNORMAL LOW (ref 101–111)
GFR calc non Af Amer: 27 mL/min — ABNORMAL LOW (ref >60)
GFR, EST AFRICAN AMERICAN: 31 mL/min — AB (ref >60)
GLUCOSE: 116 mg/dL — AB (ref 65–99)
Potassium: 3.2 mmol/L — ABNORMAL LOW (ref 3.5–5.1)
Sodium: 138 mmol/L (ref 135–145)

## 2015-01-27 MED ORDER — IPRATROPIUM-ALBUTEROL 0.5-2.5 (3) MG/3ML IN SOLN: 3.0000 mL | RESPIRATORY_TRACT | Status: DC | PRN

## 2015-01-27 MED ORDER — HYDRALAZINE HCL 10 MG PO TABS: 10.0000 mg | ORAL_TABLET | Freq: Two times a day (BID) | ORAL | Status: DC

## 2015-01-27 MED ORDER — POTASSIUM CHLORIDE CRYS ER 10 MEQ PO TBCR: 10.0000 meq | EXTENDED_RELEASE_TABLET | Freq: Once | ORAL | Status: DC

## 2015-01-27 MED ORDER — FUROSEMIDE 20 MG PO TABS: 20.0000 mg | ORAL_TABLET | Freq: Every day | ORAL | Status: DC

## 2015-01-27 MED ORDER — POTASSIUM CHLORIDE CRYS ER 20 MEQ PO TBCR
40.0000 meq | EXTENDED_RELEASE_TABLET | Freq: Once | ORAL | Status: AC
  Administered 2015-01-27: 40 meq via ORAL
  Filled 2015-01-27: qty 2

## 2015-01-27 NOTE — Discharge Summary (Signed)
Physician Discharge Summary  Yvonne DrossVirginia K [name] WJX:[id number] DOB: [birth year] DOA: 01/24/2015  PCP: Gaye AlkenBARNES,ELIZABETH STEWART, MD  Admit date: 01/24/2015 Discharge date: 01/27/2015  Recommendations for Outpatient Follow-up:  1. Pt will need to follow up with PCP in 2-3 weeks post discharge 2. Please obtain BMP to evaluate electrolytes and kidney function, potassium level 3. Potassium supplemented prior to discharge with K-dur 40 MEQ x 1 dose, continue taking K-dur upon discharge as long as pt taking Lasix  4. Please also check CBC to evaluate Hg and Hct levels 5. Pt also advised to have weight checked upon discharge upon discharge and to report any weight gain of over 3 lbs in 24 - 48 hours, weight on discharge 72 kg 6. Pt also started on Hydralazine PO for better BP control, has responded well to this medication while inpatient   Discharge Diagnoses:  Principal Problem:   Acute respiratory failure with hypoxia (HCC) Active Problems:   Acute on chronic diastolic CHF (congestive heart failure), NYHA class 2 (HCC)   Hypertensive urgency   Hypokalemia   CKD (chronic kidney disease) stage 3, GFR 30-59 ml/min   Acute congestive heart failure (HCC)  Discharge Condition: Stable  Diet recommendation: Heart healthy diet discussed in details    Brief narrative:    81 year old female with past medical history significant for COPD, CHF, hypertension, TIA, dementia, who presented with main concern of several days duration of progressive dyspnea at rest and with exertion. Please note that pt is unable to provide any history at the time of the admission due to dementia and no family available at bedside. Review of records indicate that pt has had multiple admission for CHF. No reported fevers, chills, abd or urinary concerns.   In ED, pt is somnolent but easy to awake, VS notable for BP as high as 225/117. TRH asked to admit for further evaluation. SDU requested.   Assessment/Plan:     Active Problems:  Acute hypoxic respiratory failure secondary to acute on chronic diastolic CHF exacerbation  - per last 2 D ECHO in 2016, EF 55% with grade II diastolic CHF - continue with Lasix 20 mg PO QD - weight is 75 kg on admission, down to 73.9 --> 73.4 --> 72 kg - pt reports feeling better this AM and wants to go home   Active Problems:  Hypertensive urgency - placed on hydralazine and has responded well, ok to continue upon discharge  - pt also on metoprolol per home medical regimen    Hypokalemia, due to lasix use  - supplemented prior to discharge    Acute on chronic kidney disease, stage 3, GFR 30-59 ml/min - Cr trending down, will need to have BMP repeated in an outpatient setting    Dementia - pt moe alert this AM, appears to be at her baseline   Code Status: Full.  Family Communication: plan of care discussed with the patient Disposition Plan: ALF  IV access:  Peripheral IV  Procedures and diagnostic studies:   Dg Chest 2 View 01/24/2015 Vascular congestion and borderline cardiomegaly, with small bilateral pleural effusions and central airspace opacities, compatible with pulmonary edema.   Dg Wrist Complete Right 01/24/2015 No evidence of fracture or dislocation.   Medical Consultants:  None  Other Consultants:  PT/OT  IAnti-Infectives:   None       Discharge Exam: Filed Vitals:   01/27/15 0547  BP: 129/72  Pulse: 84  Temp: 98.8 F (37.1 C)  Resp: 18   Filed Vitals:  01/26/15 0525 01/26/15 1319 01/26/15 2150 01/27/15 0547  BP: 129/62 128/60 116/62 129/72  Pulse: 110 110 98 84  Temp: 97.5 F (36.4 C) 98.1 F (36.7 C)  98.8 F (37.1 C)  TempSrc: Oral Oral  Oral  Resp: 16   18  Height:      Weight: 73.4 kg (161 lb 13.1 oz)   72.1 kg (158 lb 15.2 oz)  SpO2: 97% 97%  97%    General: Pt is alert, follows commands appropriately, not in acute distress Cardiovascular: Regular rate and rhythm, no rubs, no  gallops Respiratory: Clear to auscultation bilaterally, no wheezing, no crackles, no rhonchi Abdominal: Soft, non tender, non distended, bowel sounds +, no guarding Extremities: no edema, no cyanosis, pulses palpable bilaterally DP and PT  Discharge Instructions  Discharge Instructions    Diet - low sodium heart healthy    Complete by:  As directed      Increase activity slowly    Complete by:  As directed             Medication List    TAKE these medications        acetaminophen 325 MG tablet  Commonly known as:  TYLENOL  Take 2 tablets (650 mg total) by mouth every 6 (six) hours as needed for fever, headache, mild pain or moderate pain.     albuterol 108 (90 BASE) MCG/ACT inhaler  Commonly known as:  PROVENTIL HFA;VENTOLIN HFA  Inhale 2 puffs into the lungs every 6 (six) hours as needed for wheezing or shortness of breath.     atorvastatin 20 MG tablet  Commonly known as:  LIPITOR  Take 1 tablet (20 mg total) by mouth daily at 6 PM.     CALCIUM + D PO  Take 1 tablet by mouth daily.     clopidogrel 75 MG tablet  Commonly known as:  PLAVIX  Take 1 tablet (75 mg total) by mouth daily with breakfast.     docusate sodium 100 MG capsule  Commonly known as:  COLACE  Take 1 capsule (100 mg total) by mouth 2 (two) times daily as needed for mild constipation.     Fluticasone-Salmeterol 250-50 MCG/DOSE Aepb  Commonly known as:  ADVAIR DISKUS  Inhale 1 puff into the lungs 2 (two) times daily.     furosemide 20 MG tablet  Commonly known as:  LASIX  Take 1 tablet (20 mg total) by mouth daily.     hydrALAZINE 10 MG tablet  Commonly known as:  APRESOLINE  Take 1 tablet (10 mg total) by mouth 2 (two) times daily.     ipratropium-albuterol 0.5-2.5 (3) MG/3ML Soln  Commonly known as:  DUONEB  Take 3 mLs by nebulization every 4 (four) hours as needed.     magnesium hydroxide 400 MG/5ML suspension  Commonly known as:  MILK OF MAGNESIA  Take 30 mLs by mouth daily as needed  for mild constipation.     Metoprolol Tartrate 37.5 MG Tabs  Take 37.5 mg by mouth 2 (two) times daily.     multivitamins ther. w/minerals Tabs tablet  Take 1 tablet by mouth daily.     ondansetron 4 MG tablet  Commonly known as:  ZOFRAN  Take 4 mg by mouth every 6 (six) hours as needed for nausea or vomiting.     polyethylene glycol packet  Commonly known as:  MIRALAX / GLYCOLAX  Take 17 g by mouth daily as needed.     potassium chloride 10 MEQ  tablet  Commonly known as:  K-DUR,KLOR-CON  Take 1 tablet (10 mEq total) by mouth once.     sertraline 50 MG tablet  Commonly known as:  ZOLOFT  Take 50 mg by mouth daily.            Follow-up Information    Follow up with Gaye Alken, MD.   Specialty:  Family Medicine   Contact information:   [address] [place] [address] [phone]        The results of significant diagnostics from this hospitalization (including imaging, microbiology, ancillary and laboratory) are listed below for reference.     Microbiology: Recent Results (from the past 240 hour(s))  MRSA PCR Screening     Status: None   Collection Time: 01/24/15  9:33 AM  Result Value Ref Range Status   MRSA by PCR NEGATIVE NEGATIVE Final    Comment:        The GeneXpert MRSA Assay (FDA approved for NASAL specimens only), is one component of a comprehensive MRSA colonization surveillance program. It is not intended to diagnose MRSA infection nor to guide or monitor treatment for MRSA infections.   Urine culture     Status: None   Collection Time: 01/24/15 10:55 AM  Result Value Ref Range Status   Specimen Description URINE, CLEAN CATCH  Final   Special Requests NONE  Final   Culture   Final    MULTIPLE SPECIES PRESENT, SUGGEST RECOLLECTION Performed at [facility]    Report Status 01/25/2015 FINAL  Final     Labs: Basic Metabolic Panel:  Recent Labs Lab 01/24/15 0503 01/24/15 1123 01/25/15 0346  01/26/15 0432 01/27/15 0451  NA 137  --  137 137 138  K 3.3*  --  3.2* 3.4* 3.2*  CL 101  --  97* 98* 99*  CO2 26  --  32 29 30  GLUCOSE 161*  --  146* 136* 116*  BUN 26*  --  27* 40* 53*  CREATININE 1.06* 1.02* 1.34* 1.64* 1.59*  CALCIUM 9.1  --  8.8* 8.5* 8.5*   Liver Function Tests:  Recent Labs Lab 01/24/15 0503  AST 27  ALT 22  ALKPHOS 47  BILITOT 1.7*  PROT 7.7  ALBUMIN 4.0   CBC:  Recent Labs Lab 01/24/15 0503 01/24/15 1123 01/25/15 0346 01/26/15 0432 01/27/15 0451  WBC 7.8 9.0 9.9 8.4 7.0  NEUTROABS 6.9  --   --   --   --   HGB 12.0 12.9 12.1 12.1 12.1  HCT 36.6 39.5 37.3 36.9 36.9  MCV 90.8 90.6 92.6 92.5 91.6  PLT 159 169 178 202 192    BNP (last 3 results)  Recent Labs  10/02/14 0753 01/24/15 0503 01/24/15 1122  BNP 704.7* 1013.9* 862.9*   SIGNED: Time coordinating discharge: 30 minutes  MAGICK-Rickell Wiehe, MD  [facility] 01/27/2015, 10:30 AM Pager [phone]  If 7PM-7AM, please contact night-coverage [email or web address] Password [email or web address]

## 2015-01-27 NOTE — Progress Notes (Signed)
OT Cancellation Note  Patient Details Name: Yvonne [name] MRN: [id number] DOB: May 02, 1918   Cancelled Treatment:    Reason Eval/Treat Not Completed: Other (comment)  Noted Dc summary.  Will defer OT eval to ALF Taevion Sikora, Metro KungLorraine D  01/27/2015  Lise AuerLori Bayden Gil, OT [phone]  01/27/2015, 1:59 PM

## 2015-01-27 NOTE — Progress Notes (Signed)
Patient for d/c today to ALF bed at  [facility] as p[facility]. Daughter and patient agreeable to this plan- plan transfer via friends car. Reece LevyJanet Hayde Kilgour, MSW, Theresia MajorsLCSWA [phone]

## 2015-01-27 NOTE — Discharge Instructions (Signed)

## 2015-05-20 ENCOUNTER — Emergency Department (HOSPITAL_COMMUNITY): Payer: Medicare Other

## 2015-05-20 ENCOUNTER — Inpatient Hospital Stay (HOSPITAL_COMMUNITY)
Admission: EM | Admit: 2015-05-20 | Discharge: 2015-05-26 | DRG: 291 | Disposition: A | Discharge status: Home Health | Payer: Medicare Other | Attending: Internal Medicine | Admitting: Internal Medicine

## 2015-05-20 ENCOUNTER — Encounter (HOSPITAL_COMMUNITY): Payer: Self-pay | Admitting: Emergency Medicine

## 2015-05-20 DIAGNOSIS — I10 Essential (primary) hypertension: Secondary | ICD-10-CM | POA: Diagnosis present

## 2015-05-20 DIAGNOSIS — Z66 Do not resuscitate: Secondary | ICD-10-CM | POA: Diagnosis present

## 2015-05-20 DIAGNOSIS — I509 Heart failure, unspecified: Secondary | ICD-10-CM | POA: Insufficient documentation

## 2015-05-20 DIAGNOSIS — I480 Paroxysmal atrial fibrillation: Secondary | ICD-10-CM | POA: Diagnosis not present

## 2015-05-20 DIAGNOSIS — I4891 Unspecified atrial fibrillation: Secondary | ICD-10-CM | POA: Insufficient documentation

## 2015-05-20 DIAGNOSIS — J189 Pneumonia, unspecified organism: Secondary | ICD-10-CM | POA: Insufficient documentation

## 2015-05-20 DIAGNOSIS — I5033 Acute on chronic diastolic (congestive) heart failure: Secondary | ICD-10-CM | POA: Diagnosis present

## 2015-05-20 DIAGNOSIS — Z8673 Personal history of transient ischemic attack (TIA), and cerebral infarction without residual deficits: Secondary | ICD-10-CM | POA: Diagnosis not present

## 2015-05-20 DIAGNOSIS — E785 Hyperlipidemia, unspecified: Secondary | ICD-10-CM | POA: Diagnosis present

## 2015-05-20 DIAGNOSIS — I160 Hypertensive urgency: Secondary | ICD-10-CM | POA: Diagnosis present

## 2015-05-20 DIAGNOSIS — Z87891 Personal history of nicotine dependence: Secondary | ICD-10-CM

## 2015-05-20 DIAGNOSIS — I130 Hypertensive heart and chronic kidney disease with heart failure and stage 1 through stage 4 chronic kidney disease, or unspecified chronic kidney disease: Principal | ICD-10-CM | POA: Diagnosis present

## 2015-05-20 DIAGNOSIS — J440 Chronic obstructive pulmonary disease with acute lower respiratory infection: Secondary | ICD-10-CM | POA: Diagnosis present

## 2015-05-20 DIAGNOSIS — N183 Chronic kidney disease, stage 3 unspecified: Secondary | ICD-10-CM | POA: Diagnosis present

## 2015-05-20 DIAGNOSIS — E860 Dehydration: Secondary | ICD-10-CM | POA: Diagnosis present

## 2015-05-20 DIAGNOSIS — M25562 Pain in left knee: Secondary | ICD-10-CM | POA: Diagnosis not present

## 2015-05-20 DIAGNOSIS — R0602 Shortness of breath: Secondary | ICD-10-CM | POA: Diagnosis present

## 2015-05-20 DIAGNOSIS — I481 Persistent atrial fibrillation: Secondary | ICD-10-CM | POA: Diagnosis not present

## 2015-05-20 DIAGNOSIS — Z7902 Long term (current) use of antithrombotics/antiplatelets: Secondary | ICD-10-CM | POA: Diagnosis not present

## 2015-05-20 DIAGNOSIS — J9601 Acute respiratory failure with hypoxia: Secondary | ICD-10-CM | POA: Diagnosis present

## 2015-05-20 DIAGNOSIS — M79672 Pain in left foot: Secondary | ICD-10-CM

## 2015-05-20 DIAGNOSIS — R0600 Dyspnea, unspecified: Secondary | ICD-10-CM | POA: Diagnosis not present

## 2015-05-20 HISTORY — DX: Paroxysmal atrial fibrillation: I48.0

## 2015-05-20 LAB — COMPREHENSIVE METABOLIC PANEL
ALT: 41 U/L (ref 14–54)
AST: 63 U/L — AB (ref 15–41)
Albumin: 4.1 g/dL (ref 3.5–5.0)
Alkaline Phosphatase: 44 U/L (ref 38–126)
Anion gap: 12 (ref 5–15)
BILIRUBIN TOTAL: 1.7 mg/dL — AB (ref 0.3–1.2)
BUN: 35 mg/dL — AB (ref 6–20)
CALCIUM: 9 mg/dL (ref 8.9–10.3)
CO2: 28 mmol/L (ref 22–32)
CREATININE: 1.49 mg/dL — AB (ref 0.44–1.00)
Chloride: 99 mmol/L — ABNORMAL LOW (ref 101–111)
GFR calc Af Amer: 33 mL/min — ABNORMAL LOW (ref >60)
GFR, EST NON AFRICAN AMERICAN: 29 mL/min — AB (ref >60)
Glucose, Bld: 154 mg/dL — ABNORMAL HIGH (ref 65–99)
Potassium: 4.3 mmol/L (ref 3.5–5.1)
Sodium: 139 mmol/L (ref 135–145)
TOTAL PROTEIN: 7.8 g/dL (ref 6.5–8.1)

## 2015-05-20 LAB — CBC WITH DIFFERENTIAL/PLATELET
BASOS ABS: 0 10*3/uL (ref 0.0–0.1)
Basophils Relative: 0 %
Eosinophils Absolute: 0.1 10*3/uL (ref 0.0–0.7)
Eosinophils Relative: 1 %
HEMATOCRIT: 44.4 % (ref 36.0–46.0)
Hemoglobin: 14.2 g/dL (ref 12.0–15.0)
LYMPHS ABS: 0.8 10*3/uL (ref 0.7–4.0)
LYMPHS PCT: 6 %
MCH: 31.2 pg (ref 26.0–34.0)
MCHC: 32 g/dL (ref 30.0–36.0)
MCV: 97.6 fL (ref 78.0–100.0)
Monocytes Absolute: 0.7 10*3/uL (ref 0.1–1.0)
Monocytes Relative: 5 %
NEUTROS ABS: 13.1 10*3/uL — AB (ref 1.7–7.7)
Neutrophils Relative %: 88 %
Platelets: 181 10*3/uL (ref 150–400)
RBC: 4.55 MIL/uL (ref 3.87–5.11)
RDW: 16.9 % — ABNORMAL HIGH (ref 11.5–15.5)
WBC: 14.8 10*3/uL — AB (ref 4.0–10.5)

## 2015-05-20 LAB — PROTIME-INR
INR: 1.15 (ref 0.00–1.49)
Prothrombin Time: 14.9 seconds (ref 11.6–15.2)

## 2015-05-20 LAB — I-STAT CHEM 8, ED
BUN: 59 mg/dL — ABNORMAL HIGH (ref 6–20)
CHLORIDE: 98 mmol/L — AB (ref 101–111)
CREATININE: 1.3 mg/dL — AB (ref 0.44–1.00)
Calcium, Ion: 1.02 mmol/L — ABNORMAL LOW (ref 1.13–1.30)
Glucose, Bld: 146 mg/dL — ABNORMAL HIGH (ref 65–99)
HEMATOCRIT: 50 % — AB (ref 36.0–46.0)
HEMOGLOBIN: 17 g/dL — AB (ref 12.0–15.0)
POTASSIUM: 5.7 mmol/L — AB (ref 3.5–5.1)
Sodium: 137 mmol/L (ref 135–145)
TCO2: 31 mmol/L (ref 0–100)

## 2015-05-20 LAB — I-STAT CG4 LACTIC ACID, ED
LACTIC ACID, VENOUS: 2.57 mmol/L — AB (ref 0.5–2.0)
Lactic Acid, Venous: 1.9 mmol/L (ref 0.5–2.0)

## 2015-05-20 LAB — I-STAT VENOUS BLOOD GAS, ED
ACID-BASE EXCESS: 2 mmol/L (ref 0.0–2.0)
BICARBONATE: 31.7 meq/L — AB (ref 20.0–24.0)
O2 SAT: 63 %
TCO2: 34 mmol/L (ref 0–100)
pCO2, Ven: 70.1 mmHg (ref 45.0–50.0)
pH, Ven: 7.264 (ref 7.250–7.300)
pO2, Ven: 39 mmHg (ref 30.0–45.0)

## 2015-05-20 LAB — I-STAT TROPONIN, ED: TROPONIN I, POC: 0 ng/mL (ref 0.00–0.08)

## 2015-05-20 LAB — BRAIN NATRIURETIC PEPTIDE: B NATRIURETIC PEPTIDE 5: 529.3 pg/mL — AB (ref 0.0–100.0)

## 2015-05-20 MED ORDER — HEPARIN SODIUM (PORCINE) 5000 UNIT/ML IJ SOLN
5000.0000 [IU] | Freq: Three times a day (TID) | INTRAMUSCULAR | Status: DC
  Administered 2015-05-21 – 2015-05-26 (×17): 5000 [IU] via SUBCUTANEOUS
  Filled 2015-05-20 (×17): qty 1

## 2015-05-20 MED ORDER — LORAZEPAM 2 MG/ML IJ SOLN
1.0000 mg | Freq: Once | INTRAMUSCULAR | Status: AC
  Administered 2015-05-20: 1 mg via INTRAVENOUS

## 2015-05-20 MED ORDER — MORPHINE SULFATE (PF) 2 MG/ML IV SOLN: 1.0000 mg | INTRAVENOUS | Status: DC | PRN

## 2015-05-20 MED ORDER — ONDANSETRON HCL 4 MG PO TABS
4.0000 mg | ORAL_TABLET | Freq: Four times a day (QID) | ORAL | Status: DC | PRN
Start: 2015-05-20 — End: 2015-05-26

## 2015-05-20 MED ORDER — ONDANSETRON HCL 4 MG/2ML IJ SOLN: 4.0000 mg | Freq: Four times a day (QID) | INTRAMUSCULAR | Status: DC | PRN

## 2015-05-20 MED ORDER — ACETAMINOPHEN 325 MG PO TABS: 650.0000 mg | ORAL_TABLET | Freq: Four times a day (QID) | ORAL | Status: DC | PRN

## 2015-05-20 MED ORDER — VANCOMYCIN HCL IN DEXTROSE 1-5 GM/200ML-% IV SOLN
1000.0000 mg | Freq: Once | INTRAVENOUS | Status: AC
  Administered 2015-05-20: 1000 mg via INTRAVENOUS
  Filled 2015-05-20: qty 200

## 2015-05-20 MED ORDER — ACETAMINOPHEN 650 MG RE SUPP: 650.0000 mg | Freq: Four times a day (QID) | RECTAL | Status: DC | PRN

## 2015-05-20 MED ORDER — PIPERACILLIN-TAZOBACTAM 3.375 G IVPB 30 MIN
3.3750 g | Freq: Once | INTRAVENOUS | Status: AC
  Administered 2015-05-20: 3.375 g via INTRAVENOUS
  Filled 2015-05-20: qty 50

## 2015-05-20 MED ORDER — HYDROCODONE-ACETAMINOPHEN 5-325 MG PO TABS
1.0000 | ORAL_TABLET | ORAL | Status: DC | PRN
  Administered 2015-05-24: 1 via ORAL
  Administered 2015-05-25: 2 via ORAL
  Filled 2015-05-20: qty 1
  Filled 2015-05-20: qty 2

## 2015-05-20 MED ORDER — FUROSEMIDE 10 MG/ML IJ SOLN
40.0000 mg | Freq: Once | INTRAMUSCULAR | Status: AC
  Administered 2015-05-20: 40 mg via INTRAVENOUS
  Filled 2015-05-20: qty 4

## 2015-05-20 MED ORDER — SODIUM CHLORIDE 0.9% FLUSH
3.0000 mL | Freq: Two times a day (BID) | INTRAVENOUS | Status: DC
  Administered 2015-05-21 – 2015-05-26 (×9): 3 mL via INTRAVENOUS

## 2015-05-20 MED ORDER — FUROSEMIDE 10 MG/ML IJ SOLN
40.0000 mg | Freq: Two times a day (BID) | INTRAMUSCULAR | Status: DC
  Administered 2015-05-21: 40 mg via INTRAVENOUS
  Filled 2015-05-20: qty 4

## 2015-05-20 MED ORDER — NITROGLYCERIN IN D5W 200-5 MCG/ML-% IV SOLN
0.0000 ug/min | Freq: Once | INTRAVENOUS | Status: AC
  Administered 2015-05-20: 200 ug/min via INTRAVENOUS

## 2015-05-20 MED ORDER — SODIUM CHLORIDE 0.9 % IV SOLN
INTRAVENOUS | Status: DC
  Administered 2015-05-21: 01:00:00 via INTRAVENOUS

## 2015-05-20 MED ORDER — LORAZEPAM 2 MG/ML IJ SOLN
INTRAMUSCULAR | Status: AC
  Administered 2015-05-20: 1 mg via INTRAVENOUS
  Filled 2015-05-20: qty 1

## 2015-05-20 NOTE — ED Notes (Signed)
Per EMS, pt has been experiencing SOB worsening at 1400 today with coughing up some blood. Pt given .4g nitro tab by EMS. Rales heard throughout lung fields. Pt placed on C-pap. SpO2 remained at 84% with EMS throughout transport. MD at bedside.

## 2015-05-20 NOTE — H&P (Signed)
[facility] History and Physical  [name] YNW:[id number] DOB: [birth year] DOA: 05/20/2015  Referring physician: EDP PCP: Gaye Alken, MD   Chief Complaint: SOB  HPI: Yvonne [name] is a 82 y.o. female with past medial history of chronic diastolic CHF , last 2-D echo in June 2016 showed LVEF of 55-60% with grade 2 diastolic dysfunction. Patient is on BiPAP, all of the H&P obtained from ED notes and family members at bedside. Patient lives in ALF, she developed shortness of breath and hemoptysis earlier today so she brought to the ED for further evaluation. In the ED chest x-ray showed congestive heart failure, patient has had high blood pressure of 178/114, started on nitroglycerin drip in the ED and blood pressure improved. She has to be placed on BiPAP, given some Ativan because she was fighting the BiPAP. She is DNR/DNI.  Review of Systems:  Unable to obtain review of systems secondary to BiPAP and drowsiness.  Past Medical History  Diagnosis Date  . Hypertension   . Hypercholesteremia   . ARF (acute renal failure) (HCC)   . TIA (transient ischemic attack)    Past Surgical History  Procedure Laterality Date  . Back surgery    . Orif wrist fracture Left 06/12/2014  . Orif wrist fracture Left 06/12/2014    Procedure: OPEN REDUCTION INTERNAL FIXATION (ORIF) WRIST FRACTURE;  Surgeon: Bradly Bienenstock, MD;  Location: [facility] OR;  Service: Orthopedics;  Laterality: Left;   Social History:   reports that she quit smoking about 32 years ago. She has never used smokeless tobacco. She reports that she does not drink alcohol or use illicit drugs.  No Known Allergies  Family History  Problem Relation Age of Onset  . Hypertension Mother   . Hypertension Father      Prior to Admission medications   Medication Sig Start Date End Date Taking? Authorizing Provider  acetaminophen (TYLENOL) 325 MG tablet Take 2 tablets (650 mg total) by mouth every 6 (six) hours as  needed for fever, headache, mild pain or moderate pain. Patient taking differently: Take 650 mg by mouth 2 (two) times daily as needed for fever, headache, mild pain or moderate pain.  06/14/14  Yes Nonie Hoyer, PA-C  albuterol (PROVENTIL HFA;VENTOLIN HFA) 108 (90 BASE) MCG/ACT inhaler Inhale 2 puffs into the lungs every 6 (six) hours as needed for wheezing or shortness of breath. 04/09/13  Yes Catarina Hartshorn, MD  atorvastatin (LIPITOR) 20 MG tablet Take 1 tablet (20 mg total) by mouth daily at 6 PM. 08/07/14  Yes Belkys A Regalado, MD  calcium-vitamin D (OSCAL WITH D) 500-200 MG-UNIT tablet Take 1 tablet by mouth daily with breakfast.   Yes Historical Provider, MD  clopidogrel (PLAVIX) 75 MG tablet Take 1 tablet (75 mg total) by mouth daily with breakfast. 04/09/13  Yes Catarina Hartshorn, MD  docusate sodium (COLACE) 100 MG capsule Take 1 capsule (100 mg total) by mouth 2 (two) times daily as needed for mild constipation. 06/14/14  Yes Nonie Hoyer, PA-C  Fluticasone-Salmeterol (ADVAIR DISKUS) 250-50 MCG/DOSE AEPB Inhale 1 puff into the lungs 2 (two) times daily. 08/30/14  Yes Ripudeep Jenna Luo, MD  furosemide (LASIX) 20 MG tablet Take 1 tablet (20 mg total) by mouth daily. Patient taking differently: Take 20-40 mg by mouth 2 (two) times daily. Take 40 mg in the morning and 20 mg in the afternoon. 01/27/15  Yes Dorothea Ogle, MD  ipratropium-albuterol (DUONEB) 0.5-2.5 (3) MG/3ML SOLN Take 3 mLs by nebulization  every 4 (four) hours as needed. Patient taking differently: Take 3 mLs by nebulization every 4 (four) hours as needed (shortness of breath).  01/27/15  Yes Dorothea Ogle, MD  levothyroxine (SYNTHROID, LEVOTHROID) 25 MCG tablet Take 25 mcg by mouth daily at 12 noon. 05/14/15  Yes Historical Provider, MD  LORazepam (ATIVAN) 0.5 MG tablet Take 0.5 mg by mouth every 6 (six) hours as needed for anxiety.   Yes Historical Provider, MD  magnesium hydroxide (MILK OF MAGNESIA) 400 MG/5ML suspension Take 30 mLs by mouth daily  as needed for mild constipation.   Yes Historical Provider, MD  metoprolol tartrate (LOPRESSOR) 25 MG tablet Take 37.5 mg by mouth 2 (two) times daily. 04/30/15  Yes Historical Provider, MD  Multiple Vitamins-Minerals (MULTIVITAMINS THER. W/MINERALS) TABS tablet Take 1 tablet by mouth daily.   Yes Historical Provider, MD  ondansetron (ZOFRAN) 4 MG tablet Take 4 mg by mouth every 6 (six) hours as needed for nausea or vomiting.   Yes Historical Provider, MD  polyethylene glycol (MIRALAX / GLYCOLAX) packet Take 17 g by mouth daily as needed. Patient taking differently: Take 17 g by mouth daily as needed for mild constipation.  06/14/14  Yes Nonie Hoyer, PA-C  potassium chloride SA (K-DUR,KLOR-CON) 20 MEQ tablet Take 20 mEq by mouth daily at 12 noon. 04/24/15  Yes Historical Provider, MD  sertraline (ZOLOFT) 50 MG tablet Take 50 mg by mouth daily.    Yes Historical Provider, MD  hydrALAZINE (APRESOLINE) 10 MG tablet Take 1 tablet (10 mg total) by mouth 2 (two) times daily. Patient not taking: Reported on 05/20/2015 01/27/15   Dorothea Ogle, MD  metoprolol tartrate 37.5 MG TABS Take 37.5 mg by mouth 2 (two) times daily. Patient not taking: Reported on 05/20/2015 10/03/14   Penny Pia, MD  potassium chloride SA (K-DUR,KLOR-CON) 10 MEQ tablet Take 1 tablet (10 mEq total) by mouth once. Patient not taking: Reported on 05/20/2015 01/27/15   Dorothea Ogle, MD   Physical Exam: Filed Vitals:   05/20/15 1634 05/20/15 1639  BP: 78/42 109/57  Pulse: 133 134  Resp: 38 31   Constitutional: Oriented to person, place, and time. Well-developed and well-nourished. Cooperative.  Head: Normocephalic and atraumatic.  Nose: Nose normal.  Mouth/Throat: Uvula is midline, oropharynx is clear and moist and mucous membranes are normal.  Eyes: Conjunctivae and EOM are normal. Pupils are equal, round, and reactive to light.  Neck: Trachea normal and normal range of motion. Neck supple.  Cardiovascular: Normal rate, regular  rhythm, S1 normal, S2 normal, normal heart sounds and intact distal pulses.   Pulmonary/Chest: Effort normal and breath sounds normal.  Abdominal: Soft. Bowel sounds are normal. There is no hepatosplenomegaly. There is no tenderness.  Musculoskeletal: Trace pedal edema  Neurological: Patient is drowsy, per ED physician just received Ativan. Skin: Skin is warm, dry and intact.  Psychiatric: Has a normal mood and affect. Speech is normal and behavior is normal.   Labs on Admission:  Basic Metabolic Panel:  Recent Labs Lab 05/20/15 1600 05/20/15 1638  NA 139 137  K 4.3 5.7*  CL 99* 98*  CO2 28  --   GLUCOSE 154* 146*  BUN 35* 59*  CREATININE 1.49* 1.30*  CALCIUM 9.0  --    Liver Function Tests:  Recent Labs Lab 05/20/15 1600  AST 63*  ALT 41  ALKPHOS 44  BILITOT 1.7*  PROT 7.8  ALBUMIN 4.1   No results for input(s): LIPASE, AMYLASE in the  last 168 hours. No results for input(s): AMMONIA in the last 168 hours. CBC:  Recent Labs Lab 05/20/15 1600 05/20/15 1638  WBC 14.8*  --   NEUTROABS 13.1*  --   HGB 14.2 17.0*  HCT 44.4 50.0*  MCV 97.6  --   PLT 181  --    Cardiac Enzymes: No results for input(s): CKTOTAL, CKMB, CKMBINDEX, TROPONINI in the last 168 hours.  BNP (last 3 results)  Recent Labs  01/24/15 0503 01/24/15 1122 05/20/15 1600  BNP 1013.9* 862.9* 529.3*    ProBNP (last 3 results) No results for input(s): PROBNP in the last 8760 hours.  CBG: No results for input(s): GLUCAP in the last 168 hours.  Radiological Exams on Admission: Dg Chest Portable 1 View  05/20/2015  CLINICAL DATA:  Shortness of breath EXAM: PORTABLE CHEST 1 VIEW COMPARISON:  01/24/2015 FINDINGS: Moderate cardiac enlargement. Diffuse interstitial change with more coarse markings in the right lower lobe. Diffusely improved aeration except for right lower lobe. Improved vascular congestion. Small bilateral pleural effusions. IMPRESSION: Improving pulmonary edema with persistent  asymmetric opacity right lung base. This could represent asymmetric edema. Pneumonia or pneumonitis in the right lower lobe not excluded. Electronically Signed   By: Esperanza Heir M.D.   On: 05/20/2015 16:58    EKG: Independently reviewed.   Assessment/Plan Principal Problem:   Acute respiratory failure with hypoxia (HCC) Active Problems:   HTN (hypertension)   CKD (chronic kidney disease) stage 3, GFR 30-59 ml/min   Acute on chronic diastolic CHF (congestive heart failure), NYHA class 2 (HCC)    Acute respiratory failure with hypoxia Presented with oxygen saturation 84% on room air, this is likely secondary to acute congestive heart failure. Surprisingly CXR showed improving pulmonary edema than last time she had x-ray which was October 2016. Currently on BiPAP, to stepdown for further management.  Acute on chronic diastolic CHF Patient is on 20 mg of Lasix at home, trace lower extremity edema, BNP of 529. Chest x-ray showed some pulmonary edema but surprisingly better than last time. Patient was on nitroglycerin drip, currently on Lasix 40 mg twice a day. Follow intake and output, weight daily, when she is able to eat will start low sodium diet.  CKD stage III Creatinine is around baseline of 1.4.  Hypertension Came in with hypertensive urgency likely secondary to acute CHF, this is improved after initiation of nitroglycerin drip. Currently off of the nitroglycerin drip, will use hydralazine as needed.  DNR/DNI I spoke with the family briefly, the confirmed her CODE STATUS to be DNR/DNI. I will have very low threshold she's not improving to involve palliative care.   Code Status: DNR/DNI Family Communication: Double family members at bedside. Disposition Plan: Stepdown  Time spent: 70 minutes  Zynia Wojtowicz A, MD [facility] Pager [phone]

## 2015-05-20 NOTE — ED Notes (Signed)
Darlina Sicilian, [place], Caretaker - [phone].  Family has been updated on the possibility of patient being in ED for night.  Family has gone home.

## 2015-05-20 NOTE — ED Notes (Signed)
Paged Tama Gander, NP regarding patient and BiPAP status.

## 2015-05-20 NOTE — Progress Notes (Signed)
Pt placed on Bipap, was desynchronous with vent.  Mode changed to NIV, Pressure Control.  Will continue to monitor and titrate settings as pt will tolerate.

## 2015-05-20 NOTE — Progress Notes (Signed)
Pt moved from [place] room to D36. Uneventful.

## 2015-05-20 NOTE — ED Notes (Signed)
Bipap being placed at this time, family at bedside

## 2015-05-21 LAB — CBC
HEMATOCRIT: 37.7 % (ref 36.0–46.0)
Hemoglobin: 12 g/dL (ref 12.0–15.0)
MCH: 30.7 pg (ref 26.0–34.0)
MCHC: 31.8 g/dL (ref 30.0–36.0)
MCV: 96.4 fL (ref 78.0–100.0)
PLATELETS: 145 10*3/uL — AB (ref 150–400)
RBC: 3.91 MIL/uL (ref 3.87–5.11)
RDW: 17 % — AB (ref 11.5–15.5)
WBC: 10.4 10*3/uL (ref 4.0–10.5)

## 2015-05-21 LAB — BASIC METABOLIC PANEL
Anion gap: 15 (ref 5–15)
BUN: 38 mg/dL — AB (ref 6–20)
CHLORIDE: 99 mmol/L — AB (ref 101–111)
CO2: 28 mmol/L (ref 22–32)
CREATININE: 1.74 mg/dL — AB (ref 0.44–1.00)
Calcium: 9 mg/dL (ref 8.9–10.3)
GFR calc Af Amer: 28 mL/min — ABNORMAL LOW (ref >60)
GFR calc non Af Amer: 24 mL/min — ABNORMAL LOW (ref >60)
GLUCOSE: 131 mg/dL — AB (ref 65–99)
POTASSIUM: 4.8 mmol/L (ref 3.5–5.1)
SODIUM: 142 mmol/L (ref 135–145)

## 2015-05-21 LAB — TSH: TSH: 8.436 u[IU]/mL — ABNORMAL HIGH (ref 0.350–4.500)

## 2015-05-21 LAB — PROTIME-INR
INR: 1.29 (ref 0.00–1.49)
PROTHROMBIN TIME: 16.2 s — AB (ref 11.6–15.2)

## 2015-05-21 LAB — MRSA PCR SCREENING: MRSA BY PCR: NEGATIVE

## 2015-05-21 LAB — PROCALCITONIN: PROCALCITONIN: 2.35 ng/mL

## 2015-05-21 MED ORDER — MOMETASONE FURO-FORMOTEROL FUM 100-5 MCG/ACT IN AERO
2.0000 | INHALATION_SPRAY | Freq: Two times a day (BID) | RESPIRATORY_TRACT | Status: DC
  Administered 2015-05-21 – 2015-05-25 (×9): 2 via RESPIRATORY_TRACT
  Filled 2015-05-21 (×2): qty 8.8

## 2015-05-21 MED ORDER — ALBUTEROL SULFATE (2.5 MG/3ML) 0.083% IN NEBU: 3.0000 mL | INHALATION_SOLUTION | Freq: Four times a day (QID) | RESPIRATORY_TRACT | Status: DC | PRN

## 2015-05-21 MED ORDER — HEPARIN SODIUM (PORCINE) 5000 UNIT/ML IJ SOLN
INTRAMUSCULAR | Status: AC
  Filled 2015-05-21: qty 1

## 2015-05-21 MED ORDER — CLOPIDOGREL BISULFATE 75 MG PO TABS
75.0000 mg | ORAL_TABLET | Freq: Every day | ORAL | Status: DC
  Administered 2015-05-22 – 2015-05-26 (×5): 75 mg via ORAL
  Filled 2015-05-21 (×5): qty 1

## 2015-05-21 MED ORDER — ATORVASTATIN CALCIUM 20 MG PO TABS
20.0000 mg | ORAL_TABLET | Freq: Every day | ORAL | Status: DC
  Administered 2015-05-21 – 2015-05-25 (×5): 20 mg via ORAL
  Filled 2015-05-21 (×5): qty 1

## 2015-05-21 MED ORDER — SERTRALINE HCL 50 MG PO TABS
50.0000 mg | ORAL_TABLET | Freq: Every day | ORAL | Status: DC
  Administered 2015-05-21 – 2015-05-26 (×6): 50 mg via ORAL
  Filled 2015-05-21: qty 2
  Filled 2015-05-21 (×5): qty 1

## 2015-05-21 MED ORDER — CETYLPYRIDINIUM CHLORIDE 0.05 % MT LIQD
7.0000 mL | Freq: Two times a day (BID) | OROMUCOSAL | Status: DC
  Administered 2015-05-21 – 2015-05-26 (×9): 7 mL via OROMUCOSAL

## 2015-05-21 MED ORDER — LEVOTHYROXINE SODIUM 25 MCG PO TABS
25.0000 ug | ORAL_TABLET | Freq: Every day | ORAL | Status: DC
  Administered 2015-05-22 – 2015-05-26 (×5): 25 ug via ORAL
  Filled 2015-05-21 (×7): qty 1

## 2015-05-21 MED ORDER — DOCUSATE SODIUM 100 MG PO CAPS
100.0000 mg | ORAL_CAPSULE | Freq: Two times a day (BID) | ORAL | Status: DC | PRN
  Administered 2015-05-22: 100 mg via ORAL
  Filled 2015-05-21: qty 1

## 2015-05-21 NOTE — ED Provider Notes (Signed)
CSN: [id number]     Arrival date & time 05/20/15  1549 History   First MD Initiated Contact with Patient 05/20/15 1620     Chief Complaint  Patient presents with  . Shortness of Breath     (Consider location/radiation/quality/duration/timing/severity/associated sxs/prior Treatment) Patient is a 82 y.o. female presenting with shortness of breath.  Shortness of Breath Severity:  Severe Onset quality:  Gradual Duration:  2 hours Timing:  Constant Progression:  Worsening Context comment:  History of CHF Relieved by:  Nothing Worsened by:  Coughing and deep breathing Associated symptoms: cough, diaphoresis and wheezing   Associated symptoms: no fever    Per report patient was noted to be coughing up frothy pink sputum. She was satting in the 70s on room air. EMS placed patient on CPAP with improvement to saturations in the 80s.  Past Medical History  Diagnosis Date  . Hypertension   . Hypercholesteremia   . ARF (acute renal failure) (HCC)   . TIA (transient ischemic attack)    Past Surgical History  Procedure Laterality Date  . Back surgery    . Orif wrist fracture Left 06/12/2014  . Orif wrist fracture Left 06/12/2014    Procedure: OPEN REDUCTION INTERNAL FIXATION (ORIF) WRIST FRACTURE;  Surgeon: Bradly Bienenstock, MD;  Location: [facility] OR;  Service: Orthopedics;  Laterality: Left;   Family History  Problem Relation Age of Onset  . Hypertension Mother   . Hypertension Father    Social History  Substance Use Topics  . Smoking status: Former Smoker    Quit date: 04/05/1983  . Smokeless tobacco: Never Used  . Alcohol Use: No   OB History    No data available     Review of Systems  Constitutional: Positive for diaphoresis. Negative for fever.  Respiratory: Positive for cough, shortness of breath and wheezing.   All other systems reviewed and are negative.     Allergies  Review of patient's allergies indicates no known allergies.  Home Medications   Prior to Admission  medications   Medication Sig Start Date End Date Taking? Authorizing Provider  acetaminophen (TYLENOL) 325 MG tablet Take 2 tablets (650 mg total) by mouth every 6 (six) hours as needed for fever, headache, mild pain or moderate pain. Patient taking differently: Take 650 mg by mouth 2 (two) times daily as needed for fever, headache, mild pain or moderate pain.  06/14/14  Yes Nonie Hoyer, PA-C  albuterol (PROVENTIL HFA;VENTOLIN HFA) 108 (90 BASE) MCG/ACT inhaler Inhale 2 puffs into the lungs every 6 (six) hours as needed for wheezing or shortness of breath. 04/09/13  Yes Catarina Hartshorn, MD  atorvastatin (LIPITOR) 20 MG tablet Take 1 tablet (20 mg total) by mouth daily at 6 PM. 08/07/14  Yes Belkys A Regalado, MD  calcium-vitamin D (OSCAL WITH D) 500-200 MG-UNIT tablet Take 1 tablet by mouth daily with breakfast.   Yes Historical Provider, MD  clopidogrel (PLAVIX) 75 MG tablet Take 1 tablet (75 mg total) by mouth daily with breakfast. 04/09/13  Yes Catarina Hartshorn, MD  docusate sodium (COLACE) 100 MG capsule Take 1 capsule (100 mg total) by mouth 2 (two) times daily as needed for mild constipation. 06/14/14  Yes Nonie Hoyer, PA-C  Fluticasone-Salmeterol (ADVAIR DISKUS) 250-50 MCG/DOSE AEPB Inhale 1 puff into the lungs 2 (two) times daily. 08/30/14  Yes Ripudeep Jenna Luo, MD  furosemide (LASIX) 20 MG tablet Take 1 tablet (20 mg total) by mouth daily. Patient taking differently: Take 20-40 mg by mouth  2 (two) times daily. Take 40 mg in the morning and 20 mg in the afternoon. 01/27/15  Yes Dorothea Ogle, MD  ipratropium-albuterol (DUONEB) 0.5-2.5 (3) MG/3ML SOLN Take 3 mLs by nebulization every 4 (four) hours as needed. Patient taking differently: Take 3 mLs by nebulization every 4 (four) hours as needed (shortness of breath).  01/27/15  Yes Dorothea Ogle, MD  levothyroxine (SYNTHROID, LEVOTHROID) 25 MCG tablet Take 25 mcg by mouth daily at 12 noon. 05/14/15  Yes Historical Provider, MD  LORazepam (ATIVAN) 0.5 MG tablet  Take 0.5 mg by mouth every 6 (six) hours as needed for anxiety.   Yes Historical Provider, MD  magnesium hydroxide (MILK OF MAGNESIA) 400 MG/5ML suspension Take 30 mLs by mouth daily as needed for mild constipation.   Yes Historical Provider, MD  metoprolol tartrate (LOPRESSOR) 25 MG tablet Take 37.5 mg by mouth 2 (two) times daily. 04/30/15  Yes Historical Provider, MD  Multiple Vitamins-Minerals (MULTIVITAMINS THER. W/MINERALS) TABS tablet Take 1 tablet by mouth daily.   Yes Historical Provider, MD  ondansetron (ZOFRAN) 4 MG tablet Take 4 mg by mouth every 6 (six) hours as needed for nausea or vomiting.   Yes Historical Provider, MD  polyethylene glycol (MIRALAX / GLYCOLAX) packet Take 17 g by mouth daily as needed. Patient taking differently: Take 17 g by mouth daily as needed for mild constipation.  06/14/14  Yes Nonie Hoyer, PA-C  potassium chloride SA (K-DUR,KLOR-CON) 20 MEQ tablet Take 20 mEq by mouth daily at 12 noon. 04/24/15  Yes Historical Provider, MD  sertraline (ZOLOFT) 50 MG tablet Take 50 mg by mouth daily.    Yes Historical Provider, MD  hydrALAZINE (APRESOLINE) 10 MG tablet Take 1 tablet (10 mg total) by mouth 2 (two) times daily. Patient not taking: Reported on 05/20/2015 01/27/15   Dorothea Ogle, MD  metoprolol tartrate 37.5 MG TABS Take 37.5 mg by mouth 2 (two) times daily. Patient not taking: Reported on 05/20/2015 10/03/14   Penny Pia, MD  potassium chloride SA (K-DUR,KLOR-CON) 10 MEQ tablet Take 1 tablet (10 mEq total) by mouth once. Patient not taking: Reported on 05/20/2015 01/27/15   Dorothea Ogle, MD   BP 178/127mmHg  Pulse 149  Temp(Src) 100.6 F (38.1 C) (Rectal)  Resp 22  SpO2 84% Physical Exam  Constitutional: She appears well-developed and well-nourished. No distress.  HENT:  Head: Normocephalic and atraumatic.  Right Ear: External ear normal.  Left Ear: External ear normal.  Nose: Nose normal.  Eyes: Conjunctivae and EOM are normal. Pupils are equal, round,  and reactive to light. Right eye exhibits no discharge. Left eye exhibits no discharge. No scleral icterus.  Neck: Normal range of motion. Neck supple.  Cardiovascular: Regular rhythm and normal heart sounds.  Tachycardia present.  Exam reveals no gallop and no friction rub.   No murmur heard. Pulmonary/Chest: No stridor. Tachypnea noted. She is in respiratory distress. She has no wheezes. She has rales.  Abdominal: Soft. She exhibits no distension. There is no tenderness.  Musculoskeletal: She exhibits no edema or tenderness.  Neurological: She is alert. She is disoriented.  Skin: Skin is warm and dry. No rash noted. She is not diaphoretic. No erythema.  Psychiatric: She has a normal mood and affect.    ED Course  Procedures (including critical care time) Labs Review Labs Reviewed  CBC WITH DIFFERENTIAL/PLATELET - Abnormal; Notable for the following:    WBC 14.8 (*)    RDW 16.9 (*)  Neutro Abs 13.1 (*)    All other components within normal limits  COMPREHENSIVE METABOLIC PANEL - Abnormal; Notable for the following:    Chloride 99 (*)    Glucose, Bld 154 (*)    BUN 35 (*)    Creatinine, Ser 1.49 (*)    AST 63 (*)    Total Bilirubin 1.7 (*)    GFR calc non Af Amer 29 (*)    GFR calc Af Amer 33 (*)    All other components within normal limits  BRAIN NATRIURETIC PEPTIDE - Abnormal; Notable for the following:    B Natriuretic Peptide 529.3 (*)    All other components within normal limits  I-STAT CG4 LACTIC ACID, ED - Abnormal; Notable for the following:    Lactic Acid, Venous 2.57 (*)    All other components within normal limits  I-STAT CHEM 8, ED - Abnormal; Notable for the following:    Potassium 5.7 (*)    Chloride 98 (*)    BUN 59 (*)    Creatinine, Ser 1.30 (*)    Glucose, Bld 146 (*)    Calcium, Ion 1.02 (*)    Hemoglobin 17.0 (*)    HCT 50.0 (*)    All other components within normal limits  I-STAT VENOUS BLOOD GAS, ED - Abnormal; Notable for the following:     pCO2, Ven 70.1 (*)    Bicarbonate 31.7 (*)    All other components within normal limits  CULTURE, BLOOD (ROUTINE X 2)  CULTURE, BLOOD (ROUTINE X 2)  PROTIME-INR  PROTIME-INR  TSH  HEMOGLOBIN A1C  BASIC METABOLIC PANEL  CBC  I-STAT TROPOININ, ED  I-STAT CG4 LACTIC ACID, ED    Imaging Review Dg Chest Portable 1 View  05/20/2015  CLINICAL DATA:  Shortness of breath EXAM: PORTABLE CHEST 1 VIEW COMPARISON:  01/24/2015 FINDINGS: Moderate cardiac enlargement. Diffuse interstitial change with more coarse markings in the right lower lobe. Diffusely improved aeration except for right lower lobe. Improved vascular congestion. Small bilateral pleural effusions. IMPRESSION: Improving pulmonary edema with persistent asymmetric opacity right lung base. This could represent asymmetric edema. Pneumonia or pneumonitis in the right lower lobe not excluded. Electronically Signed   By: Esperanza Heir M.D.   On: 05/20/2015 16:58   I have personally reviewed and evaluated these images and lab results as part of my medical decision-making.   EKG Interpretation None      MDM   82 year old female presents in respiratory failure. History as above.  Presentation most consistent with CHF exacerbation. On arrival patient was hypertensive and hypoxic on CPAP. She was transitioned to bilevel and started on a nitro drip. Workup consistent with CHF exacerbation. Chest x-ray revealed pulmonary edema with possible superimposed pneumonia. Patient was covered for healthcare acquired pneumonia. Her respiratory status improved on the BiPAP and she was able to be weaned off. She was admitted to medicine for continued management.  Patient seen in conjunction with Dr. Clayborne Dana.  Diagnostic studies interpreted by me and use to my clinical decision-making.  Final diagnoses:  Acute on chronic diastolic CHF (congestive heart failure), NYHA class 2 (HCC)  Hypertensive urgency        Drema Pry, MD 05/21/15  0011  Marily Memos, MD 05/22/15 [phone]

## 2015-05-21 NOTE — ED Notes (Signed)
Attempted report.  Room not ready at this time.

## 2015-05-21 NOTE — Progress Notes (Signed)
Patient is off bi-pap and on a 6L [place] at this time. RT will continue to monitor.

## 2015-05-21 NOTE — Care Management Note (Signed)
Case Management Note  Patient Details  Name: Yvonne [name] MRN: [id number] Date of Birth: Jul 23, 1918  Subjective/Objective:      Adm w resp failure             Action/Plan: from alf, sw ref placed  Expected Discharge Date:                  Expected Discharge Plan:     In-House Referral:     Discharge planning Services     Post Acute Care Choice:    Choice offered to:     DME Arranged:    DME Agency:     HH Arranged:    HH Agency:     Status of Service:     Medicare Important Message Given:    Date Medicare IM Given:    Medicare IM give by:    Date Additional Medicare IM Given:    Additional Medicare Important Message give by:     If discussed at Long Length of Stay Meetings, dates discussed:    Additional Comments: ur review done  Hanley Hays, RN 05/21/2015, 10:29 AM

## 2015-05-21 NOTE — Progress Notes (Signed)
Pt transported from ED to 2H17 on BiPAP without incident.

## 2015-05-21 NOTE — Progress Notes (Signed)
[facility] [facility] TEAM 1 - Stepdown/ICU TEAM PROGRESS NOTE  Yvonne [name] FAO:[id number] DOB: 03/26/1919 DOA: 05/20/2015 PCP: Gaye Alken, MD  Admit HPI / Brief Narrative: 82 y.o. F ALF resident with history of chronic diastolic CHF (TTE June 2016 EF of 55-60% - grade 2 diastolic dysfunction) who developed shortness of breath and hemoptysis.  In the ED chest x-ray suggested pulmonary edema.  She was noted to have a blood pressure of 178/114, and was started on nitroglycerin drip. She was placed on BiPAP.  HPI/Subjective: The patient is dramatically improved this morning.  She states she has no complaints whatsoever and specifically denies chest pain shortness of breath fevers chills nausea or vomiting.  Her daughters at the bedside and reports that she has short-term memory difficulties and likely does not even remember how poorly she felt the day of her presentation.  Assessment/Plan:  Acute respiratory failure with hypoxia Presented with oxygen saturation 84% on room air - possibly secondary to acute congestive heart failure versus right lower lobe pneumonia - required BiPAP initially - now off BiPAP and doing remarkably well  RLL infiltrate v/s asymmetric edema  No strong signs of infection presently but will need to follow RLL findings - cycle procalcitonin - hold on further abx for now but would have low threshold to resume should she develop fever or pro calcitonin proved to be elevated  Acute exacerbation of chronic diastolic CHF Flash pulmonary edema possibly cause of presenting symptoms - no other sign of significant volume overload presently - perhaps this was due to a brief peak and blood pressure - avoid further diuresis presently due to renal failure  CKD stage III Creatinine baseline is 1.4 - crt is climbing - stop diuresis and follow - actually appears somewhat dehydrated on exam  Hypertension - HTN Urgency  improved w/ nitroglycerin drip, but then developed  hypotension therefore now off nitro - BP reasonably controlled - avoid overaggressive correction given advanced age   Elevated TSH Check FT4  COPD Well compensated at present with no wheeze  Hx of CVA  Code Status: NO CODE - DNR Family Communication: Spoke with daughter at bedside Disposition Plan: Follow on stepdown unit today but if remains off BiPAP could quickly transfer to medical bed - ambulate  Consultants: none  Procedures: none  Antibiotics: Zosyn 2/21  Vanc 2/21   DVT prophylaxis: SQ heparin   Objective: Blood pressure 151/85, pulse 96, temperature 98.3 F (36.8 C), temperature source Axillary, resp. rate 17, height  (1.6 m), weight 72 kg (158 lb 11.7 oz), SpO2 100 %.  Intake/Output Summary (Last 24 hours) at 05/21/15 1025 Last data filed at 05/20/15 1903  Gross per 24 hour  Intake     50 ml  Output      0 ml  Net     50 ml    Exam: General: No acute respiratory distress - alert and very pleasant Lungs: Mild right basilar crackles with no wheeze Cardiovascular: Regular rate and rhythm without murmur gallop or rub normal S1 and S2 Abdomen: Nontender, nondistended, soft, bowel sounds positive, no rebound, no ascites, no appreciable mass Extremities: No significant cyanosis, clubbing, or edema bilateral lower extremities  Data Reviewed:  Basic Metabolic Panel:  Recent Labs Lab 05/20/15 1600 05/20/15 1638 05/21/15 0224  NA 139 137 142  K 4.3 5.7* 4.8  CL 99* 98* 99*  CO2 28  --  28  GLUCOSE 154* 146* 131*  BUN 35* 59* 38*  CREATININE 1.49* 1.30* 1.74*  CALCIUM 9.0  --  9.0    CBC:  Recent Labs Lab 05/20/15 1600 05/20/15 1638 05/21/15 0224  WBC 14.8*  --  10.4  NEUTROABS 13.1*  --   --   HGB 14.2 17.0* 12.0  HCT 44.4 50.0* 37.7  MCV 97.6  --  96.4  PLT 181  --  145*    Liver Function Tests:  Recent Labs Lab 05/20/15 1600  AST 63*  ALT 41  ALKPHOS 44  BILITOT 1.7*  PROT 7.8  ALBUMIN 4.1   Coags:  Recent Labs Lab  05/20/15 1600 05/21/15 0224  INR 1.15 1.29    Recent Results (from the past 240 hour(s))  MRSA PCR Screening     Status: None   Collection Time: 05/21/15  2:09 AM  Result Value Ref Range Status   MRSA by PCR NEGATIVE NEGATIVE Final    Comment:        The GeneXpert MRSA Assay (FDA approved for NASAL specimens only), is one component of a comprehensive MRSA colonization surveillance program. It is not intended to diagnose MRSA infection nor to guide or monitor treatment for MRSA infections.      Studies:   Recent x-ray studies have been reviewed in detail by the Attending Physician  Scheduled Meds:  Scheduled Meds: . furosemide  40 mg Intravenous BID  . heparin  5,000 Units Subcutaneous 3 times per day  . sodium chloride flush  3 mL Intravenous Q12H    Time spent on care of this patient: 35 mins   MCCLUNG,JEFFREY T , MD   [facility] Office  [phone] Pager - Text Page per Loretha Stapler as per below:  On-Call/Text Page:      [email or web address]      password [email or web address]  If 7PM-7AM, please contact night-coverage [email or web address] Password [email or web address] 05/21/2015, 10:25 AM   LOS: 1 day

## 2015-05-22 ENCOUNTER — Encounter (HOSPITAL_COMMUNITY): Payer: Self-pay | Admitting: Physician Assistant

## 2015-05-22 ENCOUNTER — Inpatient Hospital Stay (HOSPITAL_COMMUNITY): Payer: Medicare Other

## 2015-05-22 DIAGNOSIS — I480 Paroxysmal atrial fibrillation: Secondary | ICD-10-CM

## 2015-05-22 DIAGNOSIS — J9601 Acute respiratory failure with hypoxia: Secondary | ICD-10-CM

## 2015-05-22 DIAGNOSIS — Z5309 Procedure and treatment not carried out because of other contraindication: Secondary | ICD-10-CM

## 2015-05-22 DIAGNOSIS — N183 Chronic kidney disease, stage 3 (moderate): Secondary | ICD-10-CM

## 2015-05-22 DIAGNOSIS — I5033 Acute on chronic diastolic (congestive) heart failure: Secondary | ICD-10-CM

## 2015-05-22 LAB — COMPREHENSIVE METABOLIC PANEL
ALBUMIN: 3.4 g/dL — AB (ref 3.5–5.0)
ALK PHOS: 35 U/L — AB (ref 38–126)
ALT: 29 U/L (ref 14–54)
ANION GAP: 11 (ref 5–15)
AST: 24 U/L (ref 15–41)
BILIRUBIN TOTAL: 1.4 mg/dL — AB (ref 0.3–1.2)
BUN: 37 mg/dL — ABNORMAL HIGH (ref 6–20)
CALCIUM: 8.7 mg/dL — AB (ref 8.9–10.3)
CO2: 31 mmol/L (ref 22–32)
CREATININE: 1.55 mg/dL — AB (ref 0.44–1.00)
Chloride: 99 mmol/L — ABNORMAL LOW (ref 101–111)
GFR calc non Af Amer: 27 mL/min — ABNORMAL LOW (ref >60)
GFR, EST AFRICAN AMERICAN: 32 mL/min — AB (ref >60)
GLUCOSE: 143 mg/dL — AB (ref 65–99)
Potassium: 3.8 mmol/L (ref 3.5–5.1)
SODIUM: 141 mmol/L (ref 135–145)
TOTAL PROTEIN: 6.8 g/dL (ref 6.5–8.1)

## 2015-05-22 LAB — CBC
HCT: 38.7 % (ref 36.0–46.0)
Hemoglobin: 11.9 g/dL — ABNORMAL LOW (ref 12.0–15.0)
MCH: 30.1 pg (ref 26.0–34.0)
MCHC: 30.7 g/dL (ref 30.0–36.0)
MCV: 98 fL (ref 78.0–100.0)
PLATELETS: 142 10*3/uL — AB (ref 150–400)
RBC: 3.95 MIL/uL (ref 3.87–5.11)
RDW: 17 % — AB (ref 11.5–15.5)
WBC: 7.7 10*3/uL (ref 4.0–10.5)

## 2015-05-22 LAB — TROPONIN I

## 2015-05-22 LAB — T4, FREE: Free T4: 0.69 ng/dL (ref 0.61–1.12)

## 2015-05-22 LAB — HEMOGLOBIN A1C
HEMOGLOBIN A1C: 6.4 % — AB (ref 4.8–5.6)
MEAN PLASMA GLUCOSE: 137 mg/dL

## 2015-05-22 MED ORDER — FUROSEMIDE 40 MG PO TABS
40.0000 mg | ORAL_TABLET | Freq: Every day | ORAL | Status: DC
  Administered 2015-05-22 – 2015-05-26 (×5): 40 mg via ORAL
  Filled 2015-05-22 (×5): qty 1

## 2015-05-22 MED ORDER — METOPROLOL TARTRATE 12.5 MG HALF TABLET
12.5000 mg | ORAL_TABLET | Freq: Two times a day (BID) | ORAL | Status: DC
  Administered 2015-05-22 (×2): 12.5 mg via ORAL
  Filled 2015-05-22 (×3): qty 1

## 2015-05-22 MED ORDER — SODIUM CHLORIDE 0.9 % IV SOLN
1.5000 g | Freq: Two times a day (BID) | INTRAVENOUS | Status: DC
  Administered 2015-05-22 – 2015-05-23 (×2): 1.5 g via INTRAVENOUS
  Filled 2015-05-22 (×5): qty 1.5

## 2015-05-22 NOTE — Evaluation (Signed)
Clinical/Bedside Swallow Evaluation Patient Details  Name: Yvonne [name] MRN: [id number] Date of Birth: 26-Aug-1918  Today's Date: 05/22/2015 Time: SLP Start Time (ACUTE ONLY): 2841 SLP Stop Time (ACUTE ONLY): 0923 SLP Time Calculation (min) (ACUTE ONLY): 18 min  Past Medical History:  Past Medical History  Diagnosis Date  . Hypertension   . Hypercholesteremia   . ARF (acute renal failure) (HCC)   . TIA (transient ischemic attack)    Past Surgical History:  Past Surgical History  Procedure Laterality Date  . Back surgery    . Orif wrist fracture Left 06/12/2014  . Orif wrist fracture Left 06/12/2014    Procedure: OPEN REDUCTION INTERNAL FIXATION (ORIF) WRIST FRACTURE;  Surgeon: Bradly Bienenstock, MD;  Location: [facility] OR;  Service: Orthopedics;  Laterality: Left;   HPI:  82 y.o. F ALF resident with history of COPD, CVA, chronic diastolic CHF (TTE June 2016 EF of 55-60% - grade 2 diastolic dysfunction) who developed shortness of breath and hemoptysis.  CXR with  RLL infiltrate v/s asymmetric edema    Assessment / Plan / Recommendation Clinical Impression  Patient presents with a normal oropharyngeal swallow without overt evidence of dysphagia. No skilled SLP f/u indicated at this time.     Aspiration Risk  Mild aspiration risk    Diet Recommendation Regular;Thin liquid   Liquid Administration via: Cup;Straw Medication Administration: Whole meds with liquid Supervision: Patient able to self feed Compensations: Slow rate;Small sips/bites Postural Changes: Seated upright at 90 degrees    Other  Recommendations Oral Care Recommendations: Oral care BID   Follow up Recommendations  None               Swallow Study   General HPI: 82 y.o. F ALF resident with history of COPD, CVA, chronic diastolic CHF (TTE June 2016 EF of 55-60% - grade 2 diastolic dysfunction) who developed shortness of breath and hemoptysis.  CXR with  RLL infiltrate v/s asymmetric edema  Type of Study: Bedside  Swallow Evaluation Previous Swallow Assessment: bedside swallow evaluation complete s/p CVA in 2015 and indicated normal oropharyngeal swallowing function Diet Prior to this Study: Regular;Thin liquids Temperature Spikes Noted: No Respiratory Status: Nasal cannula History of Recent Intubation: No Behavior/Cognition: Alert;Cooperative;Pleasant mood (HOH) Oral Cavity Assessment: Within Functional Limits Oral Care Completed by SLP: No Oral Cavity - Dentition: Adequate natural dentition Vision: Functional for self-feeding Self-Feeding Abilities: Able to feed self Patient Positioning: Upright in bed Baseline Vocal Quality: Normal Volitional Cough: Strong Volitional Swallow: Able to elicit    Oral/Motor/Sensory Function Overall Oral Motor/Sensory Function: Within functional limits   Ice Chips Ice chips: Not tested   Thin Liquid Thin Liquid: Within functional limits Presentation: Cup;Self Fed;Straw    Nectar Thick Nectar Thick Liquid: Not tested   Honey Thick Honey Thick Liquid: Not tested   Puree Puree: Not tested   Solid   GO   Solid: Within functional limits Presentation: Self Fed;Spoon       Evrett Hakim MA, CCC-SLP [phone]  Adaeze Better Meryl 05/22/2015,9:37 AM

## 2015-05-22 NOTE — Consult Note (Signed)
Cardiologist:  New Reason for Consult:  Afib  Referring Physician:    JEZLYN WESTERFIELD is an 83 y.o. female.  HPI:   The patient is a 82 yo female with a history of Afib, chronic diastolic HF, HTN, HLD, ARI, TIA.  Her last echo was 08/2014 and revealed an EF of 55-60%, mod concentric hypertrophy, G2DD, mild AI, mod to severe MR.  The LA was moderate to severely dilated. CHADSVASC is 6.   She presented with SOB and hemoptysis but does not seem to remember this.  She says she fell before coming in but does not seem to have any injuries.   She currently denies nausea, vomiting, fever, chest pain, shortness of breath, orthopnea, dizziness, PND, cough, congestion, abdominal pain, hematochezia, melena, lower extremity edema, claudication.     Past Medical History  Diagnosis Date  . Hypertension   . Hypercholesteremia   . ARF (acute renal failure) ([place])   . TIA (transient ischemic attack)   . Paroxysmal atrial fibrillation [facility]     Past Surgical History  Procedure Laterality Date  . Back surgery    . Orif wrist fracture Left 06/12/2014  . Orif wrist fracture Left 06/12/2014    Procedure: OPEN REDUCTION INTERNAL FIXATION (ORIF) WRIST FRACTURE;  Surgeon: Iran Planas, MD;  Location: [place];  Service: Orthopedics;  Laterality: Left;    Family History  Problem Relation Age of Onset  . Hypertension Mother   . Hypertension Father     Social History:  reports that she quit smoking about 32 years ago. She has never used smokeless tobacco. She reports that she does not drink alcohol or use illicit drugs.  Allergies: No Known Allergies  Medications: Scheduled Meds: . ampicillin-sulbactam (UNASYN) IV  1.5 g Intravenous Q12H  . antiseptic oral rinse  7 mL Mouth Rinse BID  . atorvastatin  20 mg Oral q1800  . clopidogrel  75 mg Oral Q breakfast  . furosemide  40 mg Oral Daily  . heparin  5,000 Units Subcutaneous 3 times per day  . levothyroxine  25 mcg Oral QAC breakfast  . metoprolol  tartrate  12.5 mg Oral BID  . mometasone-formoterol  2 puff Inhalation BID  . sertraline  50 mg Oral Daily  . sodium chloride flush  3 mL Intravenous Q12H   Continuous Infusions: . sodium chloride 10 mL/hr at 05/21/15 0101   PRN Meds:.acetaminophen **OR** acetaminophen, albuterol, docusate sodium, HYDROcodone-acetaminophen, morphine injection, ondansetron **OR** ondansetron (ZOFRAN) IV   Results for orders placed or performed during the hospital encounter of 05/20/15 (from the past 48 hour(s))  CBC with Differential     Status: Abnormal   Collection Time: 05/20/15  4:00 PM  Result Value Ref Range   WBC 14.8 (H) 4.0 - 10.5 K/uL   RBC 4.55 3.87 - 5.11 MIL/uL   Hemoglobin 14.2 12.0 - 15.0 g/dL   HCT 44.4 36.0 - 46.0 %   MCV 97.6 78.0 - 100.0 fL   MCH 31.2 26.0 - 34.0 pg   MCHC 32.0 30.0 - 36.0 g/dL   RDW 16.9 (H) 11.5 - 15.5 %   Platelets 181 150 - 400 K/uL   Neutrophils Relative % 88 %   Neutro Abs 13.1 (H) 1.7 - 7.7 K/uL   Lymphocytes Relative 6 %   Lymphs Abs 0.8 0.7 - 4.0 K/uL   Monocytes Relative 5 %   Monocytes Absolute 0.7 0.1 - 1.0 K/uL   Eosinophils Relative 1 %   Eosinophils Absolute 0.1 0.0 -  0.7 K/uL   Basophils Relative 0 %   Basophils Absolute 0.0 0.0 - 0.1 K/uL  Comprehensive metabolic panel     Status: Abnormal   Collection Time: 05/20/15  4:00 PM  Result Value Ref Range   Sodium 139 135 - 145 mmol/L   Potassium 4.3 3.5 - 5.1 mmol/L   Chloride 99 (L) 101 - 111 mmol/L   CO2 28 22 - 32 mmol/L   Glucose, Bld 154 (H) 65 - 99 mg/dL   BUN 35 (H) 6 - 20 mg/dL   Creatinine, Ser 1.49 (H) 0.44 - 1.00 mg/dL   Calcium 9.0 8.9 - 10.3 mg/dL   Total Protein 7.8 6.5 - 8.1 g/dL   Albumin 4.1 3.5 - 5.0 g/dL   AST 63 (H) 15 - 41 U/L   ALT 41 14 - 54 U/L   Alkaline Phosphatase 44 38 - 126 U/L   Total Bilirubin 1.7 (H) 0.3 - 1.2 mg/dL   GFR calc non Af Amer 29 (L) >60 mL/min   GFR calc Af Amer 33 (L) >60 mL/min    Comment: (NOTE) The eGFR has been calculated using the  CKD EPI equation. This calculation has not been validated in all clinical situations. eGFR's persistently <60 mL/min signify possible Chronic Kidney Disease.    Anion gap 12 5 - 15  Brain natriuretic peptide     Status: Abnormal   Collection Time: 05/20/15  4:00 PM  Result Value Ref Range   B Natriuretic Peptide 529.3 (H) 0.0 - 100.0 pg/mL  Protime-INR     Status: None   Collection Time: 05/20/15  4:00 PM  Result Value Ref Range   Prothrombin Time 14.9 11.6 - 15.2 seconds    Comment: HEMOLYSIS AT THIS LEVEL MAY AFFECT RESULT   INR 1.15 0.00 - 1.49    Comment: HEMOLYSIS AT THIS LEVEL MAY AFFECT RESULT  I-Stat Troponin, ED (not at [facility])     Status: None   Collection Time: 05/20/15  4:18 PM  Result Value Ref Range   Troponin i, poc 0.00 0.00 - 0.08 ng/mL   Comment 3            Comment: Due to the release kinetics of cTnI, a negative result within the first hours of the onset of symptoms does not rule out myocardial infarction with certainty. If myocardial infarction is still suspected, repeat the test at appropriate intervals.   I-Stat CG4 Lactic Acid, ED     Status: Abnormal   Collection Time: 05/20/15  4:20 PM  Result Value Ref Range   Lactic Acid, Venous 2.57 (HH) 0.5 - 2.0 mmol/L   Comment NOTIFIED PHYSICIAN   I-Stat venous blood gas, ED     Status: Abnormal   Collection Time: 05/20/15  4:20 PM  Result Value Ref Range   pH, Ven 7.264 7.250 - 7.300   pCO2, Ven 70.1 (HH) 45.0 - 50.0 mmHg   pO2, Ven 39.0 30.0 - 45.0 mmHg   Bicarbonate 31.7 (H) 20.0 - 24.0 mEq/L   TCO2 34 0 - 100 mmol/L   O2 Saturation 63.0 %   Acid-Base Excess 2.0 0.0 - 2.0 mmol/L   Patient temperature HIDE    Sample type VENOUS    Comment NOTIFIED PHYSICIAN   I-Stat Chem 8, ED     Status: Abnormal   Collection Time: 05/20/15  4:38 PM  Result Value Ref Range   Sodium 137 135 - 145 mmol/L   Potassium 5.7 (H) 3.5 - 5.1 mmol/L  Chloride 98 (L) 101 - 111 mmol/L   BUN 59 (H) 6 - 20 mg/dL   Creatinine,  Ser 1.30 (H) 0.44 - 1.00 mg/dL   Glucose, Bld 146 (H) 65 - 99 mg/dL   Calcium, Ion 1.02 (L) 1.13 - 1.30 mmol/L   TCO2 31 0 - 100 mmol/L   Hemoglobin 17.0 (H) 12.0 - 15.0 g/dL   HCT 50.0 (H) 36.0 - 46.0 %  Blood culture (routine x 2)     Status: None (Preliminary result)   Collection Time: 05/20/15  6:05 PM  Result Value Ref Range   Specimen Description BLOOD RIGHT HAND    Special Requests BOTTLES DRAWN AEROBIC AND ANAEROBIC 5CC    Culture NO GROWTH < 24 HOURS    Report Status PENDING   Blood culture (routine x 2)     Status: None (Preliminary result)   Collection Time: 05/20/15  6:10 PM  Result Value Ref Range   Specimen Description BLOOD LEFT HAND    Special Requests BOTTLES DRAWN AEROBIC AND ANAEROBIC 5CC    Culture NO GROWTH < 24 HOURS    Report Status PENDING   I-Stat CG4 Lactic Acid, ED     Status: None   Collection Time: 05/20/15  8:57 PM  Result Value Ref Range   Lactic Acid, Venous 1.90 0.5 - 2.0 mmol/L  MRSA PCR Screening     Status: None   Collection Time: 05/21/15  2:09 AM  Result Value Ref Range   MRSA by PCR NEGATIVE NEGATIVE    Comment:        The GeneXpert MRSA Assay (FDA approved for NASAL specimens only), is one component of a comprehensive MRSA colonization surveillance program. It is not intended to diagnose MRSA infection nor to guide or monitor treatment for MRSA infections.   Hemoglobin A1c     Status: Abnormal   Collection Time: 05/21/15  2:17 AM  Result Value Ref Range   Hgb A1c MFr Bld 6.4 (H) 4.8 - 5.6 %    Comment: (NOTE)         Pre-diabetes: 5.7 - 6.4         Diabetes: >6.4         Glycemic control for adults with diabetes: <7.0    Mean Plasma Glucose 137 mg/dL    Comment: (NOTE) Performed At: [facility] [place], [place] [id number] Lindon Romp MD UX:[id number]   Protime-INR     Status: Abnormal   Collection Time: 05/21/15  2:24 AM  Result Value Ref Range   Prothrombin Time 16.2 (H) 11.6 - 15.2 seconds     INR 1.29 0.00 - 1.49  TSH     Status: Abnormal   Collection Time: 05/21/15  2:24 AM  Result Value Ref Range   TSH 8.436 (H) 0.350 - 4.500 uIU/mL  Basic metabolic panel     Status: Abnormal   Collection Time: 05/21/15  2:24 AM  Result Value Ref Range   Sodium 142 135 - 145 mmol/L   Potassium 4.8 3.5 - 5.1 mmol/L   Chloride 99 (L) 101 - 111 mmol/L   CO2 28 22 - 32 mmol/L   Glucose, Bld 131 (H) 65 - 99 mg/dL   BUN 38 (H) 6 - 20 mg/dL   Creatinine, Ser 1.74 (H) 0.44 - 1.00 mg/dL   Calcium 9.0 8.9 - 10.3 mg/dL   GFR calc non Af Amer 24 (L) >60 mL/min   GFR calc Af Amer 28 (L) >60 mL/min  Comment: (NOTE) The eGFR has been calculated using the CKD EPI equation. This calculation has not been validated in all clinical situations. eGFR's persistently <60 mL/min signify possible Chronic Kidney Disease.    Anion gap 15 5 - 15  CBC     Status: Abnormal   Collection Time: 05/21/15  2:24 AM  Result Value Ref Range   WBC 10.4 4.0 - 10.5 K/uL   RBC 3.91 3.87 - 5.11 MIL/uL   Hemoglobin 12.0 12.0 - 15.0 g/dL    Comment: REPEATED TO VERIFY   HCT 37.7 36.0 - 46.0 %   MCV 96.4 78.0 - 100.0 fL   MCH 30.7 26.0 - 34.0 pg   MCHC 31.8 30.0 - 36.0 g/dL   RDW 17.0 (H) 11.5 - 15.5 %   Platelets 145 (L) 150 - 400 K/uL  Procalcitonin - Baseline     Status: None   Collection Time: 05/21/15  8:20 PM  Result Value Ref Range   Procalcitonin 2.35 ng/mL    Comment:        Interpretation: PCT > 2 ng/mL: Systemic infection (sepsis) is likely, unless other causes are known. (NOTE)         ICU PCT Algorithm               Non ICU PCT Algorithm    ----------------------------     ------------------------------         PCT < 0.25 ng/mL                 PCT < 0.1 ng/mL     Stopping of antibiotics            Stopping of antibiotics       strongly encouraged.               strongly encouraged.    ----------------------------     ------------------------------       PCT level decrease by               PCT <  0.25 ng/mL       >= 80% from peak PCT       OR PCT 0.25 - 0.5 ng/mL          Stopping of antibiotics                                             encouraged.     Stopping of antibiotics           encouraged.    ----------------------------     ------------------------------       PCT level decrease by              PCT >= 0.25 ng/mL       < 80% from peak PCT        AND PCT >= 0.5 ng/mL            Continuing antibiotics                                               encouraged.       Continuing antibiotics            encouraged.    ----------------------------     ------------------------------  PCT level increase compared          PCT > 0.5 ng/mL         with peak PCT AND          PCT >= 0.5 ng/mL             Escalation of antibiotics                                          strongly encouraged.      Escalation of antibiotics        strongly encouraged.   T4, free     Status: None   Collection Time: 05/22/15  4:45 AM  Result Value Ref Range   Free T4 0.69 0.61 - 1.12 ng/dL  Comprehensive metabolic panel     Status: Abnormal   Collection Time: 05/22/15  4:45 AM  Result Value Ref Range   Sodium 141 135 - 145 mmol/L   Potassium 3.8 3.5 - 5.1 mmol/L    Comment: DELTA CHECK NOTED   Chloride 99 (L) 101 - 111 mmol/L   CO2 31 22 - 32 mmol/L   Glucose, Bld 143 (H) 65 - 99 mg/dL   BUN 37 (H) 6 - 20 mg/dL   Creatinine, Ser 1.55 (H) 0.44 - 1.00 mg/dL   Calcium 8.7 (L) 8.9 - 10.3 mg/dL   Total Protein 6.8 6.5 - 8.1 g/dL   Albumin 3.4 (L) 3.5 - 5.0 g/dL   AST 24 15 - 41 U/L   ALT 29 14 - 54 U/L   Alkaline Phosphatase 35 (L) 38 - 126 U/L   Total Bilirubin 1.4 (H) 0.3 - 1.2 mg/dL   GFR calc non Af Amer 27 (L) >60 mL/min   GFR calc Af Amer 32 (L) >60 mL/min    Comment: (NOTE) The eGFR has been calculated using the CKD EPI equation. This calculation has not been validated in all clinical situations. eGFR's persistently <60 mL/min signify possible Chronic Kidney Disease.    Anion gap  11 5 - 15  CBC     Status: Abnormal   Collection Time: 05/22/15  4:45 AM  Result Value Ref Range   WBC 7.7 4.0 - 10.5 K/uL   RBC 3.95 3.87 - 5.11 MIL/uL   Hemoglobin 11.9 (L) 12.0 - 15.0 g/dL   HCT 38.7 36.0 - 46.0 %   MCV 98.0 78.0 - 100.0 fL   MCH 30.1 26.0 - 34.0 pg   MCHC 30.7 30.0 - 36.0 g/dL   RDW 17.0 (H) 11.5 - 15.5 %   Platelets 142 (L) 150 - 400 K/uL  Troponin I     Status: None   Collection Time: 05/22/15  9:22 AM  Result Value Ref Range   Troponin I <0.03 <0.031 ng/mL    Comment:        NO INDICATION OF MYOCARDIAL INJURY.     Dg Chest Port 1 View  05/22/2015  CLINICAL DATA:  Pneumonia. EXAM: PORTABLE CHEST 1 VIEW COMPARISON:  05/20/2015 . FINDINGS: Mediastinum hilar structures are stable. Cardiomegaly with pulmonary vascular prominence and interstitial prominence with bilateral effusions. Findings consistent mild congestive heart failure. Slight interim clearing. Ill-defined infiltrate in the right upper lobe is again noted. This is most consistent pneumonia. Follow-up chest x-rays to demonstrate clearing suggested in order to exclude right upper lobe mass. No pneumothorax. Surgical clip over the right neck. Old  right rib fractures. IMPRESSION: 1. Cardiomegaly with pulmonary vascular prominence and mild interstitial prominence with small pleural effusions. Findings consistent with mild congestive heart failure. Slight interim clearing. 2. Persistent right upper lobe infiltrate most consistent with pneumonia. Close follow-up chest x-ray suggested to demonstrate clearing in order to exclude an right upper lobe mass . Electronically Signed   By: Marcello Moores  Register   On: 05/22/2015 07:10   Dg Chest Portable 1 View  05/20/2015  CLINICAL DATA:  Shortness of breath EXAM: PORTABLE CHEST 1 VIEW COMPARISON:  01/24/2015 FINDINGS: Moderate cardiac enlargement. Diffuse interstitial change with more coarse markings in the right lower lobe. Diffusely improved aeration except for right lower  lobe. Improved vascular congestion. Small bilateral pleural effusions. IMPRESSION: Improving pulmonary edema with persistent asymmetric opacity right lung base. This could represent asymmetric edema. Pneumonia or pneumonitis in the right lower lobe not excluded. Electronically Signed   By: Skipper Cliche M.D.   On: 05/20/2015 16:58    Review of Systems  Constitutional: Negative for fever and diaphoresis.  HENT: Negative for congestion and sore throat.   Respiratory: Negative for cough and shortness of breath.   Cardiovascular: Negative for chest pain, palpitations, orthopnea, leg swelling and PND.  Gastrointestinal: Negative for nausea, vomiting, abdominal pain, blood in stool and melena.  Genitourinary: Negative for hematuria.  Musculoskeletal: Negative for myalgias.  Neurological: Negative for dizziness.  All other systems reviewed and are negative.  Blood pressure 130/56, pulse 124, temperature 97.9 F (36.6 C), temperature source Oral, resp. rate 26, height _0  (1.6 m), weight 158 lb 11.7 oz (72 kg), SpO2 92 %. Physical Exam  Nursing note and vitals reviewed. Constitutional: She is oriented to person, place, and time. She appears well-developed and well-nourished. No distress.  HENT:  Head: Normocephalic and atraumatic.  Mouth/Throat: No oropharyngeal exudate.  Eyes: EOM are normal. Pupils are equal, round, and reactive to light. No scleral icterus.  Neck: Normal range of motion. Neck supple. No JVD present.  Cardiovascular: S1 normal and S2 normal.  An irregularly irregular rhythm present. Tachycardia present.   No murmur heard. Pulses:      Radial pulses are 2+ on the right side, and 2+ on the left side.       Dorsalis pedis pulses are 2+ on the right side, and 2+ on the left side.  Respiratory: Effort normal and breath sounds normal. She has no wheezes. She has no rales.  GI: Soft. Bowel sounds are normal. She exhibits no distension. There is no tenderness.  Musculoskeletal:  She exhibits no edema.  Lymphadenopathy:    She has no cervical adenopathy.  Neurological: She is alert and oriented to person, place, and time. She exhibits normal muscle tone.  Skin: Skin is warm and dry.  Psychiatric: She has a normal mood and affect.     Principal Problem:   Acute respiratory failure with hypoxia (HCC)   Atrial fibrillation with RVR This is not a new problem.  She was in Afib during her admission in October but not addressed.  Her rate is currently controlled around 100 BPM.  CHADSVASC is 7.  Based on weight and Scr she qualifies for eliquis 41m bid.  However, after speaking to PT, she thinks she is a high fall risk so I would not recommend adding anticoagulation.  Her left atrium is severely dilated which would make DCCV less likely to be successful.  I recommend rate control strategy.  She has had one dose of lopressor 12.558mat 1038hrs.  Continue to monitor.  Titrate as BP will allow.      HTN (hypertension)  BP a little elevated.  Lopressor just added.   CKD (chronic kidney disease) stage 3, GFR 30-59 ml/min  SCr better   Acute on chronic diastolic CHF (congestive heart failure), NYHA class 2 (HCC) Net fluids:  -0.2L.  BNP was 529.3 on 2/21.  She looks euvolemic to me.    RLL infiltrate vs aysmetric edema   Tarri Fuller, PAC 05/22/2015, 11:09 AM    The patient was seen, examined and discussed with Tarri Fuller, PA-C and I agree with the above.   82 year old female with prior medical history of hypertension, security stage III, actual chronic diastolic CHF, who was admitted with acute respiratory failure with hypoxia and was found to be in atrial fibrillation with RVR. Patient has known paroxysmal atrial fibrillation. Because of her age, risk fall and history of hemoptysis she is not a "anticoagulation candidate and we'll just continue aspirin. We'll focus on rate control, will add metoprolol 12.5 mg twice a day. We'll continue her home dose of Lasix 40 mg daily as  she appears euvolemic.  Dorothy Spark, MD 05/22/2015

## 2015-05-22 NOTE — Progress Notes (Signed)
Pharmacy Antibiotic Note  Yvonne [name] is a 82 y.o. female admitted on 05/20/2015 with possible aspiration PNA.  Pharmacy has been consulted for Unasyn dosing. Day #1  Plan: Unasyn 1.5g IV Q12h  F/U c/s, LOT, renal fxn   Height:  (160 cm) Weight: 158 lb 11.7 oz (72 kg) IBW/kg (Calculated) : 52.4  Temp (24hrs), Avg:98.4 F (36.9 C), Min:97.9 F (36.6 C), Max:99.1 F (37.3 C)   Recent Labs Lab 05/20/15 1600 05/20/15 1620 05/20/15 1638 05/20/15 2057 05/21/15 0224 05/22/15 0445  WBC 14.8*  --   --   --  10.4 7.7  CREATININE 1.49*  --  1.30*  --  1.74* 1.55*  LATICACIDVEN  --  2.57*  --  1.90  --   --     Estimated Creatinine Clearance: 21.1 mL/min (by C-G formula based on Cr of 1.55).    No Known Allergies  Antimicrobials this admission: 2/23>>unasyn>>  1/21>>vanc x1  1/21>>zosyn x1  Microbiology results: 2/23 Bcx x2>>ngtd  2/23 MRSA PCR>> (-)  Thank you for allowing pharmacy to be a part of this patient's care.  Burman Bruington C. Marvis Moeller, PharmD Pharmacy Resident  Pager: [phone] 05/22/2015 8:57 AM

## 2015-05-22 NOTE — Evaluation (Signed)
Physical Therapy Evaluation Patient Details Name: Yvonne [name] MRN: [id number] DOB: 19-Aug-1918 Today's Date: 05/22/2015   History of Present Illness  82 y.o. F ALF resident with history of COPD, CVA, chronic diastolic CHF (TTE June 2016 EF of 55-60% - grade 2 diastolic dysfunction) who developed shortness of breath and hemoptysis. CXR with RLL infiltrate v/s asymmetric edema     Clinical Impression  Patient demonstrates deficits in functional mobility as indicated below. Will need continued skilled PT to address deficits and maximize function. Will see as indicated and progress as tolerated.  OF NOTE: Patient with desaturation on room air during ambulation to 88%, replaced 2 liters improved to 93%. Patient reports pain going up the back of her neck with activity, improves with rest. Patient with HR elevation 130-140s with activity and noted DOE.      Follow Up Recommendations SNF;Supervision for mobility/OOB    Equipment Recommendations  None recommended by PT (has RW at ALF)    Recommendations for Other Services       Precautions / Restrictions Precautions Precautions: Fall Restrictions Weight Bearing Restrictions: No      Mobility  Bed Mobility Overal bed mobility: Modified Independent             General bed mobility comments: use of bed rail to come to EOB, slightly impulsive  Transfers Overall transfer level: Needs assistance Equipment used: None Transfers: Sit to/from Stand Sit to Stand: Min guard         General transfer comment: Min guard for safety  Ambulation/Gait Ambulation/Gait assistance: Min assist;Mod assist Ambulation Distance (Feet): 60 Feet Assistive device: 2 person hand held assist Gait Pattern/deviations: Step-through pattern;Decreased stride length;Scissoring;Narrow base of support;Ataxic;Drifts right/left     General Gait Details: patient with increased instability during ambulation, poor awareness of environment and difficulty  coordinating functional gait. Several incidence of increased physical assist required due to LOB or patient stepping on her own toes.  Stairs            Wheelchair Mobility    Modified Rankin (Stroke Patients Only)       Balance Overall balance assessment: Needs assistance;History of Falls   Sitting balance-Leahy Scale: Good     Standing balance support: During functional activity Standing balance-Leahy Scale: Fair Standing balance comment: increased lateral and posterior sway noted                             Pertinent Vitals/Pain Pain Assessment: Faces Faces Pain Scale: Hurts little more Pain Location: neck (during activity) Pain Descriptors / Indicators: Pressure Pain Intervention(s): Limited activity within patient's tolerance;Monitored during session;Repositioned;Relaxation    Home Living Family/patient expects to be discharged to:: Assisted living               Home Equipment: Walker - 2 wheels Additional Comments: walk in shower with standard height toilet    Prior Function Level of Independence: Independent with assistive device(s)         Comments: uses RW for ambulation     Hand Dominance   Dominant Hand: Right    Extremity/Trunk Assessment   Upper Extremity Assessment: Generalized weakness           Lower Extremity Assessment: Generalized weakness (poor coordination of gait)         Communication   Communication: HOH  Cognition Arousal/Alertness: Awake/alert Behavior During Therapy: Impulsive Overall Cognitive Status: Impaired/Different from baseline Area of Impairment: Awareness;Safety/judgement;Memory  Memory: Decreased short-term memory   Safety/Judgement: Decreased awareness of safety;Decreased awareness of deficits Awareness: Emergent        General Comments      Exercises        Assessment/Plan    PT Assessment Patient needs continued PT services  PT Diagnosis Difficulty  walking;Abnormality of gait;Generalized weakness   PT Problem List Decreased strength;Decreased activity tolerance;Decreased balance;Decreased mobility;Decreased coordination;Decreased safety awareness;Cardiopulmonary status limiting activity  PT Treatment Interventions DME instruction;Gait training;Functional mobility training;Therapeutic activities;Therapeutic exercise;Balance training;Patient/family education   PT Goals (Current goals can be found in the Care Plan section) Acute Rehab PT Goals Patient Stated Goal: none stated PT Goal Formulation: With patient Time For Goal Achievement: 06/05/15 Potential to Achieve Goals: Good    Frequency Min 2X/week   Barriers to discharge        Co-evaluation               End of Session Equipment Utilized During Treatment: Gait belt;Oxygen Activity Tolerance: Patient tolerated treatment well;Other (comment) (elevated HR, decreased O2 saturations and pain in neck) Patient left: in chair;with call bell/phone within reach Nurse Communication: Mobility status         Time: 1121-1140 PT Time Calculation (min) (ACUTE ONLY): 19 min   Charges:   PT Evaluation $PT Eval Moderate Complexity: 1 Procedure     PT G CodesFabio Asa 2015/06/02, 12:04 PM Charlotte Crumb, PT DPT  [phone]

## 2015-05-22 NOTE — Progress Notes (Signed)
[facility] TEAM 1 - Stepdown/ICU TEAM PROGRESS NOTE  Yvonne [name] WUJ:[id number] DOB: July 25, 1918 DOA: 05/20/2015 PCP: Gaye Alken, MD  Admit HPI / Brief Narrative: 82 y.o. F ALF resident with history of chronic diastolic CHF (TTE June 2016 EF of 55-60% - grade 2 diastolic dysfunction) who developed shortness of breath and hemoptysis.  In the ED chest x-ray suggested pulmonary edema.  She was noted to have a blood pressure of 178/114, and was started on nitroglycerin drip. She was placed on BiPAP.  HPI/Subjective: The patient is dramatically improved this morning.    denies chest pain shortness of breath fevers chills nausea or vomiting.   She says I am ready to leave this world    Assessment/Plan:  Acute respiratory failure with hypoxia Presented with oxygen saturation 84% on room air - possibly secondary to acute congestive heart failure versus right lower lobe pneumonia - required BiPAP initially - now off BiPAP and doing remarkably well. Repeat chest x-ray on 2/23 shows persistent right upper lobe infiltrate consistent with pneumonia.  New onset a fib with RVR present on this  admission  Likely in the setting of PNA, Cardiology consulted, repeat echo, restart beta blocker  Defer anticoagulation decision pending cards eval TSH 8.4   RLL infiltrate v/s asymmetric edema  No strong signs of infection presently but will need to follow RLL findings - cycle procalcitonin -  patient received 1 dose of vancomycin and Zosyn. Restart patient on antibiotics, namely Unasyn for possible aspiration pneumonia. Speech therapy consultation today  Acute exacerbation of chronic diastolic CHF Flash pulmonary edema possibly cause of presenting symptoms -, rule out acute coronary syndrome, continue to cycle cardiac enzymes, no other sign of significant volume overload presently -resume home dose of Lasix 40 milligrams in the morning and 20 mg in the evening  CKD stage III Creatinine  baseline is 1.4 - crt is climbing -  follow closely, could be elevated in the setting of hypertensive urgency  Hypertension - HTN Urgency  improved w/ nitroglycerin drip, but then developed hypotension therefore now off nitro - BP reasonably controlled - avoid overaggressive correction given advanced age , resume Lasix.  Elevated TSH    FT4 0.69  COPD Well compensated at present with no wheeze  Hx of CVA  Code Status: NO CODE - DNR Family Communication: Spoke with daughter at bedside Disposition Plan:   transfer to tele bed  - ambulate  Consultants: none  Procedures: none  Antibiotics: Zosyn 2/21  Vanc 2/21   DVT prophylaxis: SQ heparin   Objective: Blood pressure 146/89, pulse 113, temperature 97.9 F (36.6 C), temperature source Oral, resp. rate 26, height  (1.6 m), weight 72 kg (158 lb 11.7 oz), SpO2 95 %.  Intake/Output Summary (Last 24 hours) at 05/22/15 9562 Last data filed at 05/21/15 1914  Gross per 24 hour  Intake    440 ml  Output    650 ml  Net   -210 ml    Exam: General: No acute respiratory distress - alert and very pleasant Lungs: Mild right basilar crackles with no wheeze Cardiovascular: Regular rate and rhythm without murmur gallop or rub normal S1 and S2 Abdomen: Nontender, nondistended, soft, bowel sounds positive, no rebound, no ascites, no appreciable mass Extremities: No significant cyanosis, clubbing, or edema bilateral lower extremities  Data Reviewed:  Basic Metabolic Panel:  Recent Labs Lab 05/20/15 1600 05/20/15 1638 05/21/15 0224 05/22/15 0445  NA 139 137 142 141  K 4.3 5.7* 4.8 3.8  CL 99* 98* 99* 99*  CO2 28  --  28 31  GLUCOSE 154* 146* 131* 143*  BUN 35* 59* 38* 37*  CREATININE 1.49* 1.30* 1.74* 1.55*  CALCIUM 9.0  --  9.0 8.7*    CBC:  Recent Labs Lab 05/20/15 1600 05/20/15 1638 05/21/15 0224 05/22/15 0445  WBC 14.8*  --  10.4 7.7  NEUTROABS 13.1*  --   --   --   HGB 14.2 17.0* 12.0 11.9*  HCT 44.4  50.0* 37.7 38.7  MCV 97.6  --  96.4 98.0  PLT 181  --  145* 142*    Liver Function Tests:  Recent Labs Lab 05/20/15 1600 05/22/15 0445  AST 63* 24  ALT 41 29  ALKPHOS 44 35*  BILITOT 1.7* 1.4*  PROT 7.8 6.8  ALBUMIN 4.1 3.4*   Coags:  Recent Labs Lab 05/20/15 1600 05/21/15 0224  INR 1.15 1.29    Recent Results (from the past 240 hour(s))  Blood culture (routine x 2)     Status: None (Preliminary result)   Collection Time: 05/20/15  6:05 PM  Result Value Ref Range Status   Specimen Description BLOOD RIGHT HAND  Final   Special Requests BOTTLES DRAWN AEROBIC AND ANAEROBIC 5CC  Final   Culture NO GROWTH < 24 HOURS  Final   Report Status PENDING  Incomplete  Blood culture (routine x 2)     Status: None (Preliminary result)   Collection Time: 05/20/15  6:10 PM  Result Value Ref Range Status   Specimen Description BLOOD LEFT HAND  Final   Special Requests BOTTLES DRAWN AEROBIC AND ANAEROBIC 5CC  Final   Culture NO GROWTH < 24 HOURS  Final   Report Status PENDING  Incomplete  MRSA PCR Screening     Status: None   Collection Time: 05/21/15  2:09 AM  Result Value Ref Range Status   MRSA by PCR NEGATIVE NEGATIVE Final    Comment:        The GeneXpert MRSA Assay (FDA approved for NASAL specimens only), is one component of a comprehensive MRSA colonization surveillance program. It is not intended to diagnose MRSA infection nor to guide or monitor treatment for MRSA infections.      Studies:   Recent x-ray studies have been reviewed in detail by the Attending Physician  Scheduled Meds:  Scheduled Meds: . antiseptic oral rinse  7 mL Mouth Rinse BID  . atorvastatin  20 mg Oral q1800  . clopidogrel  75 mg Oral Q breakfast  . heparin  5,000 Units Subcutaneous 3 times per day  . levothyroxine  25 mcg Oral QAC breakfast  . mometasone-formoterol  2 puff Inhalation BID  . sertraline  50 mg Oral Daily  . sodium chloride flush  3 mL Intravenous Q12H    Time spent  on care of this patient: 35 mins   Richarda Overlie , MD   [facility] Office  [phone] Pager - Text Page per Loretha Stapler as per below:  On-Call/Text Page:      [email or web address]      password [email or web address]  If 7PM-7AM, please contact night-coverage [email or web address] Password [facility] 05/22/2015, 8:19 AM   LOS: 2 days

## 2015-05-23 ENCOUNTER — Inpatient Hospital Stay (HOSPITAL_COMMUNITY): Payer: Medicare Other

## 2015-05-23 DIAGNOSIS — I481 Persistent atrial fibrillation: Secondary | ICD-10-CM

## 2015-05-23 DIAGNOSIS — I10 Essential (primary) hypertension: Secondary | ICD-10-CM

## 2015-05-23 DIAGNOSIS — R0600 Dyspnea, unspecified: Secondary | ICD-10-CM

## 2015-05-23 LAB — COMPREHENSIVE METABOLIC PANEL
ALT: 23 U/L (ref 14–54)
ANION GAP: 8 (ref 5–15)
AST: 18 U/L (ref 15–41)
Albumin: 3 g/dL — ABNORMAL LOW (ref 3.5–5.0)
Alkaline Phosphatase: 33 U/L — ABNORMAL LOW (ref 38–126)
BUN: 28 mg/dL — ABNORMAL HIGH (ref 6–20)
CHLORIDE: 98 mmol/L — AB (ref 101–111)
CO2: 32 mmol/L (ref 22–32)
Calcium: 8.6 mg/dL — ABNORMAL LOW (ref 8.9–10.3)
Creatinine, Ser: 1.19 mg/dL — ABNORMAL HIGH (ref 0.44–1.00)
GFR, EST AFRICAN AMERICAN: 44 mL/min — AB (ref >60)
GFR, EST NON AFRICAN AMERICAN: 38 mL/min — AB (ref >60)
Glucose, Bld: 142 mg/dL — ABNORMAL HIGH (ref 65–99)
POTASSIUM: 4.3 mmol/L (ref 3.5–5.1)
Sodium: 138 mmol/L (ref 135–145)
Total Bilirubin: 1.5 mg/dL — ABNORMAL HIGH (ref 0.3–1.2)
Total Protein: 6.5 g/dL (ref 6.5–8.1)

## 2015-05-23 LAB — CBC
HCT: 37.5 % (ref 36.0–46.0)
Hemoglobin: 12 g/dL (ref 12.0–15.0)
MCH: 31.7 pg (ref 26.0–34.0)
MCHC: 32 g/dL (ref 30.0–36.0)
MCV: 99.2 fL (ref 78.0–100.0)
PLATELETS: 135 10*3/uL — AB (ref 150–400)
RBC: 3.78 MIL/uL — ABNORMAL LOW (ref 3.87–5.11)
RDW: 16.7 % — AB (ref 11.5–15.5)
WBC: 7 10*3/uL (ref 4.0–10.5)

## 2015-05-23 LAB — URIC ACID: URIC ACID, SERUM: 9.3 mg/dL — AB (ref 2.3–6.6)

## 2015-05-23 LAB — PROCALCITONIN: Procalcitonin: 0.95 ng/mL

## 2015-05-23 MED ORDER — METOPROLOL TARTRATE 25 MG PO TABS
25.0000 mg | ORAL_TABLET | Freq: Two times a day (BID) | ORAL | Status: DC
  Administered 2015-05-23 – 2015-05-24 (×3): 25 mg via ORAL
  Filled 2015-05-23 (×3): qty 1

## 2015-05-23 MED ORDER — FUROSEMIDE 20 MG PO TABS
20.0000 mg | ORAL_TABLET | Freq: Every day | ORAL | Status: DC
  Administered 2015-05-23 – 2015-05-25 (×3): 20 mg via ORAL
  Filled 2015-05-23 (×3): qty 1

## 2015-05-23 MED ORDER — SODIUM CHLORIDE 0.9 % IV SOLN
1.5000 g | Freq: Two times a day (BID) | INTRAVENOUS | Status: DC
  Administered 2015-05-23 – 2015-05-25 (×4): 1.5 g via INTRAVENOUS
  Filled 2015-05-23 (×6): qty 1.5

## 2015-05-23 NOTE — Progress Notes (Signed)
Patient Name: Yvonne [name] Date of Encounter: 05/23/2015  Principal Problem:   Acute respiratory failure with hypoxia (HCC) Active Problems:   HTN (hypertension)   CKD (chronic kidney disease) stage 3, GFR 30-59 ml/min   Acute on chronic diastolic CHF (congestive heart failure), NYHA class 2 (HCC)   Length of Stay: 3  SUBJECTIVE  Denies palpitations or SOB.  CURRENT MEDS . ampicillin-sulbactam (UNASYN) IV  1.5 g Intravenous Q12H  . antiseptic oral rinse  7 mL Mouth Rinse BID  . atorvastatin  20 mg Oral q1800  . clopidogrel  75 mg Oral Q breakfast  . furosemide  40 mg Oral Daily  . heparin  5,000 Units Subcutaneous 3 times per day  . levothyroxine  25 mcg Oral QAC breakfast  . metoprolol tartrate  12.5 mg Oral BID  . mometasone-formoterol  2 puff Inhalation BID  . sertraline  50 mg Oral Daily  . sodium chloride flush  3 mL Intravenous Q12H    OBJECTIVE  Filed Vitals:   05/23/15 0031 05/23/15 0442 05/23/15 0757 05/23/15 0808  BP: 110/61 118/70 145/97   Pulse: 113 110 102   Temp: 98 F (36.7 C) 97.4 F (36.3 C) 98.7 F (37.1 C)   TempSrc: Oral Oral Oral   Resp: 20  18   Height:      Weight:  150 lb 1.6 oz (68.085 kg)    SpO2: 94% 96% 98% 96%    Intake/Output Summary (Last 24 hours) at 05/23/15 0903 Last data filed at 05/23/15 0853  Gross per 24 hour  Intake    770 ml  Output    776 ml  Net     -6 ml   Filed Weights   05/21/15 0200 05/22/15 1330 05/23/15 0442  Weight: 158 lb 11.7 oz (72 kg) 151 lb 9.6 oz (68.765 kg) 150 lb 1.6 oz (68.085 kg)    PHYSICAL EXAM  General: Pleasant, NAD. Neuro: Alert and oriented X 3. Moves all extremities spontaneously. Psych: Normal affect. HEENT:  Normal  Neck: Supple without bruits or JVD. Lungs:  Resp regular and unlabored, CTA. Heart: RRR no s3, s4, or murmurs. Abdomen: Soft, non-tender, non-distended, BS + x 4.  Extremities: No clubbing, cyanosis or edema. DP/PT/Radials 2+ and equal  bilaterally.  Accessory Clinical Findings  CBC  Recent Labs  05/20/15 1600  05/22/15 0445 05/23/15 0530  WBC 14.8*  < > 7.7 7.0  NEUTROABS 13.1*  --   --   --   HGB 14.2  < > 11.9* 12.0  HCT 44.4  < > 38.7 37.5  MCV 97.6  < > 98.0 99.2  PLT 181  < > 142* 135*  < > = values in this interval not displayed. Basic Metabolic Panel  Recent Labs  05/22/15 0445 05/23/15 0530  NA 141 138  K 3.8 4.3  CL 99* 98*  CO2 31 32  GLUCOSE 143* 142*  BUN 37* 28*  CREATININE 1.55* 1.19*  CALCIUM 8.7* 8.6*   Liver Function Tests  Recent Labs  05/22/15 0445 05/23/15 0530  AST 24 18  ALT 29 23  ALKPHOS 35* 33*  BILITOT 1.4* 1.5*  PROT 6.8 6.5  ALBUMIN 3.4* 3.0*    Recent Labs  05/22/15 0922  TROPONINI <0.03    Recent Labs  05/21/15 0217  HGBA1C 6.4*    Recent Labs  05/21/15 0224  TSH 8.436*    Radiology/Studies  Dg Chest Port 1 View  05/22/2015  CLINICAL DATA:  Pneumonia.  EXAM: PORTABLE CHEST 1 VIEW COMPARISON:  05/20/2015 . FINDINGS: Mediastinum hilar structures are stable. Cardiomegaly with pulmonary vascular prominence and interstitial prominence with bilateral effusions. Findings consistent mild congestive heart failure. Slight interim clearing. Ill-defined infiltrate in the right upper lobe is again noted. This is most consistent pneumonia. Follow-up chest x-rays to demonstrate clearing suggested in order to exclude right upper lobe mass. No pneumothorax. Surgical clip over the right neck. Old right rib fractures. IMPRESSION: 1. Cardiomegaly with pulmonary vascular prominence and mild interstitial prominence with small pleural effusions. Findings consistent with mild congestive heart failure. Slight interim clearing. 2. Persistent right upper lobe infiltrate most consistent with pneumonia. Close follow-up chest x-ray suggested to demonstrate clearing in order to exclude an right upper lobe mass . Electronically Signed   By: Maisie Fus  Register   On: 05/22/2015 07:10    Dg Chest Portable 1 View  05/20/2015  CLINICAL DATA:  Shortness of breath EXAM: PORTABLE CHEST 1 VIEW COMPARISON:  01/24/2015 FINDINGS: Moderate cardiac enlargement. Diffuse interstitial change with more coarse markings in the right lower lobe. Diffusely improved aeration except for right lower lobe. Improved vascular congestion. Small bilateral pleural effusions. IMPRESSION: Improving pulmonary edema with persistent asymmetric opacity right lung base. This could represent asymmetric edema. Pneumonia or pneumonitis in the right lower lobe not excluded. Electronically Signed   By: Esperanza Heir M.D.   On: 05/20/2015 16:58   TELE: a-fib 95-110 BPM    ASSESSMENT AND PLAN  Acute respiratory failure with hypoxia (HCC)  Atrial fibrillation with RVR This is not a new problem. She was in Afib during her admission in October but not addressed. Her rate is currently controlled around 100 BPM. CHADSVASC is 7. Based on weight and Scr she qualifies for eliquis  bid. However, after speaking to PT, she thinks she is a high fall risk so I would not recommend adding anticoagulation. Her left atrium is severely dilated which would make DCCV less likely to be successful. I recommend rate control strategy. She has had one dose of lopressor 12.5mg  at 1038hrs. Continue to monitor. Titrate as BP will allow.    HTN (hypertension) BP a little elevated. Lopressor just added.  CKD (chronic kidney disease) stage 3, GFR 30-59 ml/min SCr better  Acute on chronic diastolic CHF (congestive heart failure), NYHA class 2 (HCC) Net fluids: -0.2L. BNP was 529.3 on 2/21. She looks euvolemic to me.   RLL infiltrate vs aysmetric edema  82 year old female with prior medical history of hypertension, security stage III, actual chronic diastolic CHF, who was admitted with acute respiratory failure with hypoxia and was found to be in atrial fibrillation with RVR. Patient has known  paroxysmal atrial fibrillation. Because of her age, risk fall and history of hemoptysis she is not a "anticoagulation candidate and we'll just continue aspirin. We'll focus on rate control, we will further increase metoprolol to 25 mg po BID as her rate remains in 95-110 BPM.  Signed, Lars Masson MD, [facility] 05/23/2015

## 2015-05-23 NOTE — Progress Notes (Addendum)
[place] TEAM 1 - Stepdown/ICU TEAM PROGRESS NOTE  Yvonne [name] ZOX:[id number] DOB: 07-05-1918 DOA: 05/20/2015 PCP: Gaye Alken, MD  Admit HPI / Brief Narrative: 82 y.o. F ALF resident with history of chronic diastolic CHF (TTE June 2016 EF of 55-60% - grade 2 diastolic dysfunction) who developed shortness of breath and hemoptysis.  In the ED chest x-ray suggested pulmonary edema.  She was noted to have a blood pressure of 178/114, and was started on nitroglycerin drip. She was placed on BiPAP.  HPI/Subjective: Patient appears to be hemodynamically stable, no chest pain or shortness of breath, currently requiring 4 L of oxygen,desaturation on room air during ambulation to 88%, replaced 2 liters improved to 93%.,Patient with HR elevation 130-140s with activity and noted DOE   Assessment/Plan:  Acute respiratory failure with hypoxia Presented with oxygen saturation 84% on room air - possibly secondary to acute congestive heart failure versus right lower lobe pneumonia - required BiPAP initially - now off BiPAP and doing remarkably well. Repeat chest x-ray on 2/23 shows persistent right upper lobe infiltrate consistent with pneumonia. 2-D echo results pending   Paroxysmal atrial fib with RVR, seen initially on an EKG in October 2016 Patient evaluated by cardiology,CHADSVASC is 7 given her fall risk and age she is not a candidate for anticoagulation, patient also wants to be minimalistic in her approach Cardiology consulted, repeat echo pending, try to achieve rate control with beta blocker, not a candidate for cardioversion TSH 8.4  Left foot pain Uric acid to rule out gout, check plain films  RLL infiltrate v/s asymmetric edema  No strong signs of infection presently but will need to follow RLL findings - cycle procalcitonin -  patient received 1 dose of vancomycin and Zosyn. Restart patient on antibiotics, namely Unasyn for possible aspiration pneumonia. Speech therapy  consultation recommends regular diet and thin liquids   Acute exacerbation of chronic diastolic CHF Flash pulmonary edema possibly cause of presenting symptoms -, rule out acute coronary syndrome, continue to cycle cardiac enzymes, no other sign of significant volume overload presently -resume home dose of Lasix 40 milligrams in the morning and 20 mg in the evening  CKD stage III Creatinine baseline is 1.4 - crt is climbing -  follow closely, could be elevated in the setting of hypertensive urgency  Hypertension - HTN Urgency  improved w/ nitroglycerin drip, but then developed hypotension therefore now off nitro - BP reasonably controlled - avoid overaggressive correction given advanced age , resume Lasix.  Elevated TSH    FT4 0.69  COPD Well compensated at present with no wheeze  Hx of CVA  Code Status: NO CODE - DNR Family Communication: Discussed with the patient by the bedside Disposition Plan: Continue telemetry, SNF placement, will place social work consultation  Consultants: none  Procedures: none  Antibiotics: Zosyn 2/21  Vanc 2/21   DVT prophylaxis: SQ heparin   Objective: Blood pressure 145/97, pulse 102, temperature 98.7 F (37.1 C), temperature source Oral, resp. rate 18, height 5\' 5"  (1.651 m), weight 68.085 kg (150 lb 1.6 oz), SpO2 96 %.  Intake/Output Summary (Last 24 hours) at 05/23/15 1009 Last data filed at 05/23/15 0853  Gross per 24 hour  Intake    770 ml  Output    776 ml  Net     -6 ml    Exam: General: Pleasant, NAD. Neuro: Alert and oriented X 3. Moves all extremities spontaneously. Psych: Normal affect. HEENT: Normal Neck: Supple without bruits or JVD. Lungs:  Resp regular and unlabored, CTA. Heart: RRR no s3, s4, or murmurs. Abdomen: Soft, non-tender, non-distended, BS + x 4.  Extremities: No clubbing, cyanosis or edema. DP/PT/Radials 2+ and equal bilaterally. Data Reviewed:  Basic Metabolic Panel:  Recent Labs Lab  05/20/15 1600 05/20/15 1638 05/21/15 0224 05/22/15 0445 05/23/15 0530  NA 139 137 142 141 138  K 4.3 5.7* 4.8 3.8 4.3  CL 99* 98* 99* 99* 98*  CO2 28  --  28 31 32  GLUCOSE 154* 146* 131* 143* 142*  BUN 35* 59* 38* 37* 28*  CREATININE 1.49* 1.30* 1.74* 1.55* 1.19*  CALCIUM 9.0  --  9.0 8.7* 8.6*    CBC:  Recent Labs Lab 05/20/15 1600 05/20/15 1638 05/21/15 0224 05/22/15 0445 05/23/15 0530  WBC 14.8*  --  10.4 7.7 7.0  NEUTROABS 13.1*  --   --   --   --   HGB 14.2 17.0* 12.0 11.9* 12.0  HCT 44.4 50.0* 37.7 38.7 37.5  MCV 97.6  --  96.4 98.0 99.2  PLT 181  --  145* 142* 135*    Liver Function Tests:  Recent Labs Lab 05/20/15 1600 05/22/15 0445 05/23/15 0530  AST 63* 24 18  ALT 41 29 23  ALKPHOS 44 35* 33*  BILITOT 1.7* 1.4* 1.5*  PROT 7.8 6.8 6.5  ALBUMIN 4.1 3.4* 3.0*   Coags:  Recent Labs Lab 05/20/15 1600 05/21/15 0224  INR 1.15 1.29    Recent Results (from the past 240 hour(s))  Blood culture (routine x 2)     Status: None (Preliminary result)   Collection Time: 05/20/15  6:05 PM  Result Value Ref Range Status   Specimen Description BLOOD RIGHT HAND  Final   Special Requests BOTTLES DRAWN AEROBIC AND ANAEROBIC 5CC  Final   Culture NO GROWTH 2 DAYS  Final   Report Status PENDING  Incomplete  Blood culture (routine x 2)     Status: None (Preliminary result)   Collection Time: 05/20/15  6:10 PM  Result Value Ref Range Status   Specimen Description BLOOD LEFT HAND  Final   Special Requests BOTTLES DRAWN AEROBIC AND ANAEROBIC 5CC  Final   Culture NO GROWTH 2 DAYS  Final   Report Status PENDING  Incomplete  MRSA PCR Screening     Status: None   Collection Time: 05/21/15  2:09 AM  Result Value Ref Range Status   MRSA by PCR NEGATIVE NEGATIVE Final    Comment:        The GeneXpert MRSA Assay (FDA approved for NASAL specimens only), is one component of a comprehensive MRSA colonization surveillance program. It is not intended to diagnose  MRSA infection nor to guide or monitor treatment for MRSA infections.      Studies:   Recent x-ray studies have been reviewed in detail by the Attending Physician  Scheduled Meds:  Scheduled Meds: . ampicillin-sulbactam (UNASYN) IV  1.5 g Intravenous Q12H  . antiseptic oral rinse  7 mL Mouth Rinse BID  . atorvastatin  20 mg Oral q1800  . clopidogrel  75 mg Oral Q breakfast  . furosemide  40 mg Oral Daily  . heparin  5,000 Units Subcutaneous 3 times per day  . levothyroxine  25 mcg Oral QAC breakfast  . metoprolol tartrate  25 mg Oral BID  . mometasone-formoterol  2 puff Inhalation BID  . sertraline  50 mg Oral Daily  . sodium chloride flush  3 mL Intravenous Q12H    Time spent  on care of this patient: 35 mins   Richarda Overlie , MD   [facility] Office  [phone] Pager - Text Page per Loretha Stapler as per below:  On-Call/Text Page:      [email or web address]      password [email or web address]  If 7PM-7AM, please contact night-coverage [email or web address] Password [email or web address] 05/23/2015, 10:09 AM   LOS: 3 days

## 2015-05-23 NOTE — Progress Notes (Signed)
Echocardiogram 2D Echocardiogram has been performed.  05/23/2015 10:56 AM Gertie Fey, RVT, RDCS, RDMS

## 2015-05-23 NOTE — Care Management Important Message (Signed)
Important Message  Patient Details  Name: Yvonne [name] MRN: [id number] Date of Birth: 02/12/1919   Medicare Important Message Given:  Yes    Kazuo Durnil P Jonel Weldon 05/23/2015, 11:59 AM

## 2015-05-23 NOTE — Evaluation (Signed)
Occupational Therapy Evaluation Patient Details Name: Yvonne [name] MRN: [id number] DOB: July 05, 1918 Today's Date: 05/23/2015    History of Present Illness 82 y.o. F ALF resident with history of COPD, CVA, chronic diastolic CHF (TTE June 2016 EF of 55-60% - grade 2 diastolic dysfunction) who developed shortness of breath and hemoptysis. CXR with RLL infiltrate v/s asymmetric edema    Clinical Impression   Pt required frequent rest breaks with mobility and reports "i can' just tell I can't breath as good. I stop and then keep going." "i was fine until I hit my 90s. I am 89 or 89." Ot states "it says here on your wrist you are 82 yo" Pt states "oh yeah that's me 82 yo, honey it doesn't matter any more. " Pt c/o L LE foot pain on dorsal of her foot. Pt with pain in bil bicep/ triceps from heavier use of RW to keep weight off L LE.     Follow Up Recommendations  SNF    Equipment Recommendations  Other (comment) (RW)    Recommendations for Other Services       Precautions / Restrictions Precautions Precautions: Fall      Mobility Bed Mobility Overal bed mobility: Modified Independent                Transfers Overall transfer level: Needs assistance Equipment used: Rolling walker (2 wheeled) Transfers: Sit to/from Stand Sit to Stand: Min guard              Balance                                            ADL Overall ADL's : Needs assistance/impaired     Grooming: Wash/dry hands;Min guard               Lower Body Dressing: Supervision/safety;Sit to/from stand Lower Body Dressing Details (indicate cue type and reason): doff socks, don socks from home and shoes at eob. pt with difficult with L LE due to pain and edema Toilet Transfer: Min guard           Functional mobility during ADLs: Min guard General ADL Comments: pt c/o pain on L LE and now with BIL UE bicep fatigue from keeping weight off foot with bil Ue      Vision     Perception     Praxis      Pertinent Vitals/Pain Pain Assessment: Faces Faces Pain Scale: Hurts whole lot Pain Location: neck , L LE dorsal aspect of foot Pain Descriptors / Indicators: Constant Pain Intervention(s): Repositioned;Monitored during session     Hand Dominance Right   Extremity/Trunk Assessment Upper Extremity Assessment Upper Extremity Assessment: Generalized weakness   Lower Extremity Assessment Lower Extremity Assessment: LLE deficits/detail LLE Deficits / Details: c/o pain on dorsal aspect of foot slight edema noted   Cervical / Trunk Assessment Cervical / Trunk Assessment: Kyphotic   Communication Communication Communication: HOH   Cognition Arousal/Alertness: Awake/alert Behavior During Therapy: WFL for tasks assessed/performed Overall Cognitive Status: History of cognitive impairments - at baseline Area of Impairment: Awareness;Memory;Safety/judgement     Memory: Decreased short-term memory   Safety/Judgement: Decreased awareness of deficits;Decreased awareness of safety Awareness: Emergent   General Comments: pt asking after MD Abrol leaves "who was that ?" "what is she going to do?" Pt also unable to hear all the information being provided  due to [facility]. `   General Comments       Exercises       Shoulder Instructions      Home Living Family/patient expects to be discharged to:: Assisted living                             Home Equipment: Walker - 2 wheels   Additional Comments: walk in shower with standard height toilet      Prior Functioning/Environment Level of Independence: Independent with assistive device(s)        Comments: uses RW for ambulation    OT Diagnosis: Generalized weakness;Acute pain   OT Problem List: Decreased activity tolerance;Decreased safety awareness;Decreased knowledge of use of DME or AE;Pain;Impaired balance (sitting and/or standing);Decreased strength;Decreased knowledge  of precautions;Cardiopulmonary status limiting activity   OT Treatment/Interventions: Self-care/ADL training;Therapeutic exercise;DME and/or AE instruction;Energy conservation;Therapeutic activities;Cognitive remediation/compensation;Patient/family education;Balance training    OT Goals(Current goals can be found in the care plan section) Acute Rehab OT Goals Patient Stated Goal: to return to ALF OT Goal Formulation: Patient unable to participate in goal setting Time For Goal Achievement: 06/06/15 Potential to Achieve Goals: Good  OT Frequency: Min 2X/week   Barriers to D/C:            Co-evaluation              End of Session Equipment Utilized During Treatment: Gait belt;Rolling walker;Oxygen Nurse Communication: Mobility status;Precautions  Activity Tolerance: Patient tolerated treatment well Patient left: in bed;with call bell/phone within reach;with bed alarm set   Time: 1201-1228 OT Time Calculation (min): 27 min Charges:  OT General Charges $OT Visit: 1 Procedure OT Evaluation $OT Eval Moderate Complexity: 1 Procedure OT Treatments $Therapeutic Activity: 8-22 mins G-Codes:    Harolyn Rutherford 01-Jun-2015, 1:27 PM  Mateo Flow   OTR/L Pager: [phone] Office: [phone] .

## 2015-05-24 ENCOUNTER — Inpatient Hospital Stay (HOSPITAL_COMMUNITY): Payer: Medicare Other

## 2015-05-24 DIAGNOSIS — J189 Pneumonia, unspecified organism: Secondary | ICD-10-CM | POA: Insufficient documentation

## 2015-05-24 DIAGNOSIS — M25562 Pain in left knee: Secondary | ICD-10-CM | POA: Insufficient documentation

## 2015-05-24 DIAGNOSIS — J690 Pneumonitis due to inhalation of food and vomit: Secondary | ICD-10-CM

## 2015-05-24 LAB — CBC
HEMATOCRIT: 37.9 % (ref 36.0–46.0)
Hemoglobin: 11.8 g/dL — ABNORMAL LOW (ref 12.0–15.0)
MCH: 30.6 pg (ref 26.0–34.0)
MCHC: 31.1 g/dL (ref 30.0–36.0)
MCV: 98.2 fL (ref 78.0–100.0)
Platelets: 152 10*3/uL (ref 150–400)
RBC: 3.86 MIL/uL — ABNORMAL LOW (ref 3.87–5.11)
RDW: 16.5 % — AB (ref 11.5–15.5)
WBC: 6.2 10*3/uL (ref 4.0–10.5)

## 2015-05-24 LAB — COMPREHENSIVE METABOLIC PANEL
ALT: 21 U/L (ref 14–54)
ANION GAP: 12 (ref 5–15)
AST: 16 U/L (ref 15–41)
Albumin: 3.1 g/dL — ABNORMAL LOW (ref 3.5–5.0)
Alkaline Phosphatase: 37 U/L — ABNORMAL LOW (ref 38–126)
BILIRUBIN TOTAL: 1.3 mg/dL — AB (ref 0.3–1.2)
BUN: 28 mg/dL — AB (ref 6–20)
CO2: 31 mmol/L (ref 22–32)
Calcium: 8.7 mg/dL — ABNORMAL LOW (ref 8.9–10.3)
Chloride: 98 mmol/L — ABNORMAL LOW (ref 101–111)
Creatinine, Ser: 1.25 mg/dL — ABNORMAL HIGH (ref 0.44–1.00)
GFR calc Af Amer: 41 mL/min — ABNORMAL LOW (ref >60)
GFR, EST NON AFRICAN AMERICAN: 36 mL/min — AB (ref >60)
Glucose, Bld: 136 mg/dL — ABNORMAL HIGH (ref 65–99)
POTASSIUM: 3.5 mmol/L (ref 3.5–5.1)
Sodium: 141 mmol/L (ref 135–145)
TOTAL PROTEIN: 6.9 g/dL (ref 6.5–8.1)

## 2015-05-24 MED ORDER — COLCHICINE 0.6 MG PO TABS
0.6000 mg | ORAL_TABLET | Freq: Every day | ORAL | Status: DC
Start: 2015-05-24 — End: 2015-05-25
  Administered 2015-05-24 – 2015-05-25 (×2): 0.6 mg via ORAL
  Filled 2015-05-24 (×2): qty 1

## 2015-05-24 MED ORDER — METOPROLOL TARTRATE 25 MG PO TABS
37.5000 mg | ORAL_TABLET | Freq: Two times a day (BID) | ORAL | Status: DC
  Administered 2015-05-24 – 2015-05-26 (×4): 37.5 mg via ORAL
  Filled 2015-05-24 (×3): qty 1

## 2015-05-24 NOTE — Progress Notes (Signed)
Patient Name: Yvonne [name] Date of Encounter: 05/24/2015  Principal Problem:   Acute respiratory failure with hypoxia (HCC) Active Problems:   HTN (hypertension)   CKD (chronic kidney disease) stage 3, GFR 30-59 ml/min   Acute on chronic diastolic CHF (congestive heart failure), NYHA class 2 (HCC)   Length of Stay: 4  SUBJECTIVE  Denies palpitations or SOB.  CURRENT MEDS . ampicillin-sulbactam (UNASYN) IV  1.5 g Intravenous Q12H  . antiseptic oral rinse  7 mL Mouth Rinse BID  . atorvastatin  20 mg Oral q1800  . clopidogrel  75 mg Oral Q breakfast  . furosemide  20 mg Oral Q1500  . furosemide  40 mg Oral Daily  . heparin  5,000 Units Subcutaneous 3 times per day  . levothyroxine  25 mcg Oral QAC breakfast  . metoprolol tartrate  25 mg Oral BID  . mometasone-formoterol  2 puff Inhalation BID  . sertraline  50 mg Oral Daily  . sodium chloride flush  3 mL Intravenous Q12H    OBJECTIVE  Filed Vitals:   05/23/15 1918 05/23/15 2011 05/24/15 0414 05/24/15 0739  BP:  137/55 131/61   Pulse: 112 88 106   Temp:  98 F (36.7 C) 97.9 F (36.6 C)   TempSrc:  Oral Oral   Resp: Height:      Weight:   150 lb 5.7 oz (68.2 kg)   SpO2: 97% 98% 98% 93%    Intake/Output Summary (Last 24 hours) at 05/24/15 1022 Last data filed at 05/24/15 0517  Gross per 24 hour  Intake    940 ml  Output   1100 ml  Net   -160 ml   Filed Weights   05/22/15 1330 05/23/15 0442 05/24/15 0414  Weight: 151 lb 9.6 oz (68.765 kg) 150 lb 1.6 oz (68.085 kg) 150 lb 5.7 oz (68.2 kg)    PHYSICAL EXAM  General: Pleasant, NAD. Neuro: Alert and oriented X 3. Moves all extremities spontaneously. Psych: Normal affect. HEENT:  Normal  Neck: Supple without bruits or JVD. Lungs:  Resp regular and unlabored, CTA. Heart: RRR no s3, s4, or murmurs. Abdomen: Soft, non-tender, non-distended, BS + x 4.  Extremities: No clubbing, cyanosis or edema. DP/PT/Radials 2+ and equal  bilaterally.  Accessory Clinical Findings  CBC  Recent Labs  05/23/15 0530 05/24/15 0350  WBC 7.0 6.2  HGB 12.0 11.8*  HCT 37.5 37.9  MCV 99.2 98.2  PLT 135* 152   Basic Metabolic Panel  Recent Labs  05/23/15 0530 05/24/15 0350  NA 138 141  K 4.3 3.5  CL 98* 98*  CO2 32 31  GLUCOSE 142* 136*  BUN 28* 28*  CREATININE 1.19* 1.25*  CALCIUM 8.6* 8.7*   Liver Function Tests  Recent Labs  05/23/15 0530 05/24/15 0350  AST 18 16  ALT 23 21  ALKPHOS 33* 37*  BILITOT 1.5* 1.3*  PROT 6.5 6.9  ALBUMIN 3.0* 3.1*    Recent Labs  05/22/15 0922  TROPONINI <0.03   No results for input(s): HGBA1C in the last 72 hours. No results for input(s): TSH, T4TOTAL, T3FREE, THYROIDAB in the last 72 hours.  Invalid input(s): FREET3  Radiology/Studies  Dg Chest Port 1 View  05/22/2015  CLINICAL DATA:  Pneumonia. EXAM: PORTABLE CHEST 1 VIEW COMPARISON:  05/20/2015 . FINDINGS: Mediastinum hilar structures are stable. Cardiomegaly with pulmonary vascular prominence and interstitial prominence with bilateral effusions. Findings consistent mild congestive heart failure. Slight interim clearing. Ill-defined  infiltrate in the right upper lobe is again noted. This is most consistent pneumonia. Follow-up chest x-rays to demonstrate clearing suggested in order to exclude right upper lobe mass. No pneumothorax. Surgical clip over the right neck. Old right rib fractures. IMPRESSION: 1. Cardiomegaly with pulmonary vascular prominence and mild interstitial prominence with small pleural effusions. Findings consistent with mild congestive heart failure. Slight interim clearing. 2. Persistent right upper lobe infiltrate most consistent with pneumonia. Close follow-up chest x-ray suggested to demonstrate clearing in order to exclude an right upper lobe mass . Electronically Signed   By: Maisie Fus  Register   On: 05/22/2015 07:10   Dg Chest Portable 1 View  05/20/2015  CLINICAL DATA:  Shortness of breath  EXAM: PORTABLE CHEST 1 VIEW COMPARISON:  01/24/2015 FINDINGS: Moderate cardiac enlargement. Diffuse interstitial change with more coarse markings in the right lower lobe. Diffusely improved aeration except for right lower lobe. Improved vascular congestion. Small bilateral pleural effusions. IMPRESSION: Improving pulmonary edema with persistent asymmetric opacity right lung base. This could represent asymmetric edema. Pneumonia or pneumonitis in the right lower lobe not excluded. Electronically Signed   By: Esperanza Heir M.D.   On: 05/20/2015 16:58   Dg Foot Complete Left  05/23/2015  CLINICAL DATA:  Left foot pain across top of left foot.  No injury. EXAM: LEFT FOOT - COMPLETE 3+ VIEW COMPARISON:  None. FINDINGS: Mild degenerative changes in the left first tarsometatarsal joint and metatarsal phalangeal joint. Plantar calcaneal spur. No acute bony abnormality. Specifically, no fracture, subluxation, or dislocation. Soft tissues are intact. IMPRESSION: No acute bony abnormality. Electronically Signed   By: Charlett Nose M.D.   On: 05/23/2015 13:42   TELE: a-fib 95-110 BPM    ASSESSMENT AND PLAN  Acute respiratory failure with hypoxia (HCC)  Atrial fibrillation with RVR This is not a new problem. She was in Afib during her admission in October but not addressed. Her rate is currently controlled around 100 BPM. CHADSVASC is 7. Based on weight and Scr she qualifies for eliquis  bid. However, after speaking to PT, she thinks she is a high fall risk so I would not recommend adding anticoagulation. Her left atrium is severely dilated which would make DCCV less likely to be successful. will likely be good for rate control strategy.  She has room for increasing metoprolol, will increase to 50 mg BID.  HTN (hypertension) BP a little elevated. Lopressor just added.  Will titrate up.  CKD (chronic kidney disease) stage 3, GFR 30-59 ml/min SCr better  Acute on chronic  diastolic CHF (congestive heart failure), NYHA class 2 (HCC) Net fluids: -0.2L. BNP was 529.3 on 2/21. She looks euvolemic to me.   RLL infiltrate vs aysmetric edema  Signed, Will Jorja Loa MD 05/24/2015

## 2015-05-24 NOTE — Progress Notes (Signed)
[facility] Progress Note  Patient: Yvonne [name]:[id number]   PCP: Gaye Alken, MD DOB: 11-Apr-1918   DOA: 05/20/2015   DOS: 05/24/2015   Date of Service: the patient was seen and examined on 05/24/2015  Subjective: Patient complains of left knee pain on the medial aspect. She mentions this has just started today. She did complain of left heel pain yesterday and underwent an x-ray of the foot yesterday. She denies any nausea or vomiting breathing is better. Nutrition: Tolerating oral diet Activity: Sitting in the room Last BM: 05/22/2015  Assessment and Plan: Acute respiratory failure with hypoxia to pneumonia  Presented with oxygen saturation 84% on room air - possibly secondary to acute congestive heart failure versus right lower lobe pneumonia - required BiPAP initially - now off BiPAP and doing remarkably well. Repeat chest x-ray on 2/23 shows persistent right upper lobe infiltrate consistent with pneumonia. patient currently on Unasyn   Paroxysmal atrial fib with RVR, seen initially on an EKG in October 2016 Acute on chronic diastolic dysfunction  Patient evaluated by cardiology,CHADSVASC is 7 given her fall risk and age she is not a candidate for anticoagulation, patient also wants to be minimalistic in her approach Cardiology consulted, repeat echo  normal ejection fraction  achieve rate control with beta blocker, not a candidate for cardioversion TSH 8.4  currently on oral Lasix 40 mg and 20 mg twice a day  Left foot pain Uric acid  elevated but no evidence of gout, patient today complains of left knee pain check plain films  RLL infiltrate v/s asymmetric edema  No strong signs of infection presently but will need to follow RLL findings - cycle procalcitonin - patient received 1 dose of vancomycin and Zosyn. Restart patient on antibiotics, namely Unasyn for possible aspiration pneumonia. Speech therapy consultation recommends regular diet and thin  liquids  CKD stage III Creatinine baseline is 1.4 - crt is climbing - follow closely, could be elevated in the setting of hypertensive urgency  Hypertension - HTN Urgency  improved w/ nitroglycerin drip, but then developed hypotension therefore now off nitro - BP reasonably controlled - avoid overaggressive correction given advanced age , resume Lasix.  Elevated TSH  FT4 0.69  COPD Well compensated at present with no wheeze  DVT Prophylaxis: subcutaneous Heparin Nutrition: Cardiac diet  Advance goals of care discussioDNR/DNI  Brief Summary of Hospitalization:  Procedures: Echocardiogram, ejection fraction 60-65%  Consultants: cardiology Antibiotics: Anti-infectives    Start     Dose/Rate Route Frequency Ordered Stop   05/23/15 1600  ampicillin-sulbactam (UNASYN) 1.5 g in sodium chloride 0.9 % 50 mL IVPB     1.5 g 100 mL/hr over 30 Minutes Intravenous Every 12 hours 05/23/15 1112     05/22/15 0900  ampicillin-sulbactam (UNASYN) 1.5 g in sodium chloride 0.9 % 50 mL IVPB  Status:  Discontinued     1.5 g 100 mL/hr over 30 Minutes Intravenous Every 12 hours 05/22/15 0855 05/23/15 1112   05/20/15 1745  vancomycin (VANCOCIN) IVPB 1000 mg/200 mL premix     1,000 mg 200 mL/hr over 60 Minutes Intravenous  Once 05/20/15 1735 05/20/15 2015   05/20/15 1745  piperacillin-tazobactam (ZOSYN) IVPB 3.375 g     3.375 g 100 mL/hr over 30 Minutes Intravenous  Once 05/20/15 1735 05/20/15 1903       Family Communication: no family was present at bedside, at the time of interview.   Disposition:  Expected discharge date: 05/26/2015  Barriers to safe discharge: improvement in her  respiratory status    Intake/Output Summary (Last 24 hours) at 05/24/15 1425 Last data filed at 05/24/15 1000  Gross per 24 hour  Intake   1300 ml  Output   1100 ml  Net    200 ml   Filed Weights   05/22/15 1330 05/23/15 0442 05/24/15 0414  Weight: 68.765 kg (151 lb 9.6 oz) 68.085 kg (150 lb 1.6 oz) 68.2 kg  (150 lb 5.7 oz)    Objective: Physical Exam: Filed Vitals:   05/23/15 2011 05/24/15 0414 05/24/15 0739 05/24/15 1021  BP: 137/55 131/61  101/59  Pulse: 88 106  111  Temp: 98 F (36.7 C) 97.9 F (36.6 C)  97.9 F (36.6 C)  TempSrc: Oral Oral    Resp: 18 18  18   Height:      Weight:  68.2 kg (150 lb 5.7 oz)    SpO2: 98% 98% 93% 94%     General: Appear in mild distress, no Rash; Oral Mucosa moist. Cardiovascular: S1 and S2 Present, no Murmur, no JVD Respiratory: Bilateral Air entry present and basal Crackles, occasssional wheezes Abdomen: Bowel Sound present, Soft and no tenderness Extremities: trace Pedal edema, no calf tenderness Neurology: Grossly no focal neuro deficit.  Data Reviewed: CBC:  Recent Labs Lab 05/20/15 1600 05/20/15 1638 05/21/15 0224 05/22/15 0445 05/23/15 0530 05/24/15 0350  WBC 14.8*  --  10.4 7.7 7.0 6.2  NEUTROABS 13.1*  --   --   --   --   --   HGB 14.2 17.0* 12.0 11.9* 12.0 11.8*  HCT 44.4 50.0* 37.7 38.7 37.5 37.9  MCV 97.6  --  96.4 98.0 99.2 98.2  PLT 181  --  145* 142* 135* 152   Basic Metabolic Panel:  Recent Labs Lab 05/20/15 1600 05/20/15 1638 05/21/15 0224 05/22/15 0445 05/23/15 0530 05/24/15 0350  NA 139 137 142 141 138 141  K 4.3 5.7* 4.8 3.8 4.3 3.5  CL 99* 98* 99* 99* 98* 98*  CO2 28  --  28 31 32 31  GLUCOSE 154* 146* 131* 143* 142* 136*  BUN 35* 59* 38* 37* 28* 28*  CREATININE 1.49* 1.30* 1.74* 1.55* 1.19* 1.25*  CALCIUM 9.0  --  9.0 8.7* 8.6* 8.7*   Liver Function Tests:  Recent Labs Lab 05/20/15 1600 05/22/15 0445 05/23/15 0530 05/24/15 0350  AST 63* 24 18 16   ALT 41 29 23 21   ALKPHOS 44 35* 33* 37*  BILITOT 1.7* 1.4* 1.5* 1.3*  PROT 7.8 6.8 6.5 6.9  ALBUMIN 4.1 3.4* 3.0* 3.1*   No results for input(s): LIPASE, AMYLASE in the last 168 hours. No results for input(s): AMMONIA in the last 168 hours.  Cardiac Enzymes:  Recent Labs Lab 05/22/15 0922  TROPONINI <0.03    BNP (last 3  results)  Recent Labs  01/24/15 0503 01/24/15 1122 05/20/15 1600  BNP 1013.9* 862.9* 529.3*    CBG: No results for input(s): GLUCAP in the last 168 hours.  Recent Results (from the past 240 hour(s))  Blood culture (routine x 2)     Status: None (Preliminary result)   Collection Time: 05/20/15  6:05 PM  Result Value Ref Range Status   Specimen Description BLOOD RIGHT HAND  Final   Special Requests BOTTLES DRAWN AEROBIC AND ANAEROBIC 5CC  Final   Culture NO GROWTH 4 DAYS  Final   Report Status PENDING  Incomplete  Blood culture (routine x 2)     Status: None (Preliminary result)   Collection Time: 05/20/15  6:10 PM  Result Value Ref Range Status   Specimen Description BLOOD LEFT HAND  Final   Special Requests BOTTLES DRAWN AEROBIC AND ANAEROBIC 5CC  Final   Culture NO GROWTH 4 DAYS  Final   Report Status PENDING  Incomplete  MRSA PCR Screening     Status: None   Collection Time: 05/21/15  2:09 AM  Result Value Ref Range Status   MRSA by PCR NEGATIVE NEGATIVE Final    Comment:        The GeneXpert MRSA Assay (FDA approved for NASAL specimens only), is one component of a comprehensive MRSA colonization surveillance program. It is not intended to diagnose MRSA infection nor to guide or monitor treatment for MRSA infections.      Studies: Dg Knee Complete 4 Views Left  05/24/2015  CLINICAL DATA:  Left knee pain, no known injury, initial encounter EXAM: LEFT KNEE - COMPLETE 4+ VIEW COMPARISON:  06/20/2008 FINDINGS: No acute fracture or dislocation is seen. No joint effusion is noted. Diffuse vascular calcifications are seen. IMPRESSION: No acute abnormality noted. Electronically Signed   By: Alcide Clever M.D.   On: 05/24/2015 14:15     Scheduled Meds: . ampicillin-sulbactam (UNASYN) IV  1.5 g Intravenous Q12H  . antiseptic oral rinse  7 mL Mouth Rinse BID  . atorvastatin  20 mg Oral q1800  . clopidogrel  75 mg Oral Q breakfast  . furosemide  20 mg Oral Q1500  .  furosemide  40 mg Oral Daily  . heparin  5,000 Units Subcutaneous 3 times per day  . levothyroxine  25 mcg Oral QAC breakfast  . metoprolol tartrate  37.5 mg Oral BID  . mometasone-formoterol  2 puff Inhalation BID  . sertraline  50 mg Oral Daily  . sodium chloride flush  3 mL Intravenous Q12H   Continuous Infusions: . sodium chloride 10 mL/hr at 05/21/15 0101   PRN Meds: acetaminophen **OR** acetaminophen, albuterol, docusate sodium, HYDROcodone-acetaminophen, morphine injection, ondansetron **OR** ondansetron (ZOFRAN) IV  Time spent: 30 minutes  Author: Lynden Oxford, MD [facility] Pager: [phone] 05/24/2015 2:25 PM  If 7PM-7AM, please contact night-coverage at [email or web address], password [facility]

## 2015-05-25 DIAGNOSIS — M25562 Pain in left knee: Secondary | ICD-10-CM

## 2015-05-25 LAB — CBC
HCT: 35.9 % — ABNORMAL LOW (ref 36.0–46.0)
Hemoglobin: 11.6 g/dL — ABNORMAL LOW (ref 12.0–15.0)
MCH: 31.4 pg (ref 26.0–34.0)
MCHC: 32.3 g/dL (ref 30.0–36.0)
MCV: 97.3 fL (ref 78.0–100.0)
PLATELETS: 143 10*3/uL — AB (ref 150–400)
RBC: 3.69 MIL/uL — AB (ref 3.87–5.11)
RDW: 16.2 % — AB (ref 11.5–15.5)
WBC: 6 10*3/uL (ref 4.0–10.5)

## 2015-05-25 LAB — RENAL FUNCTION PANEL
ALBUMIN: 2.8 g/dL — AB (ref 3.5–5.0)
Anion gap: 12 (ref 5–15)
BUN: 29 mg/dL — AB (ref 6–20)
CO2: 30 mmol/L (ref 22–32)
CREATININE: 1.29 mg/dL — AB (ref 0.44–1.00)
Calcium: 8.6 mg/dL — ABNORMAL LOW (ref 8.9–10.3)
Chloride: 100 mmol/L — ABNORMAL LOW (ref 101–111)
GFR calc Af Amer: 40 mL/min — ABNORMAL LOW (ref >60)
GFR, EST NON AFRICAN AMERICAN: 34 mL/min — AB (ref >60)
GLUCOSE: 135 mg/dL — AB (ref 65–99)
PHOSPHORUS: 4 mg/dL (ref 2.5–4.6)
POTASSIUM: 3.7 mmol/L (ref 3.5–5.1)
SODIUM: 142 mmol/L (ref 135–145)

## 2015-05-25 LAB — CULTURE, BLOOD (ROUTINE X 2)
CULTURE: NO GROWTH
Culture: NO GROWTH

## 2015-05-25 LAB — PROCALCITONIN: Procalcitonin: 0.32 ng/mL

## 2015-05-25 MED ORDER — SENNA 8.6 MG PO TABS
1.0000 | ORAL_TABLET | Freq: Once | ORAL | Status: AC
  Administered 2015-05-25: 8.6 mg via ORAL
  Filled 2015-05-25: qty 1

## 2015-05-25 MED ORDER — COLCHICINE 0.6 MG PO TABS
0.3000 mg | ORAL_TABLET | Freq: Every day | ORAL | Status: DC
  Administered 2015-05-26: 0.3 mg via ORAL
  Filled 2015-05-25: qty 1

## 2015-05-25 MED ORDER — LEVOFLOXACIN 750 MG PO TABS
750.0000 mg | ORAL_TABLET | ORAL | Status: DC
  Administered 2015-05-25: 750 mg via ORAL
  Filled 2015-05-25: qty 1

## 2015-05-25 NOTE — Progress Notes (Signed)
Remains in AF, rate controlled.  Increased metoprolol yesterday, now with better BP control.  Goal HR 90-110.  Loman Brooklyn, MD

## 2015-05-25 NOTE — Clinical Social Work Note (Signed)
Clinical Social Work Assessment  Patient Details  Name: Yvonne [name] MRN: [id number] Date of Birth: 1918/09/13  Date of referral:  05/25/15               Reason for consult:  Discharge Planning                Permission sought to share information with:  Family Supports, Magazine features editor, Case Manager Permission granted to share information::  Yes, Verbal Permission Granted  Name::      Darlina Sicilian )  Agency::   (ALF, [facility] )  Relationship::   (Friend/HCPOA)  Contact Information:   [phone])  Housing/Transportation Living arrangements for the past 2 months:  Assisted Living Facility Source of Information:  Power of Attorney Patient Interpreter Needed:  None Criminal Activity/Legal Involvement Pertinent to Current Situation/Hospitalization:  No - Comment as needed Significant Relationships:  Friend Lives with:  Facility Resident Do you feel safe going back to the place where you live?  Yes Need for family participation in patient care:  No (Coment)  Care giving concerns: Patient admitted from ALF, [facility].    Social Worker assessment / plan:  Visual merchandiser spoke with patient's HCPOA/Friend, Darlina Sicilian in reference to post-acute placement for SNF. CSW introduced CSW role and SNF process. Pt's HCPOA stated that she and patient are NOT interested in SNF placement and prefers for patient to return to ALF, [facility] once medically stable for discharge. Pt's HCPOA stated patient is comfortable where she is and misses her dog terribly. Pt's HCPOA further reported that patient is happy at [facility] and she would not want to change that. CSW has contacted [facility] to review clinicals for pt's return on Monday, 2/27. [facility] has assisted and memory care but not skilled. HCPOA does not believe PT at SNF will make a difference in pt's functioning and reported that [facility] can arrange therapy. No further concerns reported at this time.  CSW will continue to follow pt and pt's family for continued support and discharge needs once medically stable.   Employment status:  Retired Database administrator PT Recommendations:  Skilled Nursing Facility Information / Referral to community resources:   (NONE )  Patient/Family's Response to care:  Patient alert and oriented x4. Pt and HCPOA agreeable to returning to ALF, [facility] however will NOT consider SNF placement at this time. CSW has NOT received permission to fax referrals to SNF's. Pt's HCPOA polite and appreciated social work intervention.  Patient/Family's Understanding of and Emotional Response to Diagnosis, Current Treatment, and Prognosis:  Pt's HCPOA strongly involved in pt's care and aware of medical interventions.   Emotional Assessment Appearance:  Appears younger than stated age Attitude/Demeanor/Rapport:   (Pleasant ) Affect (typically observed):  Accepting, Calm, Pleasant Orientation:  Oriented to Self, Oriented to Place, Oriented to  Time, Oriented to Situation Alcohol / Substance use:  Not Applicable Psych involvement (Current and /or in the community):  No (Comment)  Discharge Needs  Concerns to be addressed:  Patient refuses services Readmission within the last 30 days:  No Current discharge risk:  None Barriers to Discharge:  Barriers Resolved   Derenda Fennel, MSW, LCSWA [phone] 05/25/2015 2:35 PM

## 2015-05-25 NOTE — [place] FL2 (Signed)
[place] MEDICAID FL2 LEVEL OF CARE SCREENING TOOL     IDENTIFICATION  Patient Name: Yvonne [name] Birthdate: May 24, 1918 Sex: female Admission Date (Current Location): 05/20/2015  [facility] and [place] Number:  Producer, television/film/video and Address:  [facility] [place], [address], [place], [place] [address]      Provider Number: [id number]  Attending Physician Name and Address:  Zannie Cove, MD  Relative Name and Phone Number:       Current Level of Care: Hospital Recommended Level of Care: Assisted Living Facility Prior Approval Number:    Date Approved/Denied:   PASRR Number:    Discharge Plan:  (ALF)    Current Diagnoses: Patient Active Problem List   Diagnosis Date Noted  . Left knee pain   . Pneumonia   . CHF (congestive heart failure) (HCC) 05/20/2015  . Acute congestive heart failure (HCC)   . Acute respiratory failure with hypoxia (HCC) 01/24/2015  . Acute on chronic diastolic CHF (congestive heart failure), NYHA class 2 (HCC) 01/24/2015  . Hypertensive urgency 01/24/2015  . HTN (hypertension) 08/08/2014  . CKD (chronic kidney disease) stage 3, GFR 30-59 ml/min 08/08/2014  . History of CVA (cerebrovascular accident) 08/05/2014  . Frequent falls 08/05/2014  . Frequency of urination 08/05/2014  . Cognitive impairment 08/05/2014  . COPD (chronic obstructive pulmonary disease) (HCC) 08/05/2014  . Distal radius fracture, left 06/12/2014  . Wrist fracture, closed 06/12/2014  . Other specified cardiac dysrhythmias(427.89) 04/08/2013  . Hypomagnesemia 04/07/2013  . Abnormal urinalysis 04/05/2013  . Dehydration 04/05/2013  . Orthostatic hypotension 04/05/2013  . Hypokalemia 04/05/2013  . Acute CVA (cerebrovascular accident)-nonhemorrhagic 04/04/2013    Orientation RESPIRATION BLADDER Height & Weight     Place, Self, Time, Situation  Normal   Weight: 149 lb 7.6 oz (67.8 kg) (scale a) Height:   (165.1 cm)  BEHAVIORAL  SYMPTOMS/MOOD NEUROLOGICAL BOWEL NUTRITION STATUS   (NONE )  (NONE ) Continent Diet (Regular )  AMBULATORY STATUS COMMUNICATION OF NEEDS Skin   Limited Assist Verbally Normal                       Personal Care Assistance Level of Assistance  Bathing, Dressing, Feeding Bathing Assistance: Limited assistance Feeding assistance: Independent Dressing Assistance: Limited assistance     Functional Limitations Info  Sight, Hearing, Speech Sight Info: Adequate Hearing Info: Impaired Speech Info: Adequate    SPECIAL CARE FACTORS FREQUENCY  PT (By licensed PT), OT (By licensed OT)     PT Frequency: 2 OT Frequency: 2            Contractures      Additional Factors Info  Code Status, Allergies Code Status Info: DNR CODE  Allergies Info: N/A            Discharge Medications: START taking these medications   Details  levofloxacin (LEVAQUIN) 750 MG tablet Take 1 tablet (750 mg total) by mouth every other day. For 1 day Qty: 1 tablet, Refills: 0      CONTINUE these medications which have CHANGED   Details  furosemide (LASIX) 20 MG tablet Take  in am and  in pm Qty: 30 tablet      CONTINUE these medications which have NOT CHANGED   Details  acetaminophen (TYLENOL) 325 MG tablet Take 2 tablets (650 mg total) by mouth every 6 (six) hours as needed for fever, headache, mild pain or moderate pain.    albuterol (PROVENTIL HFA;VENTOLIN HFA)  108 (90 BASE) MCG/ACT inhaler Inhale 2 puffs into the lungs every 6 (six) hours as needed for wheezing or shortness of breath. Qty: 1 Inhaler, Refills: 1    atorvastatin (LIPITOR) 20 MG tablet Take 1 tablet (20 mg total) by mouth daily at 6 PM. Qty: 30 tablet, Refills: 0    calcium-vitamin D (OSCAL WITH D) 500-200 MG-UNIT tablet Take 1 tablet by mouth daily with breakfast.    clopidogrel (PLAVIX) 75 MG tablet Take 1 tablet (75 mg total) by mouth daily with breakfast. Qty: 30 tablet, Refills:  1    docusate sodium (COLACE) 100 MG capsule Take 1 capsule (100 mg total) by mouth 2 (two) times daily as needed for mild constipation. Qty: 10 capsule, Refills: 0    Fluticasone-Salmeterol (ADVAIR DISKUS) 250-50 MCG/DOSE AEPB Inhale 1 puff into the lungs 2 (two) times daily. Qty: 60 each, Refills: 0    ipratropium-albuterol (DUONEB) 0.5-2.5 (3) MG/3ML SOLN Take 3 mLs by nebulization every 4 (four) hours as needed. Qty: 360 mL, Refills: 1    levothyroxine (SYNTHROID, LEVOTHROID) 25 MCG tablet Take 25 mcg by mouth daily at 12 noon.    LORazepam (ATIVAN) 0.5 MG tablet Take 0.5 mg by mouth every 6 (six) hours as needed for anxiety.    magnesium hydroxide (MILK OF MAGNESIA) 400 MG/5ML suspension Take 30 mLs by mouth daily as needed for mild constipation.    metoprolol tartrate (LOPRESSOR) 25 MG tablet Take 37.5 mg by mouth 2 (two) times daily.    Multiple Vitamins-Minerals (MULTIVITAMINS THER. W/MINERALS) TABS tablet Take 1 tablet by mouth daily.    ondansetron (ZOFRAN) 4 MG tablet Take 4 mg by mouth every 6 (six) hours as needed for nausea or vomiting.    polyethylene glycol (MIRALAX / GLYCOLAX) packet Take 17 g by mouth daily as needed. Qty: 14 each, Refills: 0    potassium chloride SA (K-DUR,KLOR-CON) 20 MEQ tablet Take 20 mEq by mouth daily at 12 noon.    sertraline (ZOLOFT) 50 MG tablet Take 50 mg by mouth daily.       STOP taking these medications     hydrALAZINE (APRESOLINE) 10 MG tablet         Relevant Imaging Results:  Relevant Lab Results:   Additional Information SSN [id number]  Derenda Fennel, MSW, LCSWA [phone] 05/25/2015 2:41 PM

## 2015-05-25 NOTE — Progress Notes (Signed)
[facility] Progress Note  Patient: Yvonne [name] WUJ:[id number]   PCP: Gaye Alken, MD DOB: 06-Dec-1918   DOA: 05/20/2015   DOS: 05/25/2015   Date of Service: the patient was seen and examined on 05/25/2015  Subjective:  -feels well, breathing better  Assessment and Plan: Acute respiratory failure with hypoxia to pneumonia  -Presented with oxygen saturation 84% on room air - possibly secondary to acute congestive heart failure versus right lower lobe pneumonia - required BiPAP initially - now off BiPAP and doing remarkably well.  -Repeat chest x-ray on 2/23 shows persistent right upper lobe infiltrate consistent with pneumonia. patient currently on Unasyn -SLP eval completed, regular diet recommended -Blood Cx negative, change to Oral levaquin   Paroxysmal atrial fib with RVR Acute on chronic diastolic dysfunction  -CHADSVASC is 7 given her fall risk and age she is not a candidate for anticoagulation, patient also wants to be minimalistic in her approach -Cardiology consulted, Echo  normal ejection fraction  BB rate increased, not a candidate for cardioversion -TSH 8.4, but T4 0.69 - currently on oral Lasix 40 mg and 20 mg twice a day  Left foot pain -Uric acid  elevated but no evidence of gout, Xrays negative  CKD stage III Creatinine baseline is 1.4 - crt is climbing - follow closely, could be elevated in the setting of hypertensive urgency  Hypertension - HTN Urgency  improved w/ nitroglycerin drip, but then developed hypotension therefore now off nitro - BP reasonably controlled - avoid overaggressive correction given advanced age , resume Lasix.  Elevated TSH - Free T4 is 0.69  COPD -stable  DVT Prophylaxis: subcutaneous Heparin  DNR/DNI  Communication: none at bedside Dispo: SNF tomorrow if stable  Brief Summary of Hospitalization:  Procedures: Echocardiogram, ejection fraction 60-65%  Consultants: cardiology Antibiotics: Anti-infectives     Start     Dose/Rate Route Frequency Ordered Stop   05/23/15 1600  ampicillin-sulbactam (UNASYN) 1.5 g in sodium chloride 0.9 % 50 mL IVPB     1.5 g 100 mL/hr over 30 Minutes Intravenous Every 12 hours 05/23/15 1112     05/22/15 0900  ampicillin-sulbactam (UNASYN) 1.5 g in sodium chloride 0.9 % 50 mL IVPB  Status:  Discontinued     1.5 g 100 mL/hr over 30 Minutes Intravenous Every 12 hours 05/22/15 0855 05/23/15 1112   05/20/15 1745  vancomycin (VANCOCIN) IVPB 1000 mg/200 mL premix     1,000 mg 200 mL/hr over 60 Minutes Intravenous  Once 05/20/15 1735 05/20/15 2015   05/20/15 1745  piperacillin-tazobactam (ZOSYN) IVPB 3.375 g     3.375 g 100 mL/hr over 30 Minutes Intravenous  Once 05/20/15 1735 05/20/15 1903        Intake/Output Summary (Last 24 hours) at 05/25/15 1114 Last data filed at 05/25/15 0900  Gross per 24 hour  Intake 1949.83 ml  Output   1201 ml  Net 748.83 ml   Filed Weights   05/23/15 0442 05/24/15 0414 05/25/15 0358  Weight: 68.085 kg (150 lb 1.6 oz) 68.2 kg (150 lb 5.7 oz) 67.8 kg (149 lb 7.6 oz)    Objective: Physical Exam: Filed Vitals:   05/24/15 2126 05/25/15 0358 05/25/15 0746 05/25/15 0900  BP:  127/73  113/35  Pulse:  100  105  Temp:  98.1 F (36.7 C)    TempSrc:  Oral    Resp:  18  18  Height:      Weight:  67.8 kg (149 lb 7.6 oz)    SpO2:  98% 93% 91%      General: Appear in mild distress, no Rash; Oral Mucosa moist. Cardiovascular: S1 and S2 Present, no Murmur, no JVD Respiratory: Bilateral Air entry present and basal Crackles, occasssional wheezes Abdomen: Bowel Sound present, Soft and no tenderness Extremities: trace Pedal edema, no calf tenderness Neurology: Grossly no focal neuro deficit.  Data Reviewed: CBC:  Recent Labs Lab 05/20/15 1600  05/21/15 0224 05/22/15 0445 05/23/15 0530 05/24/15 0350 05/25/15 0301  WBC 14.8*  --  10.4 7.7 7.0 6.2 6.0  NEUTROABS 13.1*  --   --   --   --   --   --   HGB 14.2  < > 12.0 11.9* 12.0  11.8* 11.6*  HCT 44.4  < > 37.7 38.7 37.5 37.9 35.9*  MCV 97.6  --  96.4 98.0 99.2 98.2 97.3  PLT 181  --  145* 142* 135* 152 143*  < > = values in this interval not displayed. Basic Metabolic Panel:  Recent Labs Lab 05/21/15 0224 05/22/15 0445 05/23/15 0530 05/24/15 0350 05/25/15 0301  NA 142 141 138 141 142  K 4.8 3.8 4.3 3.5 3.7  CL 99* 99* 98* 98* 100*  CO2 28 31 32 31 30  GLUCOSE 131* 143* 142* 136* 135*  BUN 38* 37* 28* 28* 29*  CREATININE 1.74* 1.55* 1.19* 1.25* 1.29*  CALCIUM 9.0 8.7* 8.6* 8.7* 8.6*  PHOS  --   --   --   --  4.0   Liver Function Tests:  Recent Labs Lab 05/20/15 1600 05/22/15 0445 05/23/15 0530 05/24/15 0350 05/25/15 0301  AST 63* --   ALT 41 --   ALKPHOS 44 35* 33* 37*  --   BILITOT 1.7* 1.4* 1.5* 1.3*  --   PROT 7.8 6.8 6.5 6.9  --   ALBUMIN 4.1 3.4* 3.0* 3.1* 2.8*   No results for input(s): LIPASE, AMYLASE in the last 168 hours. No results for input(s): AMMONIA in the last 168 hours.  Cardiac Enzymes:  Recent Labs Lab 05/22/15 0922  TROPONINI <0.03    BNP (last 3 results)  Recent Labs  01/24/15 0503 01/24/15 1122 05/20/15 1600  BNP 1013.9* 862.9* 529.3*    CBG: No results for input(s): GLUCAP in the last 168 hours.  Recent Results (from the past 240 hour(s))  Blood culture (routine x 2)     Status: None (Preliminary result)   Collection Time: 05/20/15  6:05 PM  Result Value Ref Range Status   Specimen Description BLOOD RIGHT HAND  Final   Special Requests BOTTLES DRAWN AEROBIC AND ANAEROBIC 5CC  Final   Culture NO GROWTH 4 DAYS  Final   Report Status PENDING  Incomplete  Blood culture (routine x 2)     Status: None (Preliminary result)   Collection Time: 05/20/15  6:10 PM  Result Value Ref Range Status   Specimen Description BLOOD LEFT HAND  Final   Special Requests BOTTLES DRAWN AEROBIC AND ANAEROBIC 5CC  Final   Culture NO GROWTH 4 DAYS  Final   Report Status PENDING  Incomplete  MRSA PCR  Screening     Status: None   Collection Time: 05/21/15  2:09 AM  Result Value Ref Range Status   MRSA by PCR NEGATIVE NEGATIVE Final    Comment:        The GeneXpert MRSA Assay (FDA approved for NASAL specimens only), is one component of a comprehensive MRSA colonization surveillance program. It is  not intended to diagnose MRSA infection nor to guide or monitor treatment for MRSA infections.      Studies: Dg Knee Complete 4 Views Left  05/24/2015  CLINICAL DATA:  Left knee pain, no known injury, initial encounter EXAM: LEFT KNEE - COMPLETE 4+ VIEW COMPARISON:  06/20/2008 FINDINGS: No acute fracture or dislocation is seen. No joint effusion is noted. Diffuse vascular calcifications are seen. IMPRESSION: No acute abnormality noted. Electronically Signed   By: Alcide Clever M.D.   On: 05/24/2015 14:15     Scheduled Meds: . ampicillin-sulbactam (UNASYN) IV  1.5 g Intravenous Q12H  . antiseptic oral rinse  7 mL Mouth Rinse BID  . atorvastatin  20 mg Oral q1800  . clopidogrel  75 mg Oral Q breakfast  . colchicine  0.6 mg Oral Daily  . furosemide  20 mg Oral Q1500  . furosemide  40 mg Oral Daily  . heparin  5,000 Units Subcutaneous 3 times per day  . levothyroxine  25 mcg Oral QAC breakfast  . metoprolol tartrate  37.5 mg Oral BID  . mometasone-formoterol  2 puff Inhalation BID  . senna  1 tablet Oral Once  . sertraline  50 mg Oral Daily  . sodium chloride flush  3 mL Intravenous Q12H   Continuous Infusions: . sodium chloride 10 mL/hr at 05/21/15 0101   PRN Meds: acetaminophen **OR** acetaminophen, albuterol, docusate sodium, HYDROcodone-acetaminophen, morphine injection, ondansetron **OR** ondansetron (ZOFRAN) IV  Time spent: 30 minutes  Author: Zannie Cove, MD [facility] Pager: [phone]  05/25/2015 11:14 AM  If 7PM-7AM, please contact night-coverage at [email or web address], password [email or web address]

## 2015-05-26 DIAGNOSIS — I4891 Unspecified atrial fibrillation: Secondary | ICD-10-CM | POA: Insufficient documentation

## 2015-05-26 MED ORDER — FUROSEMIDE 20 MG PO TABS: 40.0000 mg | ORAL_TABLET | Freq: Every day | ORAL | Status: DC

## 2015-05-26 MED ORDER — LEVOFLOXACIN 750 MG PO TABS: 750.0000 mg | ORAL_TABLET | ORAL | Status: DC

## 2015-05-26 NOTE — Care Management Important Message (Signed)
Important Message  Patient Details  Name: Yvonne [name] MRN: [id number] Date of Birth: 1918/09/18   Medicare Important Message Given:  Yes    Oralia Rud Wassim Kirksey 05/26/2015, 4:37 PM

## 2015-05-26 NOTE — Progress Notes (Signed)
Physical Therapy Treatment Patient Details Name: Yvonne [name] MRN: [id number] DOB: [birth year] Today's Date: 05/26/2015    History of Present Illness 82 y.o. F ALF resident with history of COPD, CVA, chronic diastolic CHF (TTE June 2016 EF of 55-60% - grade 2 diastolic dysfunction) who developed shortness of breath and hemoptysis. CXR with RLL infiltrate v/s asymmetric edema     PT Comments    Pt ambulated with min/guard with RW with decreased ataxia compared to last session. No LOB with gait.  Family would like pt to return to ALF. Pt agitated about hospital room set-up.  She may be more relaxed in her own environment.   Recommend HHPT.  Follow Up Recommendations  Home health PT;Supervision for mobility/OOB;Supervision - Intermittent;Other (comment) (family declining SNF)     Equipment Recommendations  None recommended by PT    Recommendations for Other Services       Precautions / Restrictions Precautions Precautions: Fall Restrictions Weight Bearing Restrictions: No    Mobility  Bed Mobility Overal bed mobility: Modified Independent                Transfers Overall transfer level: Needs assistance Equipment used: Rolling walker (2 wheeled) Transfers: Sit to/from Stand Sit to Stand: Supervision         General transfer comment: pt initially attempted to stand while pulling on RW, was about to cue pt, but she then put hands on bed and pushed from there.  Ambulation/Gait Ambulation/Gait assistance: Min guard Ambulation Distance (Feet): 100 Feet Assistive device: Rolling walker (2 wheeled) Gait Pattern/deviations: Step-through pattern;Decreased stride length     General Gait Details: Pt without ataxia noted during last PT session.  no LOB with RW and ambulated with min/guard.   Stairs            Wheelchair Mobility    Modified Rankin (Stroke Patients Only)       Balance Overall balance assessment: Needs assistance   Sitting  balance-Leahy Scale: Good       Standing balance-Leahy Scale: Fair Standing balance comment: slight posterior sway initially                    Cognition Arousal/Alertness: Awake/alert Behavior During Therapy: WFL for tasks assessed/performed Overall Cognitive Status: History of cognitive impairments - at baseline       Memory: Decreased short-term memory   Safety/Judgement: Decreased awareness of deficits;Decreased awareness of safety     General Comments: Pt thinking her room is being used as a storage room.  Explained that all rooms have computers now.  Placed all linens in drawers and moved trash can next to her.  Pt calmed down.  Pt thought she was 89    Exercises General Exercises - Lower Extremity Ankle Circles/Pumps: Both;20 reps;Seated Long Arc Quad: Strengthening;Both;10 reps;Seated Hip ABduction/ADduction: Strengthening;Both;10 reps;Seated Hip Flexion/Marching: Strengthening;Both;10 reps;Seated    General Comments        Pertinent Vitals/Pain Pain Assessment: Faces Faces Pain Scale: Hurts little more Pain Location: R biceps with gait Pain Descriptors / Indicators: Discomfort;Sore Pain Intervention(s): Monitored during session    Home Living                      Prior Function            PT Goals (current goals can now be found in the care plan section) Acute Rehab PT Goals Patient Stated Goal: to return to ALF PT Goal Formulation: With patient Time For Goal  Achievement: 06/05/15 Potential to Achieve Goals: Good Progress towards PT goals: Progressing toward goals    Frequency  Min 3X/week    PT Plan Discharge plan needs to be updated;Frequency needs to be updated    Co-evaluation             End of Session Equipment Utilized During Treatment: Gait belt Activity Tolerance: Patient tolerated treatment well;Other (comment) (pt much calmer and happier at end of session) Patient left: in chair;with call bell/phone within  reach;with chair alarm set     Time: [id number] PT Time Calculation (min) (ACUTE ONLY): 26 min  Charges:  $Gait Training: 8-22 mins $Therapeutic Exercise: 8-22 mins                    G Codes:      Yvonne [name] 05/26/2015, 11:57 AM

## 2015-05-26 NOTE — Progress Notes (Signed)
CSW faxed d/c paperwork to [facility], per her request.  Pollyann Savoy, LCSW Evening/ED Coverage [phone]

## 2015-05-26 NOTE — Progress Notes (Signed)
    SUBJECTIVE:  Short term memory problem.  She denies falls to me.  No palpitations   PHYSICAL EXAM Filed Vitals:   05/25/15 1936 05/25/15 2150 05/26/15 0334 05/26/15 1000  BP: 122/79  131/76 148/82  Pulse: 108  85   Temp: 98 F (36.7 C)  97.4 F (36.3 C)   TempSrc: Oral  Oral   Resp: 18  18   Height:      Weight:   147 lb 4.3 oz (66.8 kg)   SpO2: 98% 98% 98% 95%   General:  No distress Lungs:  Clear Heart:  Irregular Abdomen:  Positive bowel sounds, no rebound no guarding Extremities:  No edema  LABS:  No results found for this or any previous visit (from the past 24 hour(s)).  Intake/Output Summary (Last 24 hours) at 05/26/15 1118 Last data filed at 05/26/15 4098  Gross per 24 hour  Intake    940 ml  Output   1202 ml  Net   -262 ml     ASSESSMENT AND PLAN:  ATRIAL FIB:  Rate is controlled.  I think we are going to need to watch her at her nursing facility.  If she does not have falls going forward then we should start anticoagulation as an out patient.  Dr. Elberta Fortis thought that she was high risk for fall after talking to PT.  PT today suggested she was fairly good.    HTN:   BP OK.     Fayrene Fearing [facility] 05/26/2015 11:18 AM

## 2015-05-26 NOTE — Discharge Summary (Addendum)
Physician Discharge Summary  Sharyn Dross ZOX:[id number] DOB: Mar 02, 1919 DOA: 05/20/2015  PCP: Gaye Alken, MD  Admit date: 05/20/2015 Discharge date: 05/26/2015  Time spent: 45 minutes  Recommendations for Outpatient Follow-up:  1. Dr.Barnes in 1 week, please check Bmet at FU please repeat TSH and free T4 in 1-68months  Discharge Diagnoses:  Principal Problem:   Acute respiratory failure with hypoxia (HCC) Active Problems:   HTN (hypertension)   CKD (chronic kidney disease) stage 3, GFR 30-59 ml/min   Acute on chronic diastolic CHF (congestive heart failure), NYHA class 2 (HCC)   Left knee pain   Pneumonia   Discharge Condition: stable  Diet recommendation: low sodium, heart healthy  Filed Weights   05/24/15 0414 05/25/15 0358 05/26/15 0334  Weight: 68.2 kg (150 lb 5.7 oz) 67.8 kg (149 lb 7.6 oz) 66.8 kg (147 lb 4.3 oz)    History of present illness:  Chief Complaint: SOB HPI: Yvonne [name] is a 82 y.o. female with past medial history of chronic diastolic CHF , last 2-D echo in June 2016 showed LVEF of 55-60% with grade 2 diastolic dysfunction. Patient lives in ALF, she developed shortness of breath and hemoptysis on 2/21 so she brought to the ED for further evaluation. In the ED chest x-ray showed congestive heart failure, patient has had high blood pressure of 178/114, started on nitroglycerin drip in the ED and blood pressure improved. She was placed on BiPAP on admission  Hospital Course:  Acute respiratory failure with hypoxia to pneumonia  -Presented with oxygen saturation 84% on room air - secondary to acute congestive heart failure +/- right lower lobe pneumonia - required BiPAP initially - since then off BiPAP and improving well.  -Repeat chest x-ray on 2/23 shows persistent right upper lobe infiltrate consistent with pneumonia.  -she was treated with IV Unasyn and IV lasix  -SLP eval completed, regular diet recommended -Blood Cx negative,  change to Oral levaquin, completed 6days of abx, needs 1 more day  Paroxysmal atrial fib with RVR -CHADSVASC is 7, but given her fall risk and age she is not considered a candidate for anticoagulation, patient also wants to be minimalistic in her approach -Cardiology consulted, Echo normal ejection fraction BB rate increased, not a candidate for cardioversion -TSH 8.4, but T4 within normal limits, repeat in 2months  Acute on chronic diastolic dysfunction  -initially diuresed with IV lasix, improved,  -followed by Cardiology currently on oral Lasix 40 mg in am and 20 mg in pm  Left foot pain -Uric acid elevated but no evidence of gout, Xrays negative  CKD stage III Creatinine baseline is 1.4  -stable  Hypertension - HTN Urgency  improved w/ nitroglycerin drip, but then developed hypotension therefore now off nitro - BP reasonably controlled - avoided overaggressive correction given advanced age , resumed Lasix.  Elevated TSH - Free T4 is 0.69  COPD -stable  Consultations:  Cardiology  Discharge Exam: Filed Vitals:   05/26/15 0334 05/26/15 1000  BP: 131/76 148/82  Pulse: 85   Temp: 97.4 F (36.3 C)   Resp: 18     General: AAOx3 Cardiovascular: S1S2/RRR Respiratory: CTAB  Discharge Instructions   Discharge Instructions    Diet - low sodium heart healthy    Complete by:  As directed      Diet - low sodium heart healthy    Complete by:  As directed      Increase activity slowly    Complete by:  As directed  Increase activity slowly    Complete by:  As directed           Current Discharge Medication List    START taking these medications   Details  levofloxacin (LEVAQUIN) 750 MG tablet Take 1 tablet (750 mg total) by mouth every other day. For 1 day Qty: 1 tablet, Refills: 0      CONTINUE these medications which have CHANGED   Details  furosemide (LASIX) 20 MG tablet  Take 40mg  in am and 20mg  in pm Qty: 30 tablet      CONTINUE these  medications which have NOT CHANGED   Details  acetaminophen (TYLENOL) 325 MG tablet Take 2 tablets (650 mg total) by mouth every 6 (six) hours as needed for fever, headache, mild pain or moderate pain.    albuterol (PROVENTIL HFA;VENTOLIN HFA) 108 (90 BASE) MCG/ACT inhaler Inhale 2 puffs into the lungs every 6 (six) hours as needed for wheezing or shortness of breath. Qty: 1 Inhaler, Refills: 1    atorvastatin (LIPITOR) 20 MG tablet Take 1 tablet (20 mg total) by mouth daily at 6 PM. Qty: 30 tablet, Refills: 0    calcium-vitamin D (OSCAL WITH D) 500-200 MG-UNIT tablet Take 1 tablet by mouth daily with breakfast.    clopidogrel (PLAVIX) 75 MG tablet Take 1 tablet (75 mg total) by mouth daily with breakfast. Qty: 30 tablet, Refills: 1    docusate sodium (COLACE) 100 MG capsule Take 1 capsule (100 mg total) by mouth 2 (two) times daily as needed for mild constipation. Qty: 10 capsule, Refills: 0    Fluticasone-Salmeterol (ADVAIR DISKUS) 250-50 MCG/DOSE AEPB Inhale 1 puff into the lungs 2 (two) times daily. Qty: 60 each, Refills: 0    ipratropium-albuterol (DUONEB) 0.5-2.5 (3) MG/3ML SOLN Take 3 mLs by nebulization every 4 (four) hours as needed. Qty: 360 mL, Refills: 1    levothyroxine (SYNTHROID, LEVOTHROID) 25 MCG tablet Take 25 mcg by mouth daily at 12 noon.    LORazepam (ATIVAN) 0.5 MG tablet Take 0.5 mg by mouth every 6 (six) hours as needed for anxiety.    magnesium hydroxide (MILK OF MAGNESIA) 400 MG/5ML suspension Take 30 mLs by mouth daily as needed for mild constipation.    metoprolol tartrate (LOPRESSOR) 25 MG tablet Take 37.5 mg by mouth 2 (two) times daily.    Multiple Vitamins-Minerals (MULTIVITAMINS THER. W/MINERALS) TABS tablet Take 1 tablet by mouth daily.    ondansetron (ZOFRAN) 4 MG tablet Take 4 mg by mouth every 6 (six) hours as needed for nausea or vomiting.    polyethylene glycol (MIRALAX / GLYCOLAX) packet Take 17 g by mouth daily as needed. Qty: 14 each,  Refills: 0    potassium chloride SA (K-DUR,KLOR-CON) 20 MEQ tablet Take 20 mEq by mouth daily at 12 noon.    sertraline (ZOLOFT) 50 MG tablet Take 50 mg by mouth daily.       STOP taking these medications     hydrALAZINE (APRESOLINE) 10 MG tablet        No Known Allergies    The results of significant diagnostics from this hospitalization (including imaging, microbiology, ancillary and laboratory) are listed below for reference.    Significant Diagnostic Studies: Dg Chest Port 1 View  05/22/2015  CLINICAL DATA:  Pneumonia. EXAM: PORTABLE CHEST 1 VIEW COMPARISON:  05/20/2015 . FINDINGS: Mediastinum hilar structures are stable. Cardiomegaly with pulmonary vascular prominence and interstitial prominence with bilateral effusions. Findings consistent mild congestive heart failure. Slight interim clearing. Ill-defined infiltrate in  the right upper lobe is again noted. This is most consistent pneumonia. Follow-up chest x-rays to demonstrate clearing suggested in order to exclude right upper lobe mass. No pneumothorax. Surgical clip over the right neck. Old right rib fractures. IMPRESSION: 1. Cardiomegaly with pulmonary vascular prominence and mild interstitial prominence with small pleural effusions. Findings consistent with mild congestive heart failure. Slight interim clearing. 2. Persistent right upper lobe infiltrate most consistent with pneumonia. Close follow-up chest x-ray suggested to demonstrate clearing in order to exclude an right upper lobe mass . Electronically Signed   By: Maisie Fus  Register   On: 05/22/2015 07:10   Dg Chest Portable 1 View  05/20/2015  CLINICAL DATA:  Shortness of breath EXAM: PORTABLE CHEST 1 VIEW COMPARISON:  01/24/2015 FINDINGS: Moderate cardiac enlargement. Diffuse interstitial change with more coarse markings in the right lower lobe. Diffusely improved aeration except for right lower lobe. Improved vascular congestion. Small bilateral pleural effusions. IMPRESSION:  Improving pulmonary edema with persistent asymmetric opacity right lung base. This could represent asymmetric edema. Pneumonia or pneumonitis in the right lower lobe not excluded. Electronically Signed   By: Esperanza Heir M.D.   On: 05/20/2015 16:58   Dg Knee Complete 4 Views Left  05/24/2015  CLINICAL DATA:  Left knee pain, no known injury, initial encounter EXAM: LEFT KNEE - COMPLETE 4+ VIEW COMPARISON:  06/20/2008 FINDINGS: No acute fracture or dislocation is seen. No joint effusion is noted. Diffuse vascular calcifications are seen. IMPRESSION: No acute abnormality noted. Electronically Signed   By: Alcide Clever M.D.   On: 05/24/2015 14:15   Dg Foot Complete Left  05/23/2015  CLINICAL DATA:  Left foot pain across top of left foot.  No injury. EXAM: LEFT FOOT - COMPLETE 3+ VIEW COMPARISON:  None. FINDINGS: Mild degenerative changes in the left first tarsometatarsal joint and metatarsal phalangeal joint. Plantar calcaneal spur. No acute bony abnormality. Specifically, no fracture, subluxation, or dislocation. Soft tissues are intact. IMPRESSION: No acute bony abnormality. Electronically Signed   By: Charlett Nose M.D.   On: 05/23/2015 13:42    Microbiology: Recent Results (from the past 240 hour(s))  Blood culture (routine x 2)     Status: None   Collection Time: 05/20/15  6:05 PM  Result Value Ref Range Status   Specimen Description BLOOD RIGHT HAND  Final   Special Requests BOTTLES DRAWN AEROBIC AND ANAEROBIC 5CC  Final   Culture NO GROWTH 5 DAYS  Final   Report Status 05/25/2015 FINAL  Final  Blood culture (routine x 2)     Status: None   Collection Time: 05/20/15  6:10 PM  Result Value Ref Range Status   Specimen Description BLOOD LEFT HAND  Final   Special Requests BOTTLES DRAWN AEROBIC AND ANAEROBIC 5CC  Final   Culture NO GROWTH 5 DAYS  Final   Report Status 05/25/2015 FINAL  Final  MRSA PCR Screening     Status: None   Collection Time: 05/21/15  2:09 AM  Result Value Ref Range  Status   MRSA by PCR NEGATIVE NEGATIVE Final    Comment:        The GeneXpert MRSA Assay (FDA approved for NASAL specimens only), is one component of a comprehensive MRSA colonization surveillance program. It is not intended to diagnose MRSA infection nor to guide or monitor treatment for MRSA infections.      Labs: Basic Metabolic Panel:  Recent Labs Lab 05/21/15 0224 05/22/15 0445 05/23/15 0530 05/24/15 0350 05/25/15 0301  NA 142 141  138 141 142  K 4.8 3.8 4.3 3.5 3.7  CL 99* 99* 98* 98* 100*  CO2 28 31 32 31 30  GLUCOSE 131* 143* 142* 136* 135*  BUN 38* 37* 28* 28* 29*  CREATININE 1.74* 1.55* 1.19* 1.25* 1.29*  CALCIUM 9.0 8.7* 8.6* 8.7* 8.6*  PHOS  --   --   --   --  4.0   Liver Function Tests:  Recent Labs Lab 05/20/15 1600 05/22/15 0445 05/23/15 0530 05/24/15 0350 05/25/15 0301  AST 63* 24 18 16   --   ALT 41 29 23 21   --   ALKPHOS 44 35* 33* 37*  --   BILITOT 1.7* 1.4* 1.5* 1.3*  --   PROT 7.8 6.8 6.5 6.9  --   ALBUMIN 4.1 3.4* 3.0* 3.1* 2.8*   No results for input(s): LIPASE, AMYLASE in the last 168 hours. No results for input(s): AMMONIA in the last 168 hours. CBC:  Recent Labs Lab 05/20/15 1600  05/21/15 0224 05/22/15 0445 05/23/15 0530 05/24/15 0350 05/25/15 0301  WBC 14.8*  --  10.4 7.7 7.0 6.2 6.0  NEUTROABS 13.1*  --   --   --   --   --   --   HGB 14.2  < > 12.0 11.9* 12.0 11.8* 11.6*  HCT 44.4  < > 37.7 38.7 37.5 37.9 35.9*  MCV 97.6  --  96.4 98.0 99.2 98.2 97.3  PLT 181  --  145* 142* 135* 152 143*  < > = values in this interval not displayed. Cardiac Enzymes:  Recent Labs Lab 05/22/15 0922  TROPONINI <0.03   BNP: BNP (last 3 results)  Recent Labs  01/24/15 0503 01/24/15 1122 05/20/15 1600  BNP 1013.9* 862.9* 529.3*    ProBNP (last 3 results) No results for input(s): PROBNP in the last 8760 hours.  CBG: No results for input(s): GLUCAP in the last 168 hours.     SignedZannie Cove MD.  [facility] 05/26/2015, 11:23 AM

## 2015-05-26 NOTE — Progress Notes (Signed)
1600 discharge given to pt and [name] .Both verbalized understanding.m Wheeled pt to lobby

## 2015-05-26 NOTE — Clinical Social Work Note (Signed)
CSW spoke with [facility] and provided dc summary/FL2 for their review.  Patient's HCPOA/Friend, Cardell Peach, will transport patient back to [facility] at time of discharge.  RN updated.  Vickii Penna, LCSW [phone]  [id number]; 2S 15-16 and [facility] Line Licensed Clinical Social Worker

## 2015-10-25 ENCOUNTER — Emergency Department (HOSPITAL_COMMUNITY): Payer: Medicare Other

## 2015-10-25 ENCOUNTER — Encounter (HOSPITAL_COMMUNITY): Payer: Self-pay | Admitting: *Deleted

## 2015-10-25 ENCOUNTER — Observation Stay (HOSPITAL_COMMUNITY)
Admission: EM | Admit: 2015-10-25 | Discharge: 2015-10-27 | Disposition: A | Discharge status: Rehabilitation Facility | Payer: Medicare Other | Attending: Internal Medicine | Admitting: Internal Medicine

## 2015-10-25 DIAGNOSIS — F329 Major depressive disorder, single episode, unspecified: Secondary | ICD-10-CM | POA: Insufficient documentation

## 2015-10-25 DIAGNOSIS — F0280 Dementia in other diseases classified elsewhere without behavioral disturbance: Secondary | ICD-10-CM

## 2015-10-25 DIAGNOSIS — N289 Disorder of kidney and ureter, unspecified: Secondary | ICD-10-CM

## 2015-10-25 DIAGNOSIS — Z7902 Long term (current) use of antithrombotics/antiplatelets: Secondary | ICD-10-CM | POA: Diagnosis not present

## 2015-10-25 DIAGNOSIS — R0902 Hypoxemia: Secondary | ICD-10-CM | POA: Diagnosis present

## 2015-10-25 DIAGNOSIS — I509 Heart failure, unspecified: Secondary | ICD-10-CM

## 2015-10-25 DIAGNOSIS — J9601 Acute respiratory failure with hypoxia: Secondary | ICD-10-CM

## 2015-10-25 DIAGNOSIS — G3183 Dementia with Lewy bodies: Secondary | ICD-10-CM

## 2015-10-25 DIAGNOSIS — W19XXXA Unspecified fall, initial encounter: Secondary | ICD-10-CM | POA: Diagnosis present

## 2015-10-25 DIAGNOSIS — Z66 Do not resuscitate: Secondary | ICD-10-CM | POA: Diagnosis not present

## 2015-10-25 DIAGNOSIS — N184 Chronic kidney disease, stage 4 (severe): Secondary | ICD-10-CM | POA: Insufficient documentation

## 2015-10-25 DIAGNOSIS — I5033 Acute on chronic diastolic (congestive) heart failure: Secondary | ICD-10-CM | POA: Diagnosis present

## 2015-10-25 DIAGNOSIS — Z79899 Other long term (current) drug therapy: Secondary | ICD-10-CM | POA: Diagnosis not present

## 2015-10-25 DIAGNOSIS — J9600 Acute respiratory failure, unspecified whether with hypoxia or hypercapnia: Secondary | ICD-10-CM | POA: Insufficient documentation

## 2015-10-25 DIAGNOSIS — F419 Anxiety disorder, unspecified: Secondary | ICD-10-CM | POA: Insufficient documentation

## 2015-10-25 DIAGNOSIS — E039 Hypothyroidism, unspecified: Secondary | ICD-10-CM | POA: Insufficient documentation

## 2015-10-25 DIAGNOSIS — E7800 Pure hypercholesterolemia, unspecified: Secondary | ICD-10-CM | POA: Diagnosis not present

## 2015-10-25 DIAGNOSIS — R0600 Dyspnea, unspecified: Secondary | ICD-10-CM

## 2015-10-25 DIAGNOSIS — Z7982 Long term (current) use of aspirin: Secondary | ICD-10-CM | POA: Diagnosis not present

## 2015-10-25 DIAGNOSIS — Z8673 Personal history of transient ischemic attack (TIA), and cerebral infarction without residual deficits: Secondary | ICD-10-CM | POA: Diagnosis not present

## 2015-10-25 DIAGNOSIS — N183 Chronic kidney disease, stage 3 unspecified: Secondary | ICD-10-CM | POA: Diagnosis present

## 2015-10-25 DIAGNOSIS — I480 Paroxysmal atrial fibrillation: Secondary | ICD-10-CM | POA: Insufficient documentation

## 2015-10-25 DIAGNOSIS — J440 Chronic obstructive pulmonary disease with acute lower respiratory infection: Secondary | ICD-10-CM | POA: Diagnosis not present

## 2015-10-25 DIAGNOSIS — I130 Hypertensive heart and chronic kidney disease with heart failure and stage 1 through stage 4 chronic kidney disease, or unspecified chronic kidney disease: Secondary | ICD-10-CM | POA: Diagnosis not present

## 2015-10-25 DIAGNOSIS — I10 Essential (primary) hypertension: Secondary | ICD-10-CM | POA: Diagnosis present

## 2015-10-25 DIAGNOSIS — R0602 Shortness of breath: Secondary | ICD-10-CM

## 2015-10-25 HISTORY — DX: Heart failure, unspecified: I50.9

## 2015-10-25 LAB — BRAIN NATRIURETIC PEPTIDE: B NATRIURETIC PEPTIDE 5: 834.9 pg/mL — AB (ref 0.0–100.0)

## 2015-10-25 LAB — URINALYSIS, ROUTINE W REFLEX MICROSCOPIC
BILIRUBIN URINE: NEGATIVE
Glucose, UA: NEGATIVE mg/dL
HGB URINE DIPSTICK: NEGATIVE
Ketones, ur: NEGATIVE mg/dL
NITRITE: NEGATIVE
PH: 5 (ref 5.0–8.0)
Protein, ur: NEGATIVE mg/dL
SPECIFIC GRAVITY, URINE: 1.013 (ref 1.005–1.030)

## 2015-10-25 LAB — URINE MICROSCOPIC-ADD ON
BACTERIA UA: NONE SEEN
RBC / HPF: NONE SEEN RBC/hpf (ref 0–5)

## 2015-10-25 LAB — BASIC METABOLIC PANEL
ANION GAP: 10 (ref 5–15)
BUN: 38 mg/dL — ABNORMAL HIGH (ref 6–20)
CALCIUM: 9.3 mg/dL (ref 8.9–10.3)
CHLORIDE: 100 mmol/L — AB (ref 101–111)
CO2: 27 mmol/L (ref 22–32)
CREATININE: 1.66 mg/dL — AB (ref 0.44–1.00)
GFR calc Af Amer: 29 mL/min — ABNORMAL LOW (ref >60)
GFR calc non Af Amer: 25 mL/min — ABNORMAL LOW (ref >60)
GLUCOSE: 109 mg/dL — AB (ref 65–99)
Potassium: 4.1 mmol/L (ref 3.5–5.1)
Sodium: 137 mmol/L (ref 135–145)

## 2015-10-25 LAB — CBC WITH DIFFERENTIAL/PLATELET
BASOS PCT: 0 %
Basophils Absolute: 0 10*3/uL (ref 0.0–0.1)
Eosinophils Absolute: 0.1 10*3/uL (ref 0.0–0.7)
Eosinophils Relative: 2 %
HEMATOCRIT: 43.6 % (ref 36.0–46.0)
Hemoglobin: 14 g/dL (ref 12.0–15.0)
LYMPHS ABS: 0.6 10*3/uL — AB (ref 0.7–4.0)
Lymphocytes Relative: 9 %
MCH: 32.1 pg (ref 26.0–34.0)
MCHC: 32.1 g/dL (ref 30.0–36.0)
MCV: 100 fL (ref 78.0–100.0)
MONO ABS: 0.6 10*3/uL (ref 0.1–1.0)
MONOS PCT: 9 %
NEUTROS ABS: 5.5 10*3/uL (ref 1.7–7.7)
Neutrophils Relative %: 80 %
Platelets: 148 10*3/uL — ABNORMAL LOW (ref 150–400)
RBC: 4.36 MIL/uL (ref 3.87–5.11)
RDW: 15.2 % (ref 11.5–15.5)
WBC: 6.9 10*3/uL (ref 4.0–10.5)

## 2015-10-25 LAB — I-STAT TROPONIN, ED: Troponin i, poc: 0 ng/mL (ref 0.00–0.08)

## 2015-10-25 LAB — D-DIMER, QUANTITATIVE (NOT AT ARMC): D DIMER QUANT: 1.59 ug{FEU}/mL — AB (ref 0.00–0.50)

## 2015-10-25 LAB — MAGNESIUM: MAGNESIUM: 2.1 mg/dL (ref 1.7–2.4)

## 2015-10-25 LAB — TSH: TSH: 3.207 u[IU]/mL (ref 0.350–4.500)

## 2015-10-25 LAB — T4, FREE: FREE T4: 1.08 ng/dL (ref 0.61–1.12)

## 2015-10-25 LAB — TROPONIN I

## 2015-10-25 MED ORDER — ONDANSETRON HCL 4 MG/2ML IJ SOLN: 4.0000 mg | Freq: Four times a day (QID) | INTRAMUSCULAR | Status: DC | PRN

## 2015-10-25 MED ORDER — POLYETHYLENE GLYCOL 3350 17 G PO PACK: 17.0000 g | PACK | Freq: Every day | ORAL | Status: DC | PRN

## 2015-10-25 MED ORDER — ASPIRIN EC 81 MG PO TBEC
81.0000 mg | DELAYED_RELEASE_TABLET | Freq: Every day | ORAL | Status: DC
  Administered 2015-10-25 – 2015-10-27 (×3): 81 mg via ORAL
  Filled 2015-10-25 (×3): qty 1

## 2015-10-25 MED ORDER — CLOPIDOGREL BISULFATE 75 MG PO TABS: 75.0000 mg | ORAL_TABLET | Freq: Every day | ORAL | Status: DC

## 2015-10-25 MED ORDER — DOCUSATE SODIUM 100 MG PO CAPS
100.0000 mg | ORAL_CAPSULE | Freq: Two times a day (BID) | ORAL | Status: DC | PRN
Start: 2015-10-25 — End: 2015-10-27
  Administered 2015-10-26: 100 mg via ORAL
  Filled 2015-10-25: qty 1

## 2015-10-25 MED ORDER — LORAZEPAM 0.5 MG PO TABS
0.5000 mg | ORAL_TABLET | Freq: Three times a day (TID) | ORAL | Status: DC | PRN
  Administered 2015-10-25: 0.5 mg via ORAL
  Filled 2015-10-25: qty 1

## 2015-10-25 MED ORDER — LEVOTHYROXINE SODIUM 25 MCG PO TABS
25.0000 ug | ORAL_TABLET | Freq: Every day | ORAL | Status: DC
  Administered 2015-10-26 – 2015-10-27 (×2): 25 ug via ORAL
  Filled 2015-10-25 (×2): qty 1

## 2015-10-25 MED ORDER — SODIUM CHLORIDE 0.9% FLUSH: 3.0000 mL | INTRAVENOUS | Status: DC | PRN

## 2015-10-25 MED ORDER — CALCIUM CARBONATE-VITAMIN D 500-200 MG-UNIT PO TABS: 1.0000 | ORAL_TABLET | Freq: Every day | ORAL | Status: DC

## 2015-10-25 MED ORDER — FUROSEMIDE 10 MG/ML IJ SOLN
40.0000 mg | Freq: Once | INTRAMUSCULAR | Status: AC
  Administered 2015-10-25: 40 mg via INTRAVENOUS
  Filled 2015-10-25: qty 4

## 2015-10-25 MED ORDER — ACETAMINOPHEN 325 MG PO TABS
650.0000 mg | ORAL_TABLET | Freq: Four times a day (QID) | ORAL | Status: DC | PRN
  Administered 2015-10-25 – 2015-10-26 (×2): 650 mg via ORAL
  Filled 2015-10-25 (×2): qty 2

## 2015-10-25 MED ORDER — HEPARIN SODIUM (PORCINE) 5000 UNIT/ML IJ SOLN
5000.0000 [IU] | Freq: Three times a day (TID) | INTRAMUSCULAR | Status: DC
  Administered 2015-10-25 – 2015-10-27 (×5): 5000 [IU] via SUBCUTANEOUS
  Filled 2015-10-25 (×5): qty 1

## 2015-10-25 MED ORDER — FUROSEMIDE 10 MG/ML IJ SOLN
40.0000 mg | Freq: Two times a day (BID) | INTRAMUSCULAR | Status: AC
  Administered 2015-10-25 – 2015-10-26 (×2): 40 mg via INTRAVENOUS
  Filled 2015-10-25 (×2): qty 4

## 2015-10-25 MED ORDER — MOMETASONE FURO-FORMOTEROL FUM 200-5 MCG/ACT IN AERO
2.0000 | INHALATION_SPRAY | Freq: Two times a day (BID) | RESPIRATORY_TRACT | Status: DC
  Administered 2015-10-25 – 2015-10-27 (×4): 2 via RESPIRATORY_TRACT
  Filled 2015-10-25: qty 8.8

## 2015-10-25 MED ORDER — ADULT MULTIVITAMIN W/MINERALS CH
1.0000 | ORAL_TABLET | Freq: Every day | ORAL | Status: DC
  Administered 2015-10-26 – 2015-10-27 (×2): 1 via ORAL
  Filled 2015-10-25 (×2): qty 1

## 2015-10-25 MED ORDER — SODIUM CHLORIDE 0.9 % IV SOLN: 250.0000 mL | INTRAVENOUS | Status: DC | PRN

## 2015-10-25 MED ORDER — SERTRALINE HCL 50 MG PO TABS
50.0000 mg | ORAL_TABLET | Freq: Every day | ORAL | Status: DC
  Administered 2015-10-26 – 2015-10-27 (×2): 50 mg via ORAL
  Filled 2015-10-25 (×2): qty 1

## 2015-10-25 MED ORDER — ACETAMINOPHEN 325 MG PO TABS
650.0000 mg | ORAL_TABLET | Freq: Once | ORAL | Status: AC
  Administered 2015-10-25: 650 mg via ORAL
  Filled 2015-10-25: qty 2

## 2015-10-25 MED ORDER — IPRATROPIUM-ALBUTEROL 0.5-2.5 (3) MG/3ML IN SOLN
3.0000 mL | Freq: Once | RESPIRATORY_TRACT | Status: AC
  Administered 2015-10-25: 3 mL via RESPIRATORY_TRACT
  Filled 2015-10-25: qty 3

## 2015-10-25 MED ORDER — METOPROLOL TARTRATE 25 MG PO TABS
37.5000 mg | ORAL_TABLET | Freq: Two times a day (BID) | ORAL | Status: DC
  Administered 2015-10-25 – 2015-10-27 (×4): 37.5 mg via ORAL
  Filled 2015-10-25 (×4): qty 1

## 2015-10-25 MED ORDER — IPRATROPIUM-ALBUTEROL 0.5-2.5 (3) MG/3ML IN SOLN: 3.0000 mL | Freq: Four times a day (QID) | RESPIRATORY_TRACT | Status: DC | PRN

## 2015-10-25 MED ORDER — POTASSIUM CHLORIDE CRYS ER 20 MEQ PO TBCR
20.0000 meq | EXTENDED_RELEASE_TABLET | Freq: Every day | ORAL | Status: DC
  Administered 2015-10-26: 20 meq via ORAL
  Filled 2015-10-25: qty 1

## 2015-10-25 MED ORDER — ATORVASTATIN CALCIUM 20 MG PO TABS
20.0000 mg | ORAL_TABLET | Freq: Every day | ORAL | Status: DC
  Administered 2015-10-26: 20 mg via ORAL
  Filled 2015-10-25: qty 1

## 2015-10-25 MED ORDER — SODIUM CHLORIDE 0.9% FLUSH
3.0000 mL | Freq: Two times a day (BID) | INTRAVENOUS | Status: DC
  Administered 2015-10-25 – 2015-10-27 (×4): 3 mL via INTRAVENOUS

## 2015-10-25 NOTE — H&P (Addendum)
History and Physical    Yvonne [name] XYI:[id number] DOB: July 18, 1918 DOA: 10/25/2015  Referring Provider: Dr. Criss Alvine  PCP: Gaye Alken, MD  Outpatient Specialists:  Dr. Antoine Poche (cardiologist)  Patient coming from: ALF  Chief Complaint: fall and hypoxia.  HPI: Yvonne [name] is a 82 y.o. female with PMH significant for diastolic heart failure, Atrial fibrillation (not on anticoagulation), hx of TIA/stroke, dementia, hypothyroidism, anxiety/depression; who presented from ALF for further evaluation after she fall today. Patient reports tripping while trying to get out of bed, but unable to specify which part of her body she injured. She endorses pain in her right costal area and right knee mainly. Patient denies HA's, blurred vision, LOC, nausea, vomiting, fever, abd pain, dysuria, diarrhea, coughing or any other complaints.  While in ED she was found to be hypoxic (O2 sat in mid 80's on RA); a CT scan showed interstitial edema and she was found to have elevated BNP.  ED Course: patient received 1 dose of IV lasix (40mg ) and TRH called for admission due to acute on chronic CHF.  Review of Systems:  All other systems reviewed and apart from HPI, are negative (level V apply from underlying dementia).  Past Medical History:  Diagnosis Date  . ARF (acute renal failure) (HCC)   . Hypercholesteremia   . Hypertension   . Paroxysmal atrial fibrillation (HCC)   . TIA (transient ischemic attack)     Past Surgical History:  Procedure Laterality Date  . BACK SURGERY    . ORIF WRIST FRACTURE Left 06/12/2014  . ORIF WRIST FRACTURE Left 06/12/2014   Procedure: OPEN REDUCTION INTERNAL FIXATION (ORIF) WRIST FRACTURE;  Surgeon: Bradly Bienenstock, MD;  Location: [facility] OR;  Service: Orthopedics;  Laterality: Left;     reports that she quit smoking about 32 years ago. She has never used smokeless tobacco. She reports that she does not drink alcohol or use drugs.  No Known  Allergies  Family History  Problem Relation Age of Onset  . Hypertension Mother   . Hypertension Father     Prior to Admission medications   Medication Sig Start Date End Date Taking? Authorizing Provider  acetaminophen (TYLENOL) 325 MG tablet Take 2 tablets (650 mg total) by mouth every 6 (six) hours as needed for fever, headache, mild pain or moderate pain. Patient taking differently: Take 650 mg by mouth 2 (two) times daily as needed for mild pain, moderate pain, fever or headache.  06/14/14  Yes Nonie Hoyer, PA-C  albuterol (PROVENTIL HFA;VENTOLIN HFA) 108 (90 BASE) MCG/ACT inhaler Inhale 2 puffs into the lungs every 6 (six) hours as needed for wheezing or shortness of breath. 04/09/13  Yes Catarina Hartshorn, MD  calcium-vitamin D (OSCAL WITH D) 500-200 MG-UNIT tablet Take 1 tablet by mouth daily with breakfast.   Yes Historical Provider, MD  clopidogrel (PLAVIX) 75 MG tablet Take 1 tablet (75 mg total) by mouth daily with breakfast. 04/09/13  Yes Catarina Hartshorn, MD  docusate sodium (COLACE) 100 MG capsule Take 1 capsule (100 mg total) by mouth 2 (two) times daily as needed for mild constipation. 06/14/14  Yes Nonie Hoyer, PA-C  Fluticasone-Salmeterol (ADVAIR DISKUS) 250-50 MCG/DOSE AEPB Inhale 1 puff into the lungs 2 (two) times daily. 08/30/14  Yes Ripudeep Jenna Luo, MD  furosemide (LASIX) 20 MG tablet Take 2 tablets (40 mg total) by mouth daily. Take 40mg  in am and 20mg  in pm Patient taking differently: Take 20-40 mg by mouth 2 (two) times daily. Take 40mg   in am and 20mg  in pm 05/26/15  Yes Zannie Cove, MD  levothyroxine (SYNTHROID, LEVOTHROID) 25 MCG tablet Take 25 mcg by mouth daily at 12 noon. 05/14/15  Yes Historical Provider, MD  LORazepam (ATIVAN) 0.5 MG tablet Take 0.5 mg by mouth every 6 (six) hours as needed for anxiety.   Yes Historical Provider, MD  magnesium hydroxide (MILK OF MAGNESIA) 400 MG/5ML suspension Take 30 mLs by mouth daily as needed for mild constipation.   Yes Historical Provider,  MD  metoprolol tartrate (LOPRESSOR) 25 MG tablet Take 37.5 mg by mouth 2 (two) times daily. 04/30/15  Yes Historical Provider, MD  Multiple Vitamins-Minerals (MULTIVITAMINS THER. W/MINERALS) TABS tablet Take 1 tablet by mouth daily.   Yes Historical Provider, MD  ondansetron (ZOFRAN) 4 MG tablet Take 4 mg by mouth every 6 (six) hours as needed for nausea or vomiting.   Yes Historical Provider, MD  polyethylene glycol (MIRALAX / GLYCOLAX) packet Take 17 g by mouth daily as needed. Patient taking differently: Take 17 g by mouth daily as needed for mild constipation.  06/14/14  Yes Nonie Hoyer, PA-C  potassium chloride SA (K-DUR,KLOR-CON) 20 MEQ tablet Take 20 mEq by mouth daily at 12 noon. 04/24/15  Yes Historical Provider, MD  sertraline (ZOLOFT) 50 MG tablet Take 50 mg by mouth daily.    Yes Historical Provider, MD  atorvastatin (LIPITOR) 20 MG tablet Take 1 tablet (20 mg total) by mouth daily at 6 PM. Patient not taking: Reported on 10/25/2015 08/07/14   Belkys A Regalado, MD  ipratropium-albuterol (DUONEB) 0.5-2.5 (3) MG/3ML SOLN Take 3 mLs by nebulization every 4 (four) hours as needed. Patient taking differently: Take 3 mLs by nebulization every 4 (four) hours as needed (shortness of breath).  01/27/15   Dorothea Ogle, MD    Physical Exam: Vitals:   10/25/15 1515 10/25/15 1530 10/25/15 1600 10/25/15 1644  BP:  179/92 171/99 (!) 158/87  Pulse: 110 98 87 95  Resp: 25 15 25    Temp:    97.6 F (36.4 C)  TempSrc:    Oral  SpO2: 97% 95% 96% 100%  Weight:    68.6 kg (151 lb 4.8 oz)  Height:    5\' 5"  (1.651 m)    Constitutional: in no acute distress, oriented X2; poor insight and hard of hearing. No fever and reporting some right costal pain with certain movements.  Eyes: PERRLA, lids and conjunctivae normal, no icterus or nystagmus  ENMT: Mucous membranes are moist. Posterior pharynx clear of any exudate or lesions. No thrush.  Neck: normal, supple, no masses, no thyromegaly Respiratory:  scattered rhonchi bibasilar; no frank crackles or wheezing on exam. Normal respiratory effort. No accessory muscle use.  Cardiovascular: irregular, no murmurs / rubs / gallops. Trace to 1+ edema bilaterally. No carotid bruits and no JVD appreciated  Abdomen: No distension, no tenderness, no masses palpated. No hepatosplenomegaly. Bowel sounds normal.  Musculoskeletal: no clubbing / cyanosis. No joint deformity upper and lower extremities. Good ROM, no contractures. Normal muscle tone.  Skin: chronic stasis dermatitis on her legs bilaterally, some abrasions on RLE from recent fall; no petechiae. No induration Neurologic: CN 2-12 grossly intact. DTR normal. Strength 4/5 in all 4 limbs (due to poor effort).  Psychiatric: oriented to person and place; poor insight. No agitation.    Labs on Admission: I have personally reviewed following labs and imaging studies  CBC:  Recent Labs Lab 10/25/15 1122  WBC 6.9  NEUTROABS 5.5  HGB 14.0  HCT 43.6  MCV 100.0  PLT 148*   Basic Metabolic Panel:  Recent Labs Lab 10/25/15 1122  NA 137  K 4.1  CL 100*  CO2 27  GLUCOSE 109*  BUN 38*  CREATININE 1.66*  CALCIUM 9.3   GFR: Estimated Creatinine Clearance: 19.7 mL/min (by C-G formula based on SCr of 1.66 mg/dL).  Urine analysis:    Component Value Date/Time   COLORURINE YELLOW 10/25/2015 1300   APPEARANCEUR CLEAR 10/25/2015 1300   LABSPEC 1.013 10/25/2015 1300   PHURINE 5.0 10/25/2015 1300   GLUCOSEU NEGATIVE 10/25/2015 1300   HGBUR NEGATIVE 10/25/2015 1300   BILIRUBINUR NEGATIVE 10/25/2015 1300   KETONESUR NEGATIVE 10/25/2015 1300   PROTEINUR NEGATIVE 10/25/2015 1300   UROBILINOGEN 0.2 01/24/2015 1055   NITRITE NEGATIVE 10/25/2015 1300   LEUKOCYTESUR SMALL (A) 10/25/2015 1300   Radiological Exams on Admission: Ct Chest Wo Contrast  Addendum Date: 10/25/2015   ADDENDUM REPORT: 10/25/2015 18:21 ADDENDUM: A 3 cm possible mass within the upper-outer right breast is identified.  Recommend elective diagnostic mammogram and ultrasound for further evaluation, as clinically indicated. Electronically Signed   By: Harmon Pier M.D.   On: 10/25/2015 18:21  Result Date: 10/25/2015 CLINICAL DATA:  82 year old female with recent fall and right chest and rib pain. Hypoxia. EXAM: CT CHEST WITHOUT CONTRAST TECHNIQUE: Multidetector CT imaging of the chest was performed following the standard protocol without IV contrast. COMPARISON:  10/25/2015 and prior chest radiographs FINDINGS: Cardiovascular: Cardiomegaly and heavy coronary artery calcifications noted. Thoracic aortic atherosclerotic calcifications noted without aneurysm. Mediastinum/Nodes: No mediastinal mass identified. Upper limits of normal bilateral mediastinal lymph nodes are nonspecific. No pericardial effusion. Lungs/Pleura: Interstitial thickening bilaterally identified and may represent edema. Dependent and bibasilar atelectasis identified. Diffuse peribronchial thickening noted. There is no evidence of pulmonary mass, pleural effusion or pneumothorax. Upper Abdomen: No acute abnormality. Aortic atherosclerotic calcifications noted. Musculoskeletal: No acute or suspicious abnormalities. Multiple remote right-sided rib fractures noted as well as a severe compression fracture of T7. IMPRESSION: Bilateral interstitial opacities likely representing pulmonary edema. Dependent and bibasilar atelectasis. No evidence of acute bony abnormality. Remote right rib and T7 fractures. Cardiomegaly, coronary disease and thoracic aortic atherosclerosis. Abdominal aortic atherosclerosis. Electronically Signed: By: Harmon Pier M.D. On: 10/25/2015 15:04  Dg Chest Portable 1 View  Result Date: 10/25/2015 CLINICAL DATA:  Pain after fall. EXAM: PORTABLE CHEST 1 VIEW COMPARISON:  May 22, 2015 FINDINGS: Right-sided rib fractures are again seen, unchanged in the interval. No pneumothorax. Stable cardiomegaly. No pulmonary nodules, masses, or focal  infiltrates. No other changes. IMPRESSION: No active disease. Electronically Signed   By: Gerome Sam III M.D   On: 10/25/2015 11:05   EKG:  Non specific T waves abnormalities; A. Fib appreciated; regular rate.  Assessment/Plan 1-Hypoxia: due to acute on chronic diastolic HF. -Patient with mild SOB; no Orthopnea. -Also with BNP elevated and CT chest demonstrating bilateral interstitial edema  -patient denies CP, currently no tachycardic and w/o hx of recent surgery or immobilization. given hypoxia and hx of A. Fib/PE part of differential, will check D-dimer. -her presentation is most likely due to mild acute on chronic diastolic CHF (had hx of it in the past) -will admit to telemetry, follow CHF pathway -will check TSH and troponin  -will start tx with IV lasix -continue b-blocker (home dose) -repeat BMET and CXR in am -electrolytes repletion as needed  -if D-dimer is elevated, will check V/Q scan  -continue PRN oxygen supplementation -last echo with preserved EF and demonstrating  diastolic dysfunction (04/2015)  2- hx of PAF -not a candidate for anticoagulation -CHADsVASC score 5 -will monitor on telemetry -continue b-blocker for rate control  3-History of CVA (cerebrovascular accident) -will continue statins, ASA and plavix -no new focal deficit appreciated   4-HTN (hypertension): -will continue metoprolol and lasix for BP control  5-CKD (chronic kidney disease) stage 3-4 -appears to be at baseline -will monitor closely give IV diuresis  6-Fall: -will follow PT rec's -but appears to be an intermittent problem for this patient -currently living at ALF ([facility])  7-Acute on chronic diastolic heart failure (HCC): -treatment as mentioned above -daily weights Low sodium diet -strict intake and output  8-hypothyroidism: -will check TSH and free T4 -will continue synthroid  9-Dementia without behavioral disturbance -continue supportive care  10-hx of  anxiety and depression -will continue zoloft and PRN ativan  11-hx of COPD -no wheezing and with good air movement bilaterally -will continue Dulera and PRN duoneb    DVT prophylaxis: heparin  Code Status: DNR Family Communication: friend/significant other at bedside (with patient authorization/permission to disclose information) Disposition Plan: back to ALF in the next 24-28 hours max Consults called: none  Admission status: observation, LOS < 2 midnights, telemetry bed    Vassie Loll MD [facility] Pager [phone]  If 7PM-7AM, please contact night-coverage [email or web address] Password [facility]  10/25/2015, 6:37 PM

## 2015-10-25 NOTE — ED Triage Notes (Signed)
Pt arrives from [facility] via [place]. Pt states she slid out of her bed this morning and has c/o right sided rib pain. Pt also has bilateral skin tears to the knees. Pt is on Plavix, denies hitting head or LOC.

## 2015-10-25 NOTE — ED Provider Notes (Signed)
[facility]-EMERGENCY DEPT Provider Note   CSN: [id number] Arrival date & time: 10/25/15  1027  First Provider Contact:  First MD Initiated Contact with Patient 10/25/15 1045     History   Chief Complaint Chief Complaint  Patient presents with  . Fall    HPI   Level V Caveat due to dementia  R[place] K Marovich is an 82 y.o. female with history of paroxysmal afib (now on ASA, plavix), CHF, CKD, HTN, TIA and stroke who presents to the ED for evaluation after a fall. Per EMS pt slid out of bed this morning and fell to the ground. Pt states she was getting out of bed and the next thing she knew she was falling to the ground. She states she fell straight forward to her hands and knees. She states she assumed she tripped. She is now complaining of localized lateral right rib/chest pain. She states the pain does not radiate. She denies shortness of breath. Denies pain anywhere else.  Of note, pt de-sats to low 80s on room air, even as low as 77%. Pt does not have an oxygen requirement at baseline. Sats improve to mid 90s with 2L via [place].   Ms. Larouche is DNR.  Past Medical History:  Diagnosis Date  . ARF (acute renal failure) (HCC)   . Hypercholesteremia   . Hypertension   . Paroxysmal atrial fibrillation (HCC)   . TIA (transient ischemic attack)     Patient Active Problem List   Diagnosis Date Noted  . Atrial fibrillation with RVR (HCC)   . Left knee pain   . Pneumonia   . CHF (congestive heart failure) (HCC) 05/20/2015  . Acute congestive heart failure (HCC)   . Acute respiratory failure with hypoxia (HCC) 01/24/2015  . Acute on chronic diastolic CHF (congestive heart failure), NYHA class 2 (HCC) 01/24/2015  . Hypertensive urgency 01/24/2015  . HTN (hypertension) 08/08/2014  . CKD (chronic kidney disease) stage 3, GFR 30-59 ml/min 08/08/2014  . History of CVA (cerebrovascular accident) 08/05/2014  . Frequent falls 08/05/2014  . Frequency of urination 08/05/2014  . Cognitive  impairment 08/05/2014  . COPD (chronic obstructive pulmonary disease) (HCC) 08/05/2014  . Distal radius fracture, left 06/12/2014  . Wrist fracture, closed 06/12/2014  . Other specified cardiac dysrhythmias(427.89) 04/08/2013  . Hypomagnesemia 04/07/2013  . Abnormal urinalysis 04/05/2013  . Dehydration 04/05/2013  . Orthostatic hypotension 04/05/2013  . Hypokalemia 04/05/2013  . Acute CVA (cerebrovascular accident)-nonhemorrhagic 04/04/2013    Past Surgical History:  Procedure Laterality Date  . BACK SURGERY    . ORIF WRIST FRACTURE Left 06/12/2014  . ORIF WRIST FRACTURE Left 06/12/2014   Procedure: OPEN REDUCTION INTERNAL FIXATION (ORIF) WRIST FRACTURE;  Surgeon: Bradly Bienenstock, MD;  Location: [facility] OR;  Service: Orthopedics;  Laterality: Left;    OB History    No data available       Home Medications    Prior to Admission medications   Medication Sig Start Date End Date Taking? Authorizing Provider  acetaminophen (TYLENOL) 325 MG tablet Take 2 tablets (650 mg total) by mouth every 6 (six) hours as needed for fever, headache, mild pain or moderate pain. Patient taking differently: Take 650 mg by mouth 2 (two) times daily as needed for fever, headache, mild pain or moderate pain.  06/14/14   Nonie Hoyer, PA-C  albuterol (PROVENTIL HFA;VENTOLIN HFA) 108 (90 BASE) MCG/ACT inhaler Inhale 2 puffs into the lungs every 6 (six) hours as needed for wheezing or shortness of breath. 04/09/13  Catarina Hartshorn, MD  atorvastatin (LIPITOR) 20 MG tablet Take 1 tablet (20 mg total) by mouth daily at 6 PM. 08/07/14   Belkys A Regalado, MD  calcium-vitamin D (OSCAL WITH D) 500-200 MG-UNIT tablet Take 1 tablet by mouth daily with breakfast.    Historical Provider, MD  clopidogrel (PLAVIX) 75 MG tablet Take 1 tablet (75 mg total) by mouth daily with breakfast. 04/09/13   Catarina Hartshorn, MD  docusate sodium (COLACE) 100 MG capsule Take 1 capsule (100 mg total) by mouth 2 (two) times daily as needed for mild  constipation. 06/14/14   Nonie Hoyer, PA-C  Fluticasone-Salmeterol (ADVAIR DISKUS) 250-50 MCG/DOSE AEPB Inhale 1 puff into the lungs 2 (two) times daily. 08/30/14   Ripudeep Jenna Luo, MD  furosemide (LASIX) 20 MG tablet Take 2 tablets (40 mg total) by mouth daily. Take 40mg  in am and 20mg  in pm 05/26/15   Zannie Cove, MD  ipratropium-albuterol (DUONEB) 0.5-2.5 (3) MG/3ML SOLN Take 3 mLs by nebulization every 4 (four) hours as needed. Patient taking differently: Take 3 mLs by nebulization every 4 (four) hours as needed (shortness of breath).  01/27/15   Dorothea Ogle, MD  levofloxacin (LEVAQUIN) 750 MG tablet Take 1 tablet (750 mg total) by mouth every other day. For 1 day 05/26/15   Zannie Cove, MD  levothyroxine (SYNTHROID, LEVOTHROID) 25 MCG tablet Take 25 mcg by mouth daily at 12 noon. 05/14/15   Historical Provider, MD  LORazepam (ATIVAN) 0.5 MG tablet Take 0.5 mg by mouth every 6 (six) hours as needed for anxiety.    Historical Provider, MD  magnesium hydroxide (MILK OF MAGNESIA) 400 MG/5ML suspension Take 30 mLs by mouth daily as needed for mild constipation.    Historical Provider, MD  metoprolol tartrate (LOPRESSOR) 25 MG tablet Take 37.5 mg by mouth 2 (two) times daily. 04/30/15   Historical Provider, MD  Multiple Vitamins-Minerals (MULTIVITAMINS THER. W/MINERALS) TABS tablet Take 1 tablet by mouth daily.    Historical Provider, MD  ondansetron (ZOFRAN) 4 MG tablet Take 4 mg by mouth every 6 (six) hours as needed for nausea or vomiting.    Historical Provider, MD  polyethylene glycol (MIRALAX / GLYCOLAX) packet Take 17 g by mouth daily as needed. Patient taking differently: Take 17 g by mouth daily as needed for mild constipation.  06/14/14   Nonie Hoyer, PA-C  potassium chloride SA (K-DUR,KLOR-CON) 20 MEQ tablet Take 20 mEq by mouth daily at 12 noon. 04/24/15   Historical Provider, MD  sertraline (ZOLOFT) 50 MG tablet Take 50 mg by mouth daily.     Historical Provider, MD    Family  History Family History  Problem Relation Age of Onset  . Hypertension Mother   . Hypertension Father     Social History Social History  Substance Use Topics  . Smoking status: Former Smoker    Quit date: 04/05/1983  . Smokeless tobacco: Never Used  . Alcohol use No     Allergies   Review of patient's allergies indicates no known allergies.   Review of Systems Review of Systems 10 Systems reviewed and are negative for acute change except as noted in the HPI.   Physical Exam Updated Vital Signs BP 163/99   Pulse 98   Temp 97.7 F (36.5 C) (Oral)   SpO2 (!) 89%   Physical Exam  Constitutional: She is oriented to person, place, and time.  Elderly, frail, NAD  HENT:  Head: Atraumatic.  Right Ear: External ear normal.  Left Ear: External ear normal.  Nose: Nose normal.  Mouth/Throat: Oropharynx is clear and moist. No oropharyngeal exudate.  Eyes: Conjunctivae and EOM are normal. Pupils are equal, round, and reactive to light.  Neck: Normal range of motion. Neck supple.  No c-spine tenderness  Cardiovascular: Normal rate, regular rhythm, normal heart sounds and intact distal pulses.   Pulmonary/Chest: Effort normal. No respiratory distress. She has no wheezes.    Diminished breath sounds throughout No tachypnea or increased WOB Tenderness as depicted  Abdominal: Soft. Bowel sounds are normal. She exhibits no distension. There is no tenderness. There is no rebound and no guarding.  Musculoskeletal: She exhibits no edema.  No midline back tenderness Some superficial abrasions to bilateral knees, no tenderness. FROM.   Neurological: She is alert and oriented to person, place, and time. No cranial nerve deficit.  Skin: Skin is warm and dry.  Psychiatric: She has a normal mood and affect.  Nursing note and vitals reviewed.    ED Treatments / Results  Labs (all labs ordered are listed, but only abnormal results are displayed) Labs Reviewed  BASIC METABOLIC PANEL -  Abnormal; Notable for the following:       Result Value   Chloride 100 (*)    Glucose, Bld 109 (*)    BUN 38 (*)    Creatinine, Ser 1.66 (*)    GFR calc non Af Amer 25 (*)    GFR calc Af Amer 29 (*)    All other components within normal limits  CBC WITH DIFFERENTIAL/PLATELET - Abnormal; Notable for the following:    Platelets 148 (*)    Lymphs Abs 0.6 (*)    All other components within normal limits  BRAIN NATRIURETIC PEPTIDE - Abnormal; Notable for the following:    B Natriuretic Peptide 834.9 (*)    All other components within normal limits  URINE MICROSCOPIC-ADD ON - Abnormal; Notable for the following:    Squamous Epithelial / LPF 0-5 (*)    Casts HYALINE CASTS (*)    Crystals CA OXALATE CRYSTALS (*)    All other components within normal limits  URINALYSIS, ROUTINE W REFLEX MICROSCOPIC (NOT AT [facility]) - Abnormal; Notable for the following:    Leukocytes, UA SMALL (*)    All other components within normal limits  URINE MICROSCOPIC-ADD ON - Abnormal; Notable for the following:    Squamous Epithelial / LPF 0-5 (*)    Bacteria, UA RARE (*)    Casts HYALINE CASTS (*)    All other components within normal limits  I-STAT TROPOININ, ED    EKG  EKG Interpretation  Date/Time:  Saturday October 25 2015 11:28:38 EDT Ventricular Rate:  86 PR Interval:    QRS Duration: 95 QT Interval:  374 QTC Calculation: 448 R Axis:   -69 Text Interpretation:  Atrial fibrillation Ventricular premature complex Left anterior fascicular block Low voltage, extremity leads Nonspecific T abnormalities, lateral leads no significant change since Feb 2017 Confirmed by Criss Alvine MD, SCOTT [phone]) on 10/25/2015 11:59:11 AM       Radiology Ct Chest Wo Contrast  Result Date: 10/25/2015 CLINICAL DATA:  82 year old female with recent fall and right chest and rib pain. Hypoxia. EXAM: CT CHEST WITHOUT CONTRAST TECHNIQUE: Multidetector CT imaging of the chest was performed following the standard protocol without IV  contrast. COMPARISON:  10/25/2015 and prior chest radiographs FINDINGS: Cardiovascular: Cardiomegaly and heavy coronary artery calcifications noted. Thoracic aortic atherosclerotic calcifications noted without aneurysm. Mediastinum/Nodes: No mediastinal mass identified. Upper limits of  normal bilateral mediastinal lymph nodes are nonspecific. No pericardial effusion. Lungs/Pleura: Interstitial thickening bilaterally identified and may represent edema. Dependent and bibasilar atelectasis identified. Diffuse peribronchial thickening noted. There is no evidence of pulmonary mass, pleural effusion or pneumothorax. Upper Abdomen: No acute abnormality. Aortic atherosclerotic calcifications noted. Musculoskeletal: No acute or suspicious abnormalities. Multiple remote right-sided rib fractures noted as well as a severe compression fracture of T7. IMPRESSION: Bilateral interstitial opacities likely representing pulmonary edema. Dependent and bibasilar atelectasis. No evidence of acute bony abnormality. Remote right rib and T7 fractures. Cardiomegaly, coronary disease and thoracic aortic atherosclerosis. Abdominal aortic atherosclerosis. Electronically Signed   By: Harmon Pier M.D.   On: 10/25/2015 15:04  Dg Chest Portable 1 View  Result Date: 10/25/2015 CLINICAL DATA:  Pain after fall. EXAM: PORTABLE CHEST 1 VIEW COMPARISON:  May 22, 2015 FINDINGS: Right-sided rib fractures are again seen, unchanged in the interval. No pneumothorax. Stable cardiomegaly. No pulmonary nodules, masses, or focal infiltrates. No other changes. IMPRESSION: No active disease. Electronically Signed   By: Gerome Ruchy Wildrick III M.D   On: 10/25/2015 11:05   Procedures Procedures (including critical care time)  Medications Ordered in ED Medications  ipratropium-albuterol (DUONEB) 0.5-2.5 (3) MG/3ML nebulizer solution 3 mL (3 mLs Nebulization Given 10/25/15 1314)  acetaminophen (TYLENOL) tablet 650 mg (650 mg Oral Given 10/25/15 1410)    furosemide (LASIX) injection 40 mg (40 mg Intravenous Given 10/25/15 1531)     Initial Impression / Assessment and Plan / ED Course  I have reviewed the triage vital signs and the nursing notes.  Pertinent labs & imaging results that were available during my care of the patient were reviewed by me and considered in my medical decision making (see chart for details).  Pt is an 82 y.o. female presenting after likely mechanical fall this AM at her assisted living facility. She has no acute injury found during workup. However, she continues to de-sat to 77-83% on room air. Will improve to mid 90s when placed back on 2L via [place]. Her workup reveals bilateral pulmonary edema on CT chest. BNP is elevated to 834 though it is chronically elevated. We had also considered CT angio for PE workup. However, GFR is too low. Given her persistent hypoxia on room air we will consult the hospitalist team for admission, though I'm not entirely clear on the etiology of pt's hypoxia. Clinically she does not appear in respiratory distress. I ordered a dose of IV lasix in the ED. Can consider V/Q scan if her oxygen status does not improve.   3:55 PM I spoke with Dr. Gwenlyn Perking who will admit pt to tele obs. Appreciate assistance.  Final Clinical Impressions(s) / ED Diagnoses   Final diagnoses:  Acute respiratory failure with hypoxia (HCC)  Acute on chronic congestive heart failure, unspecified congestive heart failure type [facility])  Renal insufficiency    New Prescriptions New Prescriptions   No medications on file     Carlene Coria, Cordelia Poche 10/25/15 1555    Pricilla Loveless, MD 10/25/15 [phone]

## 2015-10-25 NOTE — ED Notes (Signed)
Patient transported to CT 

## 2015-10-25 NOTE — ED Notes (Signed)
Patient stable at this time for transport.  EMT will transport patient to 3E bed 14 at this time.

## 2015-10-25 NOTE — Progress Notes (Signed)
Pt arrived to the unit, MD paged. Friend at the bedside.

## 2015-10-25 NOTE — ED Notes (Signed)
Report attempted x 1

## 2015-10-25 NOTE — ED Notes (Signed)
Patient returned from CT

## 2015-10-25 NOTE — ED Notes (Signed)
Pt 77% on RA, placed on 2L [place].

## 2015-10-26 ENCOUNTER — Observation Stay (HOSPITAL_COMMUNITY): Payer: Medicare Other

## 2015-10-26 ENCOUNTER — Observation Stay (HOSPITAL_BASED_OUTPATIENT_CLINIC_OR_DEPARTMENT_OTHER): Payer: Medicare Other

## 2015-10-26 DIAGNOSIS — I5033 Acute on chronic diastolic (congestive) heart failure: Secondary | ICD-10-CM | POA: Diagnosis not present

## 2015-10-26 DIAGNOSIS — I10 Essential (primary) hypertension: Secondary | ICD-10-CM

## 2015-10-26 DIAGNOSIS — I509 Heart failure, unspecified: Secondary | ICD-10-CM

## 2015-10-26 DIAGNOSIS — N183 Chronic kidney disease, stage 3 (moderate): Secondary | ICD-10-CM

## 2015-10-26 LAB — TROPONIN I: Troponin I: <0.03 ng/mL (ref <0.03)

## 2015-10-26 LAB — BASIC METABOLIC PANEL
ANION GAP: 14 (ref 5–15)
BUN: 42 mg/dL — ABNORMAL HIGH (ref 6–20)
CALCIUM: 8.9 mg/dL (ref 8.9–10.3)
CO2: 28 mmol/L (ref 22–32)
CREATININE: 1.52 mg/dL — AB (ref 0.44–1.00)
Chloride: 96 mmol/L — ABNORMAL LOW (ref 101–111)
GFR, EST AFRICAN AMERICAN: 32 mL/min — AB (ref >60)
GFR, EST NON AFRICAN AMERICAN: 28 mL/min — AB (ref >60)
Glucose, Bld: 105 mg/dL — ABNORMAL HIGH (ref 65–99)
Potassium: 4.8 mmol/L (ref 3.5–5.1)
Sodium: 138 mmol/L (ref 135–145)

## 2015-10-26 LAB — ECHOCARDIOGRAM COMPLETE
FS: 19 % — AB (ref 28–44)
HEIGHTINCHES: 65 in
IVS/LV PW RATIO, ED: 0.82
LA ID, A-P, ES: 44 mm
LA diam end sys: 44 mm
LA diam index: 2.53 cm/m2
LA vol A4C: 91.6 ml
LA vol index: 51 mL/m2
LAVOL: 88.7 mL
LV PW d: 13 mm — AB (ref 0.6–1.1)
LVOT area: 2.84 cm2
LVOT diameter: 19 mm
RV LATERAL S' VELOCITY: 9 cm/s
WEIGHTICAEL: 2372.8 [oz_av]

## 2015-10-26 LAB — MRSA PCR SCREENING: MRSA BY PCR: NEGATIVE

## 2015-10-26 MED ORDER — CALCIUM CARBONATE-VITAMIN D 500-200 MG-UNIT PO TABS
1.0000 | ORAL_TABLET | Freq: Every day | ORAL | Status: DC
  Administered 2015-10-26 – 2015-10-27 (×2): 1 via ORAL
  Filled 2015-10-26 (×2): qty 1

## 2015-10-26 MED ORDER — FUROSEMIDE 10 MG/ML IJ SOLN
40.0000 mg | Freq: Every day | INTRAMUSCULAR | Status: DC
  Administered 2015-10-27: 40 mg via INTRAVENOUS
  Filled 2015-10-26: qty 4

## 2015-10-26 MED ORDER — CLOPIDOGREL BISULFATE 75 MG PO TABS
75.0000 mg | ORAL_TABLET | Freq: Every day | ORAL | Status: DC
  Administered 2015-10-26 – 2015-10-27 (×2): 75 mg via ORAL
  Filled 2015-10-26 (×2): qty 1

## 2015-10-26 MED ORDER — HYDRALAZINE HCL 10 MG PO TABS
10.0000 mg | ORAL_TABLET | Freq: Three times a day (TID) | ORAL | Status: DC
  Administered 2015-10-26 – 2015-10-27 (×4): 10 mg via ORAL
  Filled 2015-10-26 (×4): qty 1

## 2015-10-26 NOTE — Progress Notes (Addendum)
Patient ID: Yvonne [name], female   DOB: [birth year], 82 y.o.   MRN: [id number]                                                                PROGRESS NOTE                                                                                                                                                                                                             Patient Demographics:    Yvonne [name], is a 82 y.o. female, DOB - 11-19-1918, [id number]  Admit date - 10/25/2015   Admitting Physician Vassie Loll, MD  Outpatient Primary MD for the patient is Gaye Alken, MD  LOS - 0  Outpatient Specialists: Dr. Antoine Poche (cardiologist)  Chief Complaint  Patient presents with  . Fall       Brief Narrative   82 y.o. female with PMH significant for diastolic heart failure, Atrial fibrillation (not on anticoagulation), hx of TIA/stroke, dementia, hypothyroidism, anxiety/depression; who presented from ALF for further evaluation after she fall today. Patient reports tripping while trying to get out of bed, but unable to specify which part of her body she injured. She endorses pain in her right costal area and right knee mainly. Patient denies HA's, blurred vision, LOC, nausea, vomiting, fever, abd pain, dysuria, diarrhea, coughing or any other complaints.  While in ED she was found to be hypoxic (O2 sat in mid 80's on RA); a CT scan showed interstitial edema and she was found to have elevated BNP.  ED Course: patient received 1 dose of IV lasix (40mg ) and TRH called for admission due to acute on chronic CHF.   Subjective:    [place] today does not appear to have  C/o headache, chest pain, abdominal pain , Nausea, new weakness tingling or numbness, Cough - Sob (note history limited by dementia)   Assessment  & Plan :    Principal Problem:   Acute on chronic diastolic CHF (congestive heart failure), NYHA class 2 (HCC) Active Problems:   History of CVA (cerebrovascular  accident)   HTN (hypertension)   CKD (chronic kidney disease) stage 3, GFR 30-59 ml/min   Hypoxia   Fall   Acute on chronic diastolic heart failure (HCC)   SOB (shortness of breath)  Lewy body dementia without behavioral disturbance  Hypoxia: Acute on Chronic CHF (EF 60-65%, Mild AR, 05/23/2015) Trop I q6hx 3,  Check cardiac echo Lasix 40mg  iv bid => lasix 40mg  iv qday Though D dimer was elevated (mildly) , pt is no longer tachycardic and has explanation for her hypoxia, will not pursue VQ  Pafib Not on anticoagulation presumed to fall risk  History of CVA (cerebrovascular accident) continue statin, ASA, plavix -no new focal deficit appreciated   HTN (hypertension): continue metoprolol and lasix  Add hydralazine 10mg  po tid for bp control  CKD (chronic kidney disease) stage 3-4 -appears to be at baseline -will monitor closely give IV diuresis  Fall: -will follow PT rec's -but appears to be an intermittent problem for this patient -currently living at ALF ([facility])   Hypothyroidism: -will check TSH and free T4 -will continue synthroid  Dementia without behavioral disturbance -continue supportive care  hx of anxiety and depression -will continue zoloft and PRN ativan  Hx of COPD -no wheezing and with good air movement bilaterally -will continue Dulera and PRN duoneb     Code Status : DNR  Family Communication  :   Disposition Plan  : SNF or ALF  Barriers For Discharge :   Consults  :    Procedures  :   DVT Prophylaxis  :   Heparin - SCDs   Lab Results  Component Value Date   PLT 148 (L) 10/25/2015    Antibiotics  :    Anti-infectives    None        Objective:   Vitals:   10/25/15 2148 10/26/15 0016 10/26/15 0407 10/26/15 0527  BP:  (!) 146/79 (!) 159/77   Pulse:  86 82   Resp:  16 16   Temp:  98.5 F (36.9 C) 97.7 F (36.5 C)   TempSrc:  Oral Oral   SpO2: 96% 92% 96%   Weight:    67.3 kg (148 lb 4.8 oz)  Height:         Wt Readings from Last 3 Encounters:  10/26/15 67.3 kg (148 lb 4.8 oz)  05/26/15 66.8 kg (147 lb 4.3 oz)  01/27/15 72.1 kg (158 lb 15.2 oz)     Intake/Output Summary (Last 24 hours) at 10/26/15 0752 Last data filed at 10/26/15 0600  Gross per 24 hour  Intake              240 ml  Output             2100 ml  Net            -1860 ml     Physical Exam  Awake Alert,  No new F.N deficits, Normal affect [place].AT,PERRAL Supple Neck,No JVD, No cervical lymphadenopathy appriciated.  Symmetrical Chest wall movement, Good air movement bilaterally, + bibasilar crackles RRR,No Gallops,Rubs.  1/6 sem rusb No Parasternal Heave +ve B.Sounds, Abd Soft, No tenderness, No organomegaly appriciated, No rebound - guarding or rigidity. No Cyanosis, Clubbing or edema, No new Rash or bruise      Data Review:    CBC  Recent Labs Lab 10/25/15 1122  WBC 6.9  HGB 14.0  HCT 43.6  PLT 148*  MCV 100.0  MCH 32.1  MCHC 32.1  RDW 15.2  LYMPHSABS 0.6*  MONOABS 0.6  EOSABS 0.1  BASOSABS 0.0    Chemistries   Recent Labs Lab 10/25/15 1122 10/25/15 1901  NA 137  --   K 4.1  --  CL 100*  --   CO2 27  --   GLUCOSE 109*  --   BUN 38*  --   CREATININE 1.66*  --   CALCIUM 9.3  --   MG  --  2.1   ------------------------------------------------------------------------------------------------------------------ No results for input(s): CHOL, HDL, LDLCALC, TRIG, CHOLHDL, LDLDIRECT in the last 72 hours.  Lab Results  Component Value Date   HGBA1C 6.4 (H) 05/21/2015   ------------------------------------------------------------------------------------------------------------------  Recent Labs  10/25/15 1901  TSH 3.207   ------------------------------------------------------------------------------------------------------------------ No results for input(s): VITAMINB12, FOLATE, FERRITIN, TIBC, IRON, RETICCTPCT in the last 72 hours.  Coagulation profile No results for input(s): INR,  PROTIME in the last 168 hours.   Recent Labs  10/25/15 1901  DDIMER 1.59*    Cardiac Enzymes  Recent Labs Lab 10/25/15 1901 10/26/15 0035  TROPONINI <0.03 <0.03   ------------------------------------------------------------------------------------------------------------------    Component Value Date/Time   BNP 834.9 (H) 10/25/2015 1123    Inpatient Medications  Scheduled Meds: . aspirin EC  81 mg Oral Daily  . atorvastatin  20 mg Oral q1800  . calcium-vitamin D  1 tablet Oral Q breakfast  . clopidogrel  75 mg Oral Q breakfast  . heparin  5,000 Units Subcutaneous Q8H  . levothyroxine  25 mcg Oral QAC breakfast  . metoprolol tartrate  37.5 mg Oral BID  . mometasone-formoterol  2 puff Inhalation BID  . multivitamin with minerals  1 tablet Oral Daily  . potassium chloride SA  20 mEq Oral Q1200  . sertraline  50 mg Oral Daily  . sodium chloride flush  3 mL Intravenous Q12H   Continuous Infusions:  PRN Meds:.sodium chloride, acetaminophen, docusate sodium, ipratropium-albuterol, LORazepam, ondansetron (ZOFRAN) IV, polyethylene glycol, sodium chloride flush  Micro Results No results found for this or any previous visit (from the past 240 hour(s)).  Radiology Reports Dg Chest 2 View  Result Date: 10/26/2015 CLINICAL DATA:  Acute shortness of breath today. EXAM: CHEST  2 VIEW COMPARISON:  10/25/2015 and prior exam FINDINGS: Cardiomegaly identified. Mild interstitial opacities are now noted and question mild interstitial edema. Mild bibasilar atelectasis again noted. There is no evidence of pneumothorax. No other changes identified. IMPRESSION: Cardiomegaly with mild interstitial pulmonary edema. Electronically Signed   By: Harmon Pier M.D.   On: 10/26/2015 07:31  Ct Chest Wo Contrast  Addendum Date: 10/25/2015   ADDENDUM REPORT: 10/25/2015 18:21 ADDENDUM: A 3 cm possible mass within the upper-outer right breast is identified. Recommend elective diagnostic mammogram and  ultrasound for further evaluation, as clinically indicated. Electronically Signed   By: Harmon Pier M.D.   On: 10/25/2015 18:21  Result Date: 10/25/2015 CLINICAL DATA:  82 year old female with recent fall and right chest and rib pain. Hypoxia. EXAM: CT CHEST WITHOUT CONTRAST TECHNIQUE: Multidetector CT imaging of the chest was performed following the standard protocol without IV contrast. COMPARISON:  10/25/2015 and prior chest radiographs FINDINGS: Cardiovascular: Cardiomegaly and heavy coronary artery calcifications noted. Thoracic aortic atherosclerotic calcifications noted without aneurysm. Mediastinum/Nodes: No mediastinal mass identified. Upper limits of normal bilateral mediastinal lymph nodes are nonspecific. No pericardial effusion. Lungs/Pleura: Interstitial thickening bilaterally identified and may represent edema. Dependent and bibasilar atelectasis identified. Diffuse peribronchial thickening noted. There is no evidence of pulmonary mass, pleural effusion or pneumothorax. Upper Abdomen: No acute abnormality. Aortic atherosclerotic calcifications noted. Musculoskeletal: No acute or suspicious abnormalities. Multiple remote right-sided rib fractures noted as well as a severe compression fracture of T7. IMPRESSION: Bilateral interstitial opacities likely representing pulmonary edema. Dependent and bibasilar atelectasis.  No evidence of acute bony abnormality. Remote right rib and T7 fractures. Cardiomegaly, coronary disease and thoracic aortic atherosclerosis. Abdominal aortic atherosclerosis. Electronically Signed: By: Harmon Pier M.D. On: 10/25/2015 15:04  Dg Chest Portable 1 View  Result Date: 10/25/2015 CLINICAL DATA:  Pain after fall. EXAM: PORTABLE CHEST 1 VIEW COMPARISON:  May 22, 2015 FINDINGS: Right-sided rib fractures are again seen, unchanged in the interval. No pneumothorax. Stable cardiomegaly. No pulmonary nodules, masses, or focal infiltrates. No other changes. IMPRESSION: No  active disease. Electronically Signed   By: Gerome Sam III M.D   On: 10/25/2015 11:05   Time Spent in minutes  30   Pearson Grippe M.D on 10/26/2015 at 7:52 AM  Between 7am to 7pm - Pager - [phone]  After 7pm go to [email or web address] - password [facility]  [facility] -  Office  [phone]

## 2015-10-26 NOTE — Progress Notes (Signed)
  Echocardiogram 2D Echocardiogram has been performed.  Delcie Roch 10/26/2015, 3:40 PM

## 2015-10-27 ENCOUNTER — Encounter (HOSPITAL_COMMUNITY): Payer: Self-pay | Admitting: Internal Medicine

## 2015-10-27 DIAGNOSIS — N183 Chronic kidney disease, stage 3 (moderate): Secondary | ICD-10-CM | POA: Diagnosis not present

## 2015-10-27 DIAGNOSIS — I10 Essential (primary) hypertension: Secondary | ICD-10-CM | POA: Diagnosis not present

## 2015-10-27 DIAGNOSIS — I5033 Acute on chronic diastolic (congestive) heart failure: Secondary | ICD-10-CM | POA: Diagnosis not present

## 2015-10-27 LAB — COMPREHENSIVE METABOLIC PANEL
ALK PHOS: 38 U/L (ref 38–126)
ALT: 24 U/L (ref 14–54)
ANION GAP: 9 (ref 5–15)
AST: 33 U/L (ref 15–41)
Albumin: 3.6 g/dL (ref 3.5–5.0)
BILIRUBIN TOTAL: 1.4 mg/dL — AB (ref 0.3–1.2)
BUN: 49 mg/dL — ABNORMAL HIGH (ref 6–20)
CALCIUM: 8.7 mg/dL — AB (ref 8.9–10.3)
CO2: 31 mmol/L (ref 22–32)
Chloride: 98 mmol/L — ABNORMAL LOW (ref 101–111)
Creatinine, Ser: 1.58 mg/dL — ABNORMAL HIGH (ref 0.44–1.00)
GFR calc non Af Amer: 27 mL/min — ABNORMAL LOW (ref >60)
GFR, EST AFRICAN AMERICAN: 31 mL/min — AB (ref >60)
Glucose, Bld: 125 mg/dL — ABNORMAL HIGH (ref 65–99)
Potassium: 4.7 mmol/L (ref 3.5–5.1)
SODIUM: 138 mmol/L (ref 135–145)
TOTAL PROTEIN: 6.7 g/dL (ref 6.5–8.1)

## 2015-10-27 LAB — CBC
HEMATOCRIT: 42.5 % (ref 36.0–46.0)
HEMOGLOBIN: 13.2 g/dL (ref 12.0–15.0)
MCH: 31.4 pg (ref 26.0–34.0)
MCHC: 31.1 g/dL (ref 30.0–36.0)
MCV: 101 fL — ABNORMAL HIGH (ref 78.0–100.0)
Platelets: 163 10*3/uL (ref 150–400)
RBC: 4.21 MIL/uL (ref 3.87–5.11)
RDW: 15.4 % (ref 11.5–15.5)
WBC: 6.9 10*3/uL (ref 4.0–10.5)

## 2015-10-27 MED ORDER — ASPIRIN 81 MG PO TBEC: 81.0000 mg | DELAYED_RELEASE_TABLET | Freq: Every day | ORAL | 0 refills | Status: DC

## 2015-10-27 NOTE — Evaluation (Signed)
Physical Therapy Evaluation Patient Details Name: Yvonne [name] MRN: [id number] DOB: 1918/07/08 Today's Date: 10/27/2015   History of Present Illness  Patient is a 82 y/o female with hx of COPD, CVA, chronic diastlic CHF, dementia, depression/anxiety presents from ALF s/p fall. CT chest-Remote right rib and T7 fx. Bilateral interstitial opacities likely representing pulmonary.  Clinical Impression  Patient presents with generalized weakness, pain, dyspnea on exertion and baseline cognitive deficits impacting mobility. Pt with very short term memory and poor safety awareness. Pt from [facility]. Difficult to get accurate PLOF/history due to dementia. Requires Min A for balance/safety during gait training and limited by pain everywhere esp on right side. Concerned about pt be OOB without supervision. If ALF can provide closer supervision, pt safe to return there but if not, may need short term SNF. Will follow acutely to maximize independence and mobility prior to discharge.    Follow Up Recommendations Home health PT;Supervision for mobility/OOB    Equipment Recommendations       Recommendations for Other Services       Precautions / Restrictions Precautions Precautions: Fall Restrictions Weight Bearing Restrictions: No      Mobility  Bed Mobility               General bed mobility comments: Up in chair upon PT arrival.   Transfers Overall transfer level: Needs assistance Equipment used: Rolling walker (2 wheeled) Transfers: Sit to/from Stand Sit to Stand: Min guard         General transfer comment: Min guard for safety. Unsteady in standing without UE support.  Ambulation/Gait Ambulation/Gait assistance: Min assist Ambulation Distance (Feet): 70 Feet Assistive device: Rolling walker (2 wheeled) Gait Pattern/deviations: Step-through pattern;Decreased stride length;Trunk flexed Gait velocity: decreased Gait velocity interpretation: <1.8 ft/sec, indicative  of risk for recurrent falls General Gait Details: Slow, unsteady gait with multiple abrupt stops due to sharp pain in right side. 2/4 DOE. HR 73-94 bpm during ambulation.  Stairs            Wheelchair Mobility    Modified Rankin (Stroke Patients Only)       Balance Overall balance assessment: Needs assistance;History of Falls Sitting-balance support: Feet supported;No upper extremity supported Sitting balance-Leahy Scale: Fair     Standing balance support: During functional activity;Bilateral upper extremity supported Standing balance-Leahy Scale: Poor Standing balance comment: Reliant on BUes for support.                             Pertinent Vitals/Pain Pain Assessment: Faces Faces Pain Scale: Hurts whole lot Pain Location: right side Pain Descriptors / Indicators: Grimacing;Sharp Pain Intervention(s): Monitored during session;Limited activity within patient's tolerance    Home Living Family/patient expects to be discharged to:: Assisted living               Home Equipment: Walker - 2 wheels      Prior Function Level of Independence: Independent with assistive device(s)         Comments: Pt not a great historian but reports ambulating independent PTA however upon standing pt states, "i need something to hold onto." Previous notes state pt uses RW. Pt also states she recently moved into an ALF. Loves to paint.      Hand Dominance   Dominant Hand: Right    Extremity/Trunk Assessment   Upper Extremity Assessment: Defer to OT evaluation           Lower Extremity Assessment:  Generalized weakness         Communication   Communication: HOH  Cognition Arousal/Alertness: Awake/alert Behavior During Therapy: WFL for tasks assessed/performed Overall Cognitive Status: History of cognitive impairments - at baseline (Pt repeating things multiple times during session, "it hurts here," Explained why numerous times about her fall and broken  rib but not able to remember within session.)       Memory: Decreased short-term memory              General Comments      Exercises        Assessment/Plan    PT Assessment Patient needs continued PT services  PT Diagnosis Difficulty walking;Acute pain;Generalized weakness   PT Problem List Decreased strength;Decreased mobility;Decreased safety awareness;Decreased cognition;Decreased activity tolerance;Cardiopulmonary status limiting activity;Decreased balance;Pain  PT Treatment Interventions Therapeutic activities;Gait training;Therapeutic exercise;Patient/family education;Balance training;Functional mobility training   PT Goals (Current goals can be found in the Care Plan section) Acute Rehab PT Goals Patient Stated Goal: none stated PT Goal Formulation: With patient Time For Goal Achievement: 11/03/15 Potential to Achieve Goals: Fair    Frequency Min 3X/week   Barriers to discharge        Co-evaluation               End of Session Equipment Utilized During Treatment: Gait belt Activity Tolerance: Patient limited by pain Patient left: in chair;with call bell/phone within reach;with chair alarm set Nurse Communication: Mobility status    Functional Assessment Tool Used: clinical judgment Functional Limitation: Mobility: Walking and moving around Mobility: Walking and Moving Around Current Status [phone]): At least 20 percent but less than 40 percent impaired, limited or restricted Mobility: Walking and Moving Around Goal Status [phone]): At least 1 percent but less than 20 percent impaired, limited or restricted    Time: 0851-0912 PT Time Calculation (min) (ACUTE ONLY): 21 min   Charges:   PT Evaluation $PT Eval Moderate Complexity: 1 Procedure     PT G Codes:   PT G-Codes **NOT FOR INPATIENT CLASS** Functional Assessment Tool Used: clinical judgment Functional Limitation: Mobility: Walking and moving around Mobility: Walking and Moving Around  Current Status (N3005): At least 20 percent but less than 40 percent impaired, limited or restricted Mobility: Walking and Moving Around Goal Status [phone]): At least 1 percent but less than 20 percent impaired, limited or restricted    Dawson Hollman A Camara Renstrom 10/27/2015, 9:18 AM Mylo Red, PT, DPT [phone]

## 2015-10-27 NOTE — Clinical Social Work Note (Signed)
Signed FL2 and discharge summary faxed to ALF. Facility will contact CSW after information reviewed and patient can return. Friend at bedside will transport.  Charlynn Court, CSW [phone]

## 2015-10-27 NOTE — NC FL2 (Signed)
[place] MEDICAID FL2 LEVEL OF CARE SCREENING TOOL     IDENTIFICATION  Patient Name: Yvonne [name] Birthdate: 1918/08/27 Sex: female Admission Date (Current Location): 10/25/2015  [facility] and [place] Number:  Producer, television/film/video and Address:  [facility], [address] [place], [place] [address]      Provider Number: [id number]  Attending Physician Name and Address:  Richarda Overlie, MD  Relative Name and Phone Number:       Current Level of Care: Hospital Recommended Level of Care: Assisted Living Facility Prior Approval Number:    Date Approved/Denied:   PASRR Number:    Discharge Plan: Other (Comment) (ALF)    Current Diagnoses: Patient Active Problem List   Diagnosis Date Noted  . Acute respiratory failure (HCC) 10/25/2015  . Hypoxia 10/25/2015  . Fall 10/25/2015  . Acute on chronic diastolic heart failure (HCC) 10/25/2015  . SOB (shortness of breath)   . Lewy body dementia without behavioral disturbance   . Atrial fibrillation with RVR (HCC)   . Left knee pain   . Pneumonia   . CHF (congestive heart failure) (HCC) 05/20/2015  . Acute congestive heart failure (HCC)   . Acute respiratory failure with hypoxia (HCC) 01/24/2015  . Acute on chronic diastolic CHF (congestive heart failure), NYHA class 2 (HCC) 01/24/2015  . Hypertensive urgency 01/24/2015  . HTN (hypertension) 08/08/2014  . CKD (chronic kidney disease) stage 3, GFR 30-59 ml/min 08/08/2014  . History of CVA (cerebrovascular accident) 08/05/2014  . Frequent falls 08/05/2014  . Frequency of urination 08/05/2014  . Cognitive impairment 08/05/2014  . COPD (chronic obstructive pulmonary disease) (HCC) 08/05/2014  . Distal radius fracture, left 06/12/2014  . Wrist fracture, closed 06/12/2014  . Other specified cardiac dysrhythmias(427.89) 04/08/2013  . Hypomagnesemia 04/07/2013  . Abnormal urinalysis 04/05/2013  . Dehydration 04/05/2013  . Orthostatic hypotension  04/05/2013  . Hypokalemia 04/05/2013  . Acute CVA (cerebrovascular accident)-nonhemorrhagic 04/04/2013    Orientation RESPIRATION BLADDER Height & Weight     Self, Time, Situation, Place  O2 (Nasal Canula 2 L) Continent Weight: 148 lb 8 oz (67.4 kg) (c scale) Height:  5\' 5"  (165.1 cm)  BEHAVIORAL SYMPTOMS/MOOD NEUROLOGICAL BOWEL NUTRITION STATUS   (None)  (Lewy Body Dementia without behavioral disturbance) Continent Diet (Heart healthy)  AMBULATORY STATUS COMMUNICATION OF NEEDS Skin   Limited Assist Verbally Normal                       Personal Care Assistance Level of Assistance  Bathing, Feeding, Dressing Bathing Assistance: Limited assistance Feeding assistance: Independent Dressing Assistance: Limited assistance     Functional Limitations Info  Sight, Hearing, Speech Sight Info: Adequate Hearing Info: Impaired Speech Info: Adequate    SPECIAL CARE FACTORS FREQUENCY  Blood pressure   Physical Therapy  PT Frequency: 3 x week                  Contractures Contractures Info: Not present    Additional Factors Info  Code Status, Allergies Code Status Info: DNR Allergies Info: NKDA           Current Medications (10/27/2015):  This is the current hospital active medication list Current Facility-Administered Medications  Medication Dose Route Frequency Provider Last Rate Last Dose  . 0.9 %  sodium chloride infusion  250 mL Intravenous PRN Vassie Loll, MD      . acetaminophen (TYLENOL) tablet 650 mg  650 mg Oral Q6H PRN Vassie Loll,  MD   650 mg at 10/26/15 1704  . aspirin EC tablet 81 mg  81 mg Oral Daily Vassie Loll, MD   81 mg at 10/26/15 1000  . atorvastatin (LIPITOR) tablet 20 mg  20 mg Oral q1800 Vassie Loll, MD   20 mg at 10/26/15 1659  . calcium-vitamin D (OSCAL WITH D) 500-200 MG-UNIT per tablet 1 tablet  1 tablet Oral Q breakfast Vassie Loll, MD   1 tablet at 10/26/15 1035  . clopidogrel (PLAVIX) tablet 75 mg  75 mg Oral Q breakfast  Vassie Loll, MD   75 mg at 10/26/15 1035  . docusate sodium (COLACE) capsule 100 mg  100 mg Oral BID PRN Vassie Loll, MD   100 mg at 10/26/15 1704  . furosemide (LASIX) injection 40 mg  40 mg Intravenous Daily Pearson Grippe, MD      . heparin injection 5,000 Units  5,000 Units Subcutaneous Q8H Vassie Loll, MD   5,000 Units at 10/27/15 [phone]  . hydrALAZINE (APRESOLINE) tablet 10 mg  10 mg Oral Q8H Pearson Grippe, MD   10 mg at 10/27/15 [phone]  . ipratropium-albuterol (DUONEB) 0.5-2.5 (3) MG/3ML nebulizer solution 3 mL  3 mL Nebulization Q6H PRN Vassie Loll, MD      . levothyroxine (SYNTHROID, LEVOTHROID) tablet 25 mcg  25 mcg Oral QAC breakfast Vassie Loll, MD   25 mcg at 10/27/15 [phone]  . LORazepam (ATIVAN) tablet 0.5 mg  0.5 mg Oral Q8H PRN Vassie Loll, MD   0.5 mg at 10/25/15 2146  . metoprolol tartrate (LOPRESSOR) tablet 37.5 mg  37.5 mg Oral BID Vassie Loll, MD   37.5 mg at 10/26/15 2123  . mometasone-formoterol (DULERA) 200-5 MCG/ACT inhaler 2 puff  2 puff Inhalation BID Vassie Loll, MD   2 puff at 10/27/15 [phone]  . multivitamin with minerals tablet 1 tablet  1 tablet Oral Daily Vassie Loll, MD   1 tablet at 10/26/15 1036  . ondansetron (ZOFRAN) injection 4 mg  4 mg Intravenous Q6H PRN Vassie Loll, MD      . polyethylene glycol (MIRALAX / GLYCOLAX) packet 17 g  17 g Oral Daily PRN Vassie Loll, MD      . potassium chloride SA (K-DUR,KLOR-CON) CR tablet 20 mEq  20 mEq Oral Q1200 Vassie Loll, MD   20 mEq at 10/26/15 1035  . sertraline (ZOLOFT) tablet 50 mg  50 mg Oral Daily Vassie Loll, MD   50 mg at 10/26/15 1000  . sodium chloride flush (NS) 0.9 % injection 3 mL  3 mL Intravenous Q12H Vassie Loll, MD   3 mL at 10/26/15 2124  . sodium chloride flush (NS) 0.9 % injection 3 mL  3 mL Intravenous PRN Vassie Loll, MD         Discharge Medications: Please see discharge summary for a list of discharge medications.  Relevant Imaging Results:  Relevant Lab Results:   Additional  Information SS#: [id number]  Margarito Liner, LCSW

## 2015-10-27 NOTE — Clinical Social Work Note (Signed)
Clinical Social Work Assessment  Patient Details  Name: Yvonne [name] MRN: [id number] Date of Birth: 01-27-1919  Date of referral:  10/26/15               Reason for consult:  Discharge Planning                Permission sought to share information with:  Customer service manager, Other Permission granted to share information::  Yes, Verbal Permission Granted  Name::        Agency::  [facility]  Relationship::     Contact Information:     Housing/Transportation Living arrangements for the past 2 months:  [place] of Information:  Patient, Medical Team, Other (Comment Required) (Friend) Patient Interpreter Needed:  None Criminal Activity/Legal Involvement Pertinent to Current Situation/Hospitalization:  No - Comment as needed Significant Relationships:  Friend Lives with:  Facility Resident Do you feel safe going back to the place where you live?  Yes Need for family participation in patient care:  Yes (Comment)  Care giving concerns:  Patient is from [facility].   Social Worker assessment / plan:  CSW met with patient. Friend at bedside. CSW introduced role and explained that discharge planning would be discussed. Patient hard of hearing so friend answered questions and confirmed that patient is from ALF and the plan is to return. Patient's friend will transport. CSW explained that FL2 and discharge summary need to be faxed over and reviewed before patient can transport back to facility. No further concerns. CSW encouraged patient and friend to contact CSW as needed. CSW will continue to follow patient for support and facilitate discharge back to ALF today.  Employment status:  Retired Nurse, adult PT Recommendations:  Not assessed at this time Information / Referral to community resources:  Other (Comment Required) (Plan is to return to [facility])  Patient/Family's Response to care:  Patient and  friend agreeable to return to ALF. Patient's friends supportive and involved in patient's care. Patient and friend appreciated social work intervention.  Patient/Family's Understanding of and Emotional Response to Diagnosis, Current Treatment, and Prognosis:  Patient understands that she will be discharging back to ALF. She appears happy with hospital care.  Emotional Assessment Appearance:  Appears stated age Attitude/Demeanor/Rapport:  Other (Pleasant) Affect (typically observed):  Accepting, Appropriate, Calm, Pleasant Orientation:  Oriented to Self, Oriented to Place, Oriented to  Time, Oriented to Situation Alcohol / Substance use:  Never Used Psych involvement (Current and /or in the community):  No (Comment)  Discharge Needs  Concerns to be addressed:    Readmission within the last 30 days:  No Current discharge risk:  None Barriers to Discharge:  No Barriers Identified   Candie Chroman, LCSW 10/27/2015, 10:35 AM

## 2015-10-27 NOTE — Care Management Note (Signed)
Case Management Note  Patient Details  Name: Yvonne [name] MRN: [id number] Date of Birth: 08/01/1918  Subjective/Objective:            Admitted with CHF        Action/Plan: Patient is a resident of [facility] and plans to return there at discharge. Soc Worker to see.  Expected Discharge Date:    10/27/2015              Expected Discharge Plan:  Long Term Acute Care [facility])  Discharge planning Services  CM Consult   Choice offered to:  NA Status of Service:  Completed, signed off  Reola Mosher [phone] 10/27/2015, 9:53 AM

## 2015-10-27 NOTE — Progress Notes (Signed)
Patient is discharged from room 3E at this time. Alert and in stable condition. IV site d/c'd as well as tele. Instructions read to patient with understanding verbalized. Left unit via wheelchair with niece and all belongings at side.

## 2015-10-27 NOTE — Discharge Summary (Signed)
Yvonne [name], is a 82 y.o. female  DOB 12/27/1918  MRN [id number].  Admission date:  10/25/2015  Admitting Physician  No admitting provider for patient encounter.  Discharge Date:  10/27/2015   Primary MD  Gaye Alken, MD  Recommendations for primary care physician for things to follow:   CHF (EF 65%), diastolic Please adhere to low sodium diet Check daily weight, please call physician if gains.4lbs.  Pcp to please ensure that she makes an appointment to see Andreas Ohm  Moderate MR (echo 10/26/2015) Please follow up with cardiology  Pafib (chadsvasc 2=5) ? Not a candidate for anticoagulation due to ? Fall risk Cont metoprolol, aspirin, plavix  Renal insufficiency /ckd stage3 Please check cmp Baseline creatinine appears to be about 1.3- 1.6  Hypertension Controlled Cont current bp medications  Hx of CVA Cont plavix      Admission Diagnosis  Renal insufficiency [N28.9] Acute respiratory failure with hypoxia (HCC) [J96.01] Acute on chronic congestive heart failure, unspecified congestive heart failure type (HCC) [I50.9]   Discharge Diagnosis  Renal insufficiency [N28.9] Acute respiratory failure with hypoxia (HCC) [J96.01] Acute on chronic congestive heart failure, unspecified congestive heart failure type (HCC) [I50.9]    Principal Problem:   Acute on chronic diastolic CHF (congestive heart failure), NYHA class 2 (HCC) Active Problems:   History of CVA (cerebrovascular accident)   HTN (hypertension)   CKD (chronic kidney disease) stage 3, GFR 30-59 ml/min   Hypoxia   Fall   Acute on chronic diastolic heart failure (HCC)   SOB (shortness of breath)   Lewy body dementia without behavioral disturbance      Past Medical History:  Diagnosis Date  . ARF (acute renal failure) (HCC)   . CHF (congestive heart failure) (HCC)   . Hypercholesteremia   .  Hypertension   . Paroxysmal atrial fibrillation (HCC)   . TIA (transient ischemic attack)     Past Surgical History:  Procedure Laterality Date  . BACK SURGERY    . ORIF WRIST FRACTURE Left 06/12/2014  . ORIF WRIST FRACTURE Left 06/12/2014   Procedure: OPEN REDUCTION INTERNAL FIXATION (ORIF) WRIST FRACTURE;  Surgeon: Bradly Bienenstock, MD;  Location: [facility] OR;  Service: Orthopedics;  Laterality: Left;       HPI  from the history and physical done on the day of admission:     82 y.o. female with PMH significant for diastolic heart failure, Atrial fibrillation (not on anticoagulation), hx of TIA/stroke, dementia, hypothyroidism, anxiety/depression; who presented from ALF for further evaluation after she fall today. Patient reports tripping while trying to get out of bed, but unable to specify which part of her body she injured. She endorses pain in her right costal area and right knee mainly. Patient denies HA's, blurred vision, LOC, nausea, vomiting, fever, abd pain, dysuria, diarrhea, coughing or any other complaints.  While in ED she was found to be hypoxic (O2 sat in mid 80's on RA); a CT scan showed interstitial edema and she was found to have  elevated BNP.  ED Course: patient received 1 dose of IV lasix (40mg ) and [facility] called for admission due to acute on chronic CHF.    Hospital Course:      Pt admitted for CHF (diastolic), pt diuresed with iv lasix. Trop negative.  Pt feels clinically better back to baseline.  Pt transitioned to po lasix.  Creatinine remained stable at approximately 1.5-1.6  Pt apparently not a candidate for anticoagulation  ? Fall risk.  Pt had cardiac echo on 10/26/2015=> EF 65%, and moderate MR.  Pt will need to follow up with cardiology regarding her CHF/ MR   Follow UP  Follow-up Information    Gaye Alken, MD Follow up in 1 week(s).   Specialty:  Family Medicine Contact information: [address] [place] [address] [phone]          Tobias Alexander, MD Follow up in 1 week(s).   Specialty:  Cardiology Contact information: [address] [place] [address] [phone]            Consults obtained - none  Discharge Condition: stable  Diet and Activity recommendation: See Discharge Instructions below  Discharge Instructions         Discharge Medications       Medication List    STOP taking these medications   ondansetron 4 MG tablet Commonly known as:  ZOFRAN     TAKE these medications   acetaminophen 325 MG tablet Commonly known as:  TYLENOL Take 2 tablets (650 mg total) by mouth every 6 (six) hours as needed for fever, headache, mild pain or moderate pain. What changed:  when to take this   albuterol 108 (90 Base) MCG/ACT inhaler Commonly known as:  PROVENTIL HFA;VENTOLIN HFA Inhale 2 puffs into the lungs every 6 (six) hours as needed for wheezing or shortness of breath.   aspirin 81 MG EC tablet Take 1 tablet (81 mg total) by mouth daily.   atorvastatin 20 MG tablet Commonly known as:  LIPITOR Take 1 tablet (20 mg total) by mouth daily at 6 PM.   calcium-vitamin D 500-200 MG-UNIT tablet Commonly known as:  OSCAL WITH D Take 1 tablet by mouth daily with breakfast.   clopidogrel 75 MG tablet Commonly known as:  PLAVIX Take 1 tablet (75 mg total) by mouth daily with breakfast.   docusate sodium 100 MG capsule Commonly known as:  COLACE Take 1 capsule (100 mg total) by mouth 2 (two) times daily as needed for mild constipation.   Fluticasone-Salmeterol 250-50 MCG/DOSE Aepb Commonly known as:  ADVAIR DISKUS Inhale 1 puff into the lungs 2 (two) times daily.   furosemide 20 MG tablet Commonly known as:  LASIX Take 2 tablets (40 mg total) by mouth daily. Take 40mg  in am and 20mg  in pm What changed:  how much to take  when to take this  additional instructions   ipratropium-albuterol 0.5-2.5 (3) MG/3ML Soln Commonly known as:  DUONEB Take 3 mLs by  nebulization every 4 (four) hours as needed. What changed:  reasons to take this   levothyroxine 25 MCG tablet Commonly known as:  SYNTHROID, LEVOTHROID Take 25 mcg by mouth daily at 12 noon.   LORazepam 0.5 MG tablet Commonly known as:  ATIVAN Take 0.5 mg by mouth every 6 (six) hours as needed for anxiety.   magnesium hydroxide 400 MG/5ML suspension Commonly known as:  MILK OF MAGNESIA Take 30 mLs by mouth daily as needed for mild constipation.   metoprolol  tartrate 25 MG tablet Commonly known as:  LOPRESSOR Take 37.5 mg by mouth 2 (two) times daily.   multivitamins ther. w/minerals Tabs tablet Take 1 tablet by mouth daily.   polyethylene glycol packet Commonly known as:  MIRALAX / GLYCOLAX Take 17 g by mouth daily as needed. What changed:  reasons to take this   potassium chloride SA 20 MEQ tablet Commonly known as:  K-DUR,KLOR-CON Take 20 mEq by mouth daily at 12 noon.   sertraline 50 MG tablet Commonly known as:  ZOLOFT Take 50 mg by mouth daily.       Major procedures and Radiology Reports - PLEASE review detailed and final reports for all details, in brief -      Dg Chest 2 View  Result Date: 10/26/2015 CLINICAL DATA:  Acute shortness of breath today. EXAM: CHEST  2 VIEW COMPARISON:  10/25/2015 and prior exam FINDINGS: Cardiomegaly identified. Mild interstitial opacities are now noted and question mild interstitial edema. Mild bibasilar atelectasis again noted. There is no evidence of pneumothorax. No other changes identified. IMPRESSION: Cardiomegaly with mild interstitial pulmonary edema. Electronically Signed   By: Harmon Pier M.D.   On: 10/26/2015 07:31  Ct Chest Wo Contrast  Addendum Date: 10/25/2015   ADDENDUM REPORT: 10/25/2015 18:21 ADDENDUM: A 3 cm possible mass within the upper-outer right breast is identified. Recommend elective diagnostic mammogram and ultrasound for further evaluation, as clinically indicated. Electronically Signed   By: Harmon Pier  M.D.   On: 10/25/2015 18:21  Result Date: 10/25/2015 CLINICAL DATA:  82 year old female with recent fall and right chest and rib pain. Hypoxia. EXAM: CT CHEST WITHOUT CONTRAST TECHNIQUE: Multidetector CT imaging of the chest was performed following the standard protocol without IV contrast. COMPARISON:  10/25/2015 and prior chest radiographs FINDINGS: Cardiovascular: Cardiomegaly and heavy coronary artery calcifications noted. Thoracic aortic atherosclerotic calcifications noted without aneurysm. Mediastinum/Nodes: No mediastinal mass identified. Upper limits of normal bilateral mediastinal lymph nodes are nonspecific. No pericardial effusion. Lungs/Pleura: Interstitial thickening bilaterally identified and may represent edema. Dependent and bibasilar atelectasis identified. Diffuse peribronchial thickening noted. There is no evidence of pulmonary mass, pleural effusion or pneumothorax. Upper Abdomen: No acute abnormality. Aortic atherosclerotic calcifications noted. Musculoskeletal: No acute or suspicious abnormalities. Multiple remote right-sided rib fractures noted as well as a severe compression fracture of T7. IMPRESSION: Bilateral interstitial opacities likely representing pulmonary edema. Dependent and bibasilar atelectasis. No evidence of acute bony abnormality. Remote right rib and T7 fractures. Cardiomegaly, coronary disease and thoracic aortic atherosclerosis. Abdominal aortic atherosclerosis. Electronically Signed: By: Harmon Pier M.D. On: 10/25/2015 15:04  Dg Chest Portable 1 View  Result Date: 10/25/2015 CLINICAL DATA:  Pain after fall. EXAM: PORTABLE CHEST 1 VIEW COMPARISON:  May 22, 2015 FINDINGS: Right-sided rib fractures are again seen, unchanged in the interval. No pneumothorax. Stable cardiomegaly. No pulmonary nodules, masses, or focal infiltrates. No other changes. IMPRESSION: No active disease. Electronically Signed   By: Gerome Sam III M.D   On: 10/25/2015 11:05   Micro  Results     Recent Results (from the past 240 hour(s))  MRSA PCR Screening     Status: None   Collection Time: 10/26/15 10:48 AM  Result Value Ref Range Status   MRSA by PCR NEGATIVE NEGATIVE Final    Comment:        The GeneXpert MRSA Assay (FDA approved for NASAL specimens only), is one component of a comprehensive MRSA colonization surveillance program. It is not intended to diagnose MRSA infection nor to  guide or monitor treatment for MRSA infections.        Today   Subjective    [place] today has no headache,no chest abdominal pain,no new weakness tingling or numbness, feels much better wants to go home today.    Objective   Blood pressure (!) 104/55, pulse 74, temperature 98.9 F (37.2 C), temperature source Oral, resp. rate 18, height  (1.651 m), weight 67.4 kg (148 lb 8 oz), SpO2 94 %.   Intake/Output Summary (Last 24 hours) at 10/27/15 0649 Last data filed at 10/27/15 0311  Gross per 24 hour  Intake              240 ml  Output             1050 ml  Net             -810 ml    Exam Awake Alert, Oriented x 3, No new F.N deficits, Normal affect [place].AT,PERRAL Supple Neck,No JVD, No cervical lymphadenopathy appriciated.  Symmetrical Chest wall movement, Good air movement bilaterally, CTAB Irr, irr, s1, s2, 1/6 sem apex, No Parasternal Heave +ve B.Sounds, Abd Soft, Non tender, No organomegaly appriciated, No rebound -guarding or rigidity. No Cyanosis, Clubbing or edema, No new Rash or bruise   Data Review   CBC w Diff:  Lab Results  Component Value Date   WBC 6.9 10/27/2015   HGB 13.2 10/27/2015   HCT 42.5 10/27/2015   PLT 163 10/27/2015   LYMPHOPCT 9 10/25/2015   MONOPCT 9 10/25/2015   EOSPCT 2 10/25/2015   BASOPCT 0 10/25/2015    CMP:  Lab Results  Component Value Date   NA 138 10/27/2015   K 4.7 10/27/2015   CL 98 (L) 10/27/2015   CO2 31 10/27/2015   BUN 49 (H) 10/27/2015   CREATININE 1.58 (H) 10/27/2015   PROT 6.7  10/27/2015   ALBUMIN 3.6 10/27/2015   BILITOT 1.4 (H) 10/27/2015   ALKPHOS 38 10/27/2015   AST 33 10/27/2015   ALT 24 10/27/2015  .   Total Time in preparing paper work, data evaluation and todays exam - 35 minutes  Pearson Grippe M.D on 10/27/2015 at 6:49 AM  [facility]   Office  [phone]

## 2015-10-27 NOTE — Discharge Instructions (Signed)
Please follow up with your pcp in 1 week.  Please call Dr. Kae Heller office to make an appointment with cardiology (seen on prior hospitalization in February by her)

## 2015-10-27 NOTE — Care Management Obs Status (Signed)
MEDICARE OBSERVATION STATUS NOTIFICATION   Patient Details  Name: Yvonne [name] MRN: [id number] Date of Birth: 13-Mar-1919   Medicare Observation Status Notification Given:  Yes    Cherrie Distance, RN 10/27/2015, 10:16 AM

## 2015-11-12 ENCOUNTER — Ambulatory Visit: Payer: Medicare Other | Admitting: Cardiology

## 2016-02-21 ENCOUNTER — Emergency Department (HOSPITAL_COMMUNITY)
Admission: EM | Admit: 2016-02-21 | Discharge: 2016-02-21 | Disposition: A | Discharge status: Home / Self Care | Payer: Medicare Other | Attending: Emergency Medicine | Admitting: Emergency Medicine

## 2016-02-21 ENCOUNTER — Encounter (HOSPITAL_COMMUNITY): Payer: Self-pay | Admitting: Emergency Medicine

## 2016-02-21 DIAGNOSIS — N289 Disorder of kidney and ureter, unspecified: Secondary | ICD-10-CM | POA: Diagnosis not present

## 2016-02-21 DIAGNOSIS — I130 Hypertensive heart and chronic kidney disease with heart failure and stage 1 through stage 4 chronic kidney disease, or unspecified chronic kidney disease: Secondary | ICD-10-CM | POA: Insufficient documentation

## 2016-02-21 DIAGNOSIS — Z7982 Long term (current) use of aspirin: Secondary | ICD-10-CM | POA: Diagnosis not present

## 2016-02-21 DIAGNOSIS — I5033 Acute on chronic diastolic (congestive) heart failure: Secondary | ICD-10-CM | POA: Insufficient documentation

## 2016-02-21 DIAGNOSIS — J449 Chronic obstructive pulmonary disease, unspecified: Secondary | ICD-10-CM | POA: Insufficient documentation

## 2016-02-21 DIAGNOSIS — R5383 Other fatigue: Secondary | ICD-10-CM | POA: Insufficient documentation

## 2016-02-21 DIAGNOSIS — N183 Chronic kidney disease, stage 3 (moderate): Secondary | ICD-10-CM | POA: Diagnosis not present

## 2016-02-21 DIAGNOSIS — Z87891 Personal history of nicotine dependence: Secondary | ICD-10-CM | POA: Insufficient documentation

## 2016-02-21 DIAGNOSIS — R109 Unspecified abdominal pain: Secondary | ICD-10-CM | POA: Diagnosis present

## 2016-02-21 DIAGNOSIS — Z8673 Personal history of transient ischemic attack (TIA), and cerebral infarction without residual deficits: Secondary | ICD-10-CM | POA: Insufficient documentation

## 2016-02-21 DIAGNOSIS — Z79899 Other long term (current) drug therapy: Secondary | ICD-10-CM | POA: Insufficient documentation

## 2016-02-21 LAB — URINALYSIS, ROUTINE W REFLEX MICROSCOPIC
BILIRUBIN URINE: NEGATIVE
Glucose, UA: NEGATIVE mg/dL
HGB URINE DIPSTICK: NEGATIVE
Ketones, ur: NEGATIVE mg/dL
NITRITE: NEGATIVE
PH: 6 (ref 5.0–8.0)
Protein, ur: NEGATIVE mg/dL
SPECIFIC GRAVITY, URINE: 1.016 (ref 1.005–1.030)

## 2016-02-21 LAB — COMPREHENSIVE METABOLIC PANEL
ALBUMIN: 3.9 g/dL (ref 3.5–5.0)
ALK PHOS: 61 U/L (ref 38–126)
ALT: 14 U/L (ref 14–54)
ANION GAP: 11 (ref 5–15)
AST: 18 U/L (ref 15–41)
BILIRUBIN TOTAL: 1.6 mg/dL — AB (ref 0.3–1.2)
BUN: 34 mg/dL — ABNORMAL HIGH (ref 6–20)
CALCIUM: 8.7 mg/dL — AB (ref 8.9–10.3)
CO2: 26 mmol/L (ref 22–32)
CREATININE: 1.23 mg/dL — AB (ref 0.44–1.00)
Chloride: 104 mmol/L (ref 101–111)
GFR calc Af Amer: 42 mL/min — ABNORMAL LOW (ref >60)
GFR calc non Af Amer: 36 mL/min — ABNORMAL LOW (ref >60)
GLUCOSE: 130 mg/dL — AB (ref 65–99)
Potassium: 3.8 mmol/L (ref 3.5–5.1)
Sodium: 141 mmol/L (ref 135–145)
TOTAL PROTEIN: 7.2 g/dL (ref 6.5–8.1)

## 2016-02-21 LAB — CBC WITH DIFFERENTIAL/PLATELET
BASOS ABS: 0 10*3/uL (ref 0.0–0.1)
BASOS PCT: 0 %
EOS ABS: 0.1 10*3/uL (ref 0.0–0.7)
EOS PCT: 2 %
HCT: 42.4 % (ref 36.0–46.0)
Hemoglobin: 13.2 g/dL (ref 12.0–15.0)
Lymphocytes Relative: 12 %
Lymphs Abs: 0.8 10*3/uL (ref 0.7–4.0)
MCH: 30.6 pg (ref 26.0–34.0)
MCHC: 31.1 g/dL (ref 30.0–36.0)
MCV: 98.4 fL (ref 78.0–100.0)
MONO ABS: 0.6 10*3/uL (ref 0.1–1.0)
Monocytes Relative: 8 %
NEUTROS ABS: 5.4 10*3/uL (ref 1.7–7.7)
Neutrophils Relative %: 78 %
PLATELETS: 164 10*3/uL (ref 150–400)
RBC: 4.31 MIL/uL (ref 3.87–5.11)
RDW: 15.4 % (ref 11.5–15.5)
WBC: 7 10*3/uL (ref 4.0–10.5)

## 2016-02-21 LAB — URINE MICROSCOPIC-ADD ON: RBC / HPF: NONE SEEN RBC/hpf (ref 0–5)

## 2016-02-21 NOTE — ED Notes (Signed)
Bed: [id number] Expected date:  Expected time:  Means of arrival:  Comments: 82 yo F/ Flank pain

## 2016-02-21 NOTE — ED Provider Notes (Signed)
[facility]-EMERGENCY DEPT Provider Note   CSN: [id number] Arrival date & time: 02/21/16  0117 By signing my name below, I, [name], attest that this documentation has been prepared under the direction and in the presence of Dione Boozeavid Jasmin Winberry, MD. Electronically Signed: [name], ED Scribe. 02/21/16. 1:57 AM.  History   Chief Complaint Chief Complaint  Patient presents with  . Flank Pain   LEVEL 5 CAVEAT: HPI and ROS limited due to dementia.  HPI Comments: Yvonne DrossVirginia K [name] is a 82 y.o. female with h/o ARF, CHF, COPD, HTN, A-fib, dementia, and TIA who presents to the Emergency Department by EMS from [facility] complaining of altered mental status onset one day ago. Per facility staff, pt was lethargic and had some difficulty ambulating all day. Daughter reports that pt has been complaining of back pain, noting that it may be relating to her kidneys. Facility staff noted that pt had just finished an antibiotic for a UTI she recently had an increase in urination. Pt is regularly ambulatory with a walker. Pt denies fever.  The history is provided by the patient, the EMS personnel and the nursing home. The history is limited by the condition of the patient. No language interpreter was used.    Past Medical History:  Diagnosis Date  . ARF (acute renal failure) (HCC)   . CHF (congestive heart failure) (HCC)   . Hypercholesteremia   . Hypertension   . Paroxysmal atrial fibrillation (HCC)   . TIA (transient ischemic attack)     Patient Active Problem List   Diagnosis Date Noted  . Acute respiratory failure (HCC) 10/25/2015  . Hypoxia 10/25/2015  . Fall 10/25/2015  . Acute on chronic diastolic heart failure (HCC) 10/25/2015  . SOB (shortness of breath)   . Lewy body dementia without behavioral disturbance   . Atrial fibrillation with RVR (HCC)   . Left knee pain   . Pneumonia   . CHF (congestive heart failure) (HCC) 05/20/2015  . Acute congestive heart failure (HCC)   . Acute respiratory  failure with hypoxia (HCC) 01/24/2015  . Acute on chronic diastolic CHF (congestive heart failure), NYHA class 2 (HCC) 01/24/2015  . Hypertensive urgency 01/24/2015  . HTN (hypertension) 08/08/2014  . CKD (chronic kidney disease) stage 3, GFR 30-59 ml/min 08/08/2014  . History of CVA (cerebrovascular accident) 08/05/2014  . Frequent falls 08/05/2014  . Frequency of urination 08/05/2014  . Cognitive impairment 08/05/2014  . COPD (chronic obstructive pulmonary disease) (HCC) 08/05/2014  . Distal radius fracture, left 06/12/2014  . Wrist fracture, closed 06/12/2014  . Other specified cardiac dysrhythmias(427.89) 04/08/2013  . Hypomagnesemia 04/07/2013  . Abnormal urinalysis 04/05/2013  . Dehydration 04/05/2013  . Orthostatic hypotension 04/05/2013  . Hypokalemia 04/05/2013  . Acute CVA (cerebrovascular accident)-nonhemorrhagic 04/04/2013    Past Surgical History:  Procedure Laterality Date  . BACK SURGERY    . ORIF WRIST FRACTURE Left 06/12/2014  . ORIF WRIST FRACTURE Left 06/12/2014   Procedure: OPEN REDUCTION INTERNAL FIXATION (ORIF) WRIST FRACTURE;  Surgeon: Bradly BienenstockFred Ortmann, MD;  Location: [facility] OR;  Service: Orthopedics;  Laterality: Left;    OB History    No data available       Home Medications    Prior to Admission medications   Medication Sig Start Date End Date Taking? Authorizing Provider  acetaminophen (TYLENOL) 325 MG tablet Take 2 tablets (650 mg total) by mouth every 6 (six) hours as needed for fever, headache, mild pain or moderate pain. Patient taking differently: Take 650 mg  by mouth 2 (two) times daily as needed for mild pain, moderate pain, fever or headache.  06/14/14   Nonie Hoyer, PA-C  albuterol (PROVENTIL HFA;VENTOLIN HFA) 108 (90 BASE) MCG/ACT inhaler Inhale 2 puffs into the lungs every 6 (six) hours as needed for wheezing or shortness of breath. 04/09/13   Catarina Hartshorn, MD  aspirin EC 81 MG EC tablet Take 1 tablet (81 mg total) by mouth daily. 10/27/15   Pearson Grippe, MD  atorvastatin (LIPITOR) 20 MG tablet Take 1 tablet (20 mg total) by mouth daily at 6 PM. Patient not taking: Reported on 10/25/2015 08/07/14   Belkys A Regalado, MD  calcium-vitamin D (OSCAL WITH D) 500-200 MG-UNIT tablet Take 1 tablet by mouth daily with breakfast.    Historical Provider, MD  clopidogrel (PLAVIX) 75 MG tablet Take 1 tablet (75 mg total) by mouth daily with breakfast. 04/09/13   Catarina Hartshorn, MD  docusate sodium (COLACE) 100 MG capsule Take 1 capsule (100 mg total) by mouth 2 (two) times daily as needed for mild constipation. 06/14/14   Nonie Hoyer, PA-C  Fluticasone-Salmeterol (ADVAIR DISKUS) 250-50 MCG/DOSE AEPB Inhale 1 puff into the lungs 2 (two) times daily. 08/30/14   Ripudeep Jenna Luo, MD  furosemide (LASIX) 20 MG tablet Take 2 tablets (40 mg total) by mouth daily. Take 40mg  in am and 20mg  in pm Patient taking differently: Take 20-40 mg by mouth 2 (two) times daily. Take 40mg  in am and 20mg  in pm 05/26/15   Zannie Cove, MD  ipratropium-albuterol (DUONEB) 0.5-2.5 (3) MG/3ML SOLN Take 3 mLs by nebulization every 4 (four) hours as needed. Patient taking differently: Take 3 mLs by nebulization every 4 (four) hours as needed (shortness of breath).  01/27/15   Dorothea Ogle, MD  levothyroxine (SYNTHROID, LEVOTHROID) 25 MCG tablet Take 25 mcg by mouth daily at 12 noon. 05/14/15   Historical Provider, MD  LORazepam (ATIVAN) 0.5 MG tablet Take 0.5 mg by mouth every 6 (six) hours as needed for anxiety.    Historical Provider, MD  magnesium hydroxide (MILK OF MAGNESIA) 400 MG/5ML suspension Take 30 mLs by mouth daily as needed for mild constipation.    Historical Provider, MD  metoprolol tartrate (LOPRESSOR) 25 MG tablet Take 37.5 mg by mouth 2 (two) times daily. 04/30/15   Historical Provider, MD  Multiple Vitamins-Minerals (MULTIVITAMINS THER. W/MINERALS) TABS tablet Take 1 tablet by mouth daily.    Historical Provider, MD  polyethylene glycol (MIRALAX / GLYCOLAX) packet Take 17 g by  mouth daily as needed. Patient taking differently: Take 17 g by mouth daily as needed for mild constipation.  06/14/14   Nonie Hoyer, PA-C  potassium chloride SA (K-DUR,KLOR-CON) 20 MEQ tablet Take 20 mEq by mouth daily at 12 noon. 04/24/15   Historical Provider, MD  sertraline (ZOLOFT) 50 MG tablet Take 50 mg by mouth daily.     Historical Provider, MD    Family History Family History  Problem Relation Age of Onset  . Hypertension Mother   . Hypertension Father     Social History Social History  Substance Use Topics  . Smoking status: Former Smoker    Quit date: 04/05/1983  . Smokeless tobacco: Never Used  . Alcohol use No     Allergies   Patient has no known allergies.   Review of Systems Review of Systems  Unable to perform ROS: Dementia   Physical Exam Updated Vital Signs BP 158/93 (BP Location: Left Arm) Comment: Simultaneous filing. User  may not have seen previous data.  Pulse 111 Comment: Simultaneous filing. User may not have seen previous data.  Temp 98.6 F (37 C) (Oral)   Resp 22   Ht 5\' 6"  (1.676 m)   Wt 150 lb (68 kg)   SpO2 (!) 87% Comment: Simultaneous filing. User may not have seen previous data.  BMI 24.21 kg/m   Physical Exam  Constitutional: She appears well-developed and well-nourished.  HENT:  Head: Normocephalic and atraumatic.  Eyes: EOM are normal. Pupils are equal, round, and reactive to light.  Neck: Normal range of motion. Neck supple. No JVD present.  Cardiovascular: Normal rate, regular rhythm and normal heart sounds.   No murmur heard. Pulmonary/Chest: Effort normal and breath sounds normal. She has no wheezes. She has no rales. She exhibits no tenderness.  Abdominal: Soft. Bowel sounds are normal. She exhibits no distension and no mass. There is no tenderness.  Musculoskeletal: Normal range of motion. She exhibits no edema.  Lymphadenopathy:    She has no cervical adenopathy.  Neurological: She is alert. No cranial nerve deficit.  She exhibits normal muscle tone. Coordination normal.  Oriented to person and place but not time.  Skin: Skin is warm and dry. No rash noted.  Psychiatric: She has a normal mood and affect. Her behavior is normal. Judgment and thought content normal.  Nursing note and vitals reviewed.    ED Treatments / Results  DIAGNOSTIC STUDIES: Oxygen Saturation is 87% on RA, poor by my interpretation.    COORDINATION OF CARE: 1:52 AM Discussed treatment plan with pt at bedside which includes urinalysis and pt agreed to plan.  Labs (all labs ordered are listed, but only abnormal results are displayed) Labs Reviewed  URINALYSIS, ROUTINE W REFLEX MICROSCOPIC (NOT AT [facility]) - Abnormal; Notable for the following:       Result Value   Leukocytes, UA TRACE (*)    All other components within normal limits  COMPREHENSIVE METABOLIC PANEL - Abnormal; Notable for the following:    Glucose, Bld 130 (*)    BUN 34 (*)    Creatinine, Ser 1.23 (*)    Calcium 8.7 (*)    Total Bilirubin 1.6 (*)    GFR calc non Af Amer 36 (*)    GFR calc Af Amer 42 (*)    All other components within normal limits  URINE MICROSCOPIC-ADD ON - Abnormal; Notable for the following:    Squamous Epithelial / LPF 0-5 (*)    Bacteria, UA FEW (*)    Casts HYALINE CASTS (*)    All other components within normal limits  CBC WITH DIFFERENTIAL/PLATELET   Procedures Procedures (including critical care time)  Medications Ordered in ED Medications - No data to display   Initial Impression / Assessment and Plan / ED Course  I have reviewed the triage vital signs and the nursing notes.  Pertinent labs & imaging results that were available during my care of the patient were reviewed by me and considered in my medical decision making (see chart for details).  Clinical Course    Report of altered mental status with lethargy and inability to ambulate. Patient is awake and alert and that not at all lethargic on my evaluation. Old records  are reviewed, and she has no relevant past visits. There was some concern about possible urinary tract infection. Screening labs are obtained and she is noted to have mild renal insufficiency which is essentially unchanged from baseline-actually somewhat improved. No evidence of urinary tract  infection. She was ambulated in the ED without difficulty. She is discharged to return to her assisted living facility.  Final Clinical Impressions(s) / ED Diagnoses   Final diagnoses:  Lethargy  Renal insufficiency    New Prescriptions New Prescriptions   No medications on file   I personally performed the services described in this documentation, which was scribed in my presence. The recorded information has been reviewed and is accurate.       Dione Booze, MD 02/21/16 [phone]

## 2016-02-21 NOTE — ED Notes (Signed)
Patient is alert and oriented x3.  She was given DC instructions and follow up visit instructions.  Patient gave verbal understanding. She was DC ambulatory under her own power to home.  V/S stable.  He was not showing any signs of distress on DC 

## 2016-02-21 NOTE — Discharge Instructions (Signed)
Return if any problems.

## 2016-02-21 NOTE — ED Triage Notes (Signed)
Per EMS pt is [place] [facility] [place]. Complaints of sudden left flank pain. Pain mostly present when moving. Recently finished atibiotic for UTI, and increase in urination. Pt has dementia, is at baseline. 170/112,hr 116, 92%RA. Hx of COPD but does not wear O2. Lungs clear bilaterally per EMS. Pt has DNR order present.

## 2016-03-29 ENCOUNTER — Emergency Department (HOSPITAL_COMMUNITY)
Admission: EM | Admit: 2016-03-29 | Discharge: 2016-03-29 | Disposition: A | Discharge status: Home / Self Care | Payer: Medicare Other | Attending: Emergency Medicine | Admitting: Emergency Medicine

## 2016-03-29 ENCOUNTER — Encounter (HOSPITAL_COMMUNITY): Payer: Self-pay | Admitting: Emergency Medicine

## 2016-03-29 DIAGNOSIS — Z8673 Personal history of transient ischemic attack (TIA), and cerebral infarction without residual deficits: Secondary | ICD-10-CM | POA: Insufficient documentation

## 2016-03-29 DIAGNOSIS — N183 Chronic kidney disease, stage 3 (moderate): Secondary | ICD-10-CM | POA: Insufficient documentation

## 2016-03-29 DIAGNOSIS — J449 Chronic obstructive pulmonary disease, unspecified: Secondary | ICD-10-CM | POA: Insufficient documentation

## 2016-03-29 DIAGNOSIS — Y939 Activity, unspecified: Secondary | ICD-10-CM | POA: Diagnosis not present

## 2016-03-29 DIAGNOSIS — Z87891 Personal history of nicotine dependence: Secondary | ICD-10-CM | POA: Insufficient documentation

## 2016-03-29 DIAGNOSIS — I5033 Acute on chronic diastolic (congestive) heart failure: Secondary | ICD-10-CM | POA: Diagnosis not present

## 2016-03-29 DIAGNOSIS — Z79899 Other long term (current) drug therapy: Secondary | ICD-10-CM | POA: Insufficient documentation

## 2016-03-29 DIAGNOSIS — S0993XA Unspecified injury of face, initial encounter: Secondary | ICD-10-CM | POA: Diagnosis present

## 2016-03-29 DIAGNOSIS — Y999 Unspecified external cause status: Secondary | ICD-10-CM | POA: Diagnosis not present

## 2016-03-29 DIAGNOSIS — Y929 Unspecified place or not applicable: Secondary | ICD-10-CM | POA: Insufficient documentation

## 2016-03-29 DIAGNOSIS — X58XXXA Exposure to other specified factors, initial encounter: Secondary | ICD-10-CM | POA: Insufficient documentation

## 2016-03-29 DIAGNOSIS — I130 Hypertensive heart and chronic kidney disease with heart failure and stage 1 through stage 4 chronic kidney disease, or unspecified chronic kidney disease: Secondary | ICD-10-CM | POA: Insufficient documentation

## 2016-03-29 NOTE — Discharge Instructions (Signed)
Read the information below.  You may return to the Emergency Department at any time for worsening condition or any new symptoms that concern you.   If you develop pain or swelling in your mouth or under your tongue, develop fevers or difficulty swallowing or breathing, return to the Emergency Department for a recheck.

## 2016-03-29 NOTE — ED Notes (Signed)
Bed: [facility] Expected date:  Expected time:  Means of arrival:  Comments: Denture injury

## 2016-03-29 NOTE — ED Provider Notes (Signed)
[facility]-EMERGENCY DEPT Provider Note   CSN: [id number] Arrival date & time: 03/29/16  1608     History   Chief Complaint Chief Complaint  Patient presents with  . Mouth Injury    HPI Yvonne [name] is a 83 y.o. female.  HPI   Pt sent in by nursing facility after partial denture became stuck under patient's tongue while eating.  The partial is not currently stuck.  Pt reports some soreness with palpation under her tongue but denies any pain while not being examined.  Pt has no concerns.  Daughter is bedside and states she does not think pt needs any workup for this.  She states pt is very sensitive and does not tolerate pain at all, would definitely be complaining if there was a problem.  Pt denies any oral or dental pain, difficulty swallowing or breathing.    Past Medical History:  Diagnosis Date  . ARF (acute renal failure) (HCC)   . CHF (congestive heart failure) (HCC)   . Hypercholesteremia   . Hypertension   . Paroxysmal atrial fibrillation (HCC)   . TIA (transient ischemic attack)     Patient Active Problem List   Diagnosis Date Noted  . Acute respiratory failure (HCC) 10/25/2015  . Hypoxia 10/25/2015  . Fall 10/25/2015  . Acute on chronic diastolic heart failure (HCC) 10/25/2015  . SOB (shortness of breath)   . Lewy body dementia without behavioral disturbance   . Atrial fibrillation with RVR (HCC)   . Left knee pain   . Pneumonia   . CHF (congestive heart failure) (HCC) 05/20/2015  . Acute congestive heart failure (HCC)   . Acute respiratory failure with hypoxia (HCC) 01/24/2015  . Acute on chronic diastolic CHF (congestive heart failure), NYHA class 2 (HCC) 01/24/2015  . Hypertensive urgency 01/24/2015  . HTN (hypertension) 08/08/2014  . CKD (chronic kidney disease) stage 3, GFR 30-59 ml/min 08/08/2014  . History of CVA (cerebrovascular accident) 08/05/2014  . Frequent falls 08/05/2014  . Frequency of urination 08/05/2014  . Cognitive impairment 08/05/2014   . COPD (chronic obstructive pulmonary disease) (HCC) 08/05/2014  . Distal radius fracture, left 06/12/2014  . Wrist fracture, closed 06/12/2014  . Other specified cardiac dysrhythmias(427.89) 04/08/2013  . Hypomagnesemia 04/07/2013  . Abnormal urinalysis 04/05/2013  . Dehydration 04/05/2013  . Orthostatic hypotension 04/05/2013  . Hypokalemia 04/05/2013  . Acute CVA (cerebrovascular accident)-nonhemorrhagic 04/04/2013    Past Surgical History:  Procedure Laterality Date  . BACK SURGERY    . ORIF WRIST FRACTURE Left 06/12/2014  . ORIF WRIST FRACTURE Left 06/12/2014   Procedure: OPEN REDUCTION INTERNAL FIXATION (ORIF) WRIST FRACTURE;  Surgeon: Bradly Bienenstock, MD;  Location: [facility] OR;  Service: Orthopedics;  Laterality: Left;    OB History    No data available       Home Medications    Prior to Admission medications   Medication Sig Start Date End Date Taking? Authorizing Provider  acetaminophen (TYLENOL) 325 MG tablet Take 2 tablets (650 mg total) by mouth every 6 (six) hours as needed for fever, headache, mild pain or moderate pain. Patient taking differently: Take 650 mg by mouth 2 (two) times daily as needed for mild pain, moderate pain, fever or headache.  06/14/14   Nonie Hoyer, PA-C  albuterol (PROVENTIL HFA;VENTOLIN HFA) 108 (90 BASE) MCG/ACT inhaler Inhale 2 puffs into the lungs every 6 (six) hours as needed for wheezing or shortness of breath. 04/09/13   Catarina Hartshorn, MD  calcium-vitamin D [place] Rummage WITH D)  500-200 MG-UNIT tablet Take 1 tablet by mouth daily with breakfast.    Historical Provider, MD  clopidogrel (PLAVIX) 75 MG tablet Take 1 tablet (75 mg total) by mouth daily with breakfast. 04/09/13   Catarina Hartshornavid Tat, MD  Fluticasone-Salmeterol (ADVAIR DISKUS) 250-50 MCG/DOSE AEPB Inhale 1 puff into the lungs 2 (two) times daily. 08/30/14   Ripudeep Jenna LuoK Rai, MD  furosemide (LASIX) 40 MG tablet Take 40 mg by mouth daily.    Historical Provider, MD  ipratropium-albuterol (DUONEB) 0.5-2.5 (3)  MG/3ML SOLN Take 3 mLs by nebulization every 4 (four) hours as needed. Patient taking differently: Take 3 mLs by nebulization every 4 (four) hours as needed (shortness of breath).  01/27/15   Dorothea OgleIskra M Myers, MD  levothyroxine (SYNTHROID, LEVOTHROID) 25 MCG tablet Take 25 mcg by mouth daily at 12 noon. 05/14/15   Historical Provider, MD  magnesium hydroxide (MILK OF MAGNESIA) 400 MG/5ML suspension Take 30 mLs by mouth daily as needed for mild constipation.    Historical Provider, MD  metoprolol tartrate (LOPRESSOR) 25 MG tablet Take 37.5 mg by mouth daily.  04/30/15   Historical Provider, MD  ondansetron (ZOFRAN) 4 MG tablet Take 4 mg by mouth every 6 (six) hours as needed for nausea or vomiting.    Historical Provider, MD  pantoprazole (PROTONIX) 40 MG tablet Take 40 mg by mouth daily.    Historical Provider, MD  potassium chloride SA (K-DUR,KLOR-CON) 20 MEQ tablet Take 20 mEq by mouth daily at 12 noon. 04/24/15   Historical Provider, MD  ranitidine (ZANTAC) 150 MG tablet Take 150 mg by mouth at bedtime.    Historical Provider, MD  sertraline (ZOLOFT) 50 MG tablet Take 50 mg by mouth daily.     Historical Provider, MD    Family History Family History  Problem Relation Age of Onset  . Hypertension Mother   . Hypertension Father     Social History Social History  Substance Use Topics  . Smoking status: Former Smoker    Quit date: 04/05/1983  . Smokeless tobacco: Never Used  . Alcohol use No     Allergies   Patient has no known allergies.   Review of Systems Review of Systems  Constitutional: Negative for fever.  HENT: Positive for mouth sores. Negative for dental problem, facial swelling, sore throat and trouble swallowing.   Respiratory: Negative for shortness of breath, wheezing and stridor.      Physical Exam Updated Vital Signs BP 137/76 (BP Location: Right Arm)   Pulse 104   Temp 97.6 F (36.4 C) (Oral)   Resp 18   SpO2 90%   Physical Exam  Constitutional: She appears  well-developed and well-nourished.  HENT:  Head: Normocephalic and atraumatic.  Small area of ecchymosis vs tiny hematoma under tongue on the left floor of mouth, mildly tender to palpation.  No active bleeding.  No laceration.  No palpable foreign body.  Right lower molar with decay, nontender to palpation.  Partial denture does appear to have rough edge as if small piece of metal has broken off.   Neck: Neck supple.  Pulmonary/Chest: Effort normal.  Neurological: She is alert.  Nursing note and vitals reviewed.    ED Treatments / Results  Labs (all labs ordered are listed, but only abnormal results are displayed) Labs Reviewed - No data to display  EKG  EKG Interpretation None       Radiology No results found.  Procedures Procedures (including critical care time)  Medications Ordered in ED Medications -  No data to display   Initial Impression / Assessment and Plan / ED Course  I have reviewed the triage vital signs and the nursing notes.  Pertinent labs & imaging results that were available during my care of the patient were reviewed by me and considered in my medical decision making (see chart for details).  Clinical Course     Afebrile, nontoxic patient with nursing home concern for mouth injury after partial denture got stuck? Under patient's tongue.  Partial was placed properly in mouth when I saw pt.  Pt denies any problems or any pain.  Examined patient and partial denture alongside daughter with whom I discussed my findings and options for workup.  Daughter declines workup at this time as she thinks patient is fine.  I agree that this seems appropriate at this time.  Considered antibiotics and discussed this with daughter as well, engaged in joint decision making but we agrees this is likely unnecessary given the very tiny injury.  There is no skin opening that I am able to see and no bleeding noted.   D/C home with return precautions, dental/orthodontic follow up.   Discussed result, findings, treatment, and follow up  with patient.  Pt given return precautions.  Pt verbalizes understanding and agrees with plan.       Final Clinical Impressions(s) / ED Diagnoses   Final diagnoses:  Injury of mouth, initial encounter    New Prescriptions New Prescriptions   No medications on file     Trixie Dredge, PA-C 03/29/16 1700    Marily Memos, MD 04/02/16 [phone]

## 2016-03-29 NOTE — ED Triage Notes (Signed)
Per EMS pt from [facility], pt has bridge in mouth that a piece of metal from it cut the right side of her mouth. Bleeding has stopped. Pt alert & orientated x4. Pt is ambulatory.

## 2016-04-18 ENCOUNTER — Encounter (HOSPITAL_COMMUNITY): Payer: Self-pay | Admitting: *Deleted

## 2016-04-18 ENCOUNTER — Emergency Department (HOSPITAL_COMMUNITY)
Admission: EM | Admit: 2016-04-18 | Discharge: 2016-04-18 | Disposition: A | Discharge status: Home / Self Care | Payer: Medicare Other | Attending: Emergency Medicine | Admitting: Emergency Medicine

## 2016-04-18 DIAGNOSIS — Y929 Unspecified place or not applicable: Secondary | ICD-10-CM | POA: Diagnosis not present

## 2016-04-18 DIAGNOSIS — Z87891 Personal history of nicotine dependence: Secondary | ICD-10-CM | POA: Insufficient documentation

## 2016-04-18 DIAGNOSIS — Z972 Presence of dental prosthetic device (complete) (partial): Secondary | ICD-10-CM | POA: Insufficient documentation

## 2016-04-18 DIAGNOSIS — K0889 Other specified disorders of teeth and supporting structures: Secondary | ICD-10-CM

## 2016-04-18 DIAGNOSIS — J449 Chronic obstructive pulmonary disease, unspecified: Secondary | ICD-10-CM | POA: Diagnosis not present

## 2016-04-18 DIAGNOSIS — N183 Chronic kidney disease, stage 3 (moderate): Secondary | ICD-10-CM | POA: Diagnosis not present

## 2016-04-18 DIAGNOSIS — Y939 Activity, unspecified: Secondary | ICD-10-CM | POA: Diagnosis not present

## 2016-04-18 DIAGNOSIS — Z8673 Personal history of transient ischemic attack (TIA), and cerebral infarction without residual deficits: Secondary | ICD-10-CM | POA: Insufficient documentation

## 2016-04-18 DIAGNOSIS — Z79899 Other long term (current) drug therapy: Secondary | ICD-10-CM | POA: Insufficient documentation

## 2016-04-18 DIAGNOSIS — Y999 Unspecified external cause status: Secondary | ICD-10-CM | POA: Diagnosis not present

## 2016-04-18 DIAGNOSIS — S01522A Laceration with foreign body of oral cavity, initial encounter: Secondary | ICD-10-CM | POA: Diagnosis present

## 2016-04-18 DIAGNOSIS — I5033 Acute on chronic diastolic (congestive) heart failure: Secondary | ICD-10-CM | POA: Insufficient documentation

## 2016-04-18 DIAGNOSIS — X58XXXA Exposure to other specified factors, initial encounter: Secondary | ICD-10-CM | POA: Diagnosis not present

## 2016-04-18 DIAGNOSIS — Z7902 Long term (current) use of antithrombotics/antiplatelets: Secondary | ICD-10-CM | POA: Diagnosis not present

## 2016-04-18 DIAGNOSIS — S01512A Laceration without foreign body of oral cavity, initial encounter: Secondary | ICD-10-CM

## 2016-04-18 DIAGNOSIS — I130 Hypertensive heart and chronic kidney disease with heart failure and stage 1 through stage 4 chronic kidney disease, or unspecified chronic kidney disease: Secondary | ICD-10-CM | POA: Insufficient documentation

## 2016-04-18 NOTE — Discharge Instructions (Signed)
You have a small laceration to your tongue from your dentures. This will likely heal well on its own. If you begin having any symptoms of infection, please return to the nearest emergency department. Please follow-up with your dentist for further management of your dentures.

## 2016-04-18 NOTE — ED Notes (Signed)
She arrived with her lower bridge having broken. It had then shifted upward (the device is horeshoe-shaped); with the lower part of the device digging into her lower left gum and piercing a very small area under left side of tongue. Dr. Sherry Ruffing met her upon her arrival and he is able to gently remove it with a needle holder. It appears that a part of the bridge device (metal) broke; and is not reparable by [place]. Her bridge is placed in a denture cup and kept with her at all times. She had overall good dentition; other than where this device fits and replaces two of her teeth. She states she is otherwise well.

## 2016-04-18 NOTE — ED Triage Notes (Signed)
Pt was eating at [facility] [place], Dentures broke, turning and lodging in bottom of tongue.

## 2016-04-18 NOTE — ED Provider Notes (Signed)
[facility]-EMERGENCY DEPT Provider Note   CSN: [id number] Arrival date & time: 04/18/16  1344     History   Chief Complaint No chief complaint on file.   HPI [place] Yvonne [name] is a 83 y.o. female.  The history is provided by the patient and the EMS personnel. The history is limited by the condition of the patient. No language interpreter was used.  Laceration   The incident occurred less than 1 hour ago. The laceration is located on the face (through tongue). Size: 2mm. Injury mechanism: impalement by denture plate fragment. The pain is moderate. The pain has been constant since onset. Possible foreign bodies include metal. Her tetanus status is unknown.    Past Medical History:  Diagnosis Date  . ARF (acute renal failure) (HCC)   . CHF (congestive heart failure) (HCC)   . Hypercholesteremia   . Hypertension   . Paroxysmal atrial fibrillation (HCC)   . TIA (transient ischemic attack)     Patient Active Problem List   Diagnosis Date Noted  . Acute respiratory failure (HCC) 10/25/2015  . Hypoxia 10/25/2015  . Fall 10/25/2015  . Acute on chronic diastolic heart failure (HCC) 10/25/2015  . SOB (shortness of breath)   . Lewy body dementia without behavioral disturbance   . Atrial fibrillation with RVR (HCC)   . Left knee pain   . Pneumonia   . CHF (congestive heart failure) (HCC) 05/20/2015  . Acute congestive heart failure (HCC)   . Acute respiratory failure with hypoxia (HCC) 01/24/2015  . Acute on chronic diastolic CHF (congestive heart failure), NYHA class 2 (HCC) 01/24/2015  . Hypertensive urgency 01/24/2015  . HTN (hypertension) 08/08/2014  . CKD (chronic kidney disease) stage 3, GFR 30-59 ml/min 08/08/2014  . History of CVA (cerebrovascular accident) 08/05/2014  . Frequent falls 08/05/2014  . Frequency of urination 08/05/2014  . Cognitive impairment 08/05/2014  . COPD (chronic obstructive pulmonary disease) (HCC) 08/05/2014  . Distal radius fracture, left 06/12/2014    . Wrist fracture, closed 06/12/2014  . Other specified cardiac dysrhythmias(427.89) 04/08/2013  . Hypomagnesemia 04/07/2013  . Abnormal urinalysis 04/05/2013  . Dehydration 04/05/2013  . Orthostatic hypotension 04/05/2013  . Hypokalemia 04/05/2013  . Acute CVA (cerebrovascular accident)-nonhemorrhagic 04/04/2013    Past Surgical History:  Procedure Laterality Date  . BACK SURGERY    . ORIF WRIST FRACTURE Left 06/12/2014  . ORIF WRIST FRACTURE Left 06/12/2014   Procedure: OPEN REDUCTION INTERNAL FIXATION (ORIF) WRIST FRACTURE;  Surgeon: Bradly Bienenstock, MD;  Location: [facility] OR;  Service: Orthopedics;  Laterality: Left;    OB History    No data available       Home Medications    Prior to Admission medications   Medication Sig Start Date End Date Taking? Authorizing Provider  acetaminophen (TYLENOL) 325 MG tablet Take 2 tablets (650 mg total) by mouth every 6 (six) hours as needed for fever, headache, mild pain or moderate pain. Patient taking differently: Take 650 mg by mouth 2 (two) times daily as needed for mild pain, moderate pain, fever or headache.  06/14/14   Nonie Hoyer, PA-C  albuterol (PROVENTIL HFA;VENTOLIN HFA) 108 (90 BASE) MCG/ACT inhaler Inhale 2 puffs into the lungs every 6 (six) hours as needed for wheezing or shortness of breath. 04/09/13   Catarina Hartshorn, MD  calcium-vitamin D (OSCAL WITH D) 500-200 MG-UNIT tablet Take 1 tablet by mouth daily with breakfast.    Historical Provider, MD  clopidogrel (PLAVIX) 75 MG tablet Take 1 tablet (75 mg  total) by mouth daily with breakfast. 04/09/13   Catarina Hartshorn, MD  Fluticasone-Salmeterol (ADVAIR DISKUS) 250-50 MCG/DOSE AEPB Inhale 1 puff into the lungs 2 (two) times daily. 08/30/14   Ripudeep Jenna Luo, MD  furosemide (LASIX) 40 MG tablet Take 40 mg by mouth daily.    Historical Provider, MD  ipratropium-albuterol (DUONEB) 0.5-2.5 (3) MG/3ML SOLN Take 3 mLs by nebulization every 4 (four) hours as needed. Patient taking differently: Take 3 mLs  by nebulization every 4 (four) hours as needed (shortness of breath).  01/27/15   Dorothea Ogle, MD  levothyroxine (SYNTHROID, LEVOTHROID) 25 MCG tablet Take 25 mcg by mouth daily at 12 noon. 05/14/15   Historical Provider, MD  magnesium hydroxide (MILK OF MAGNESIA) 400 MG/5ML suspension Take 30 mLs by mouth daily as needed for mild constipation.    Historical Provider, MD  metoprolol tartrate (LOPRESSOR) 25 MG tablet Take 37.5 mg by mouth daily.  04/30/15   Historical Provider, MD  ondansetron (ZOFRAN) 4 MG tablet Take 4 mg by mouth every 6 (six) hours as needed for nausea or vomiting.    Historical Provider, MD  pantoprazole (PROTONIX) 40 MG tablet Take 40 mg by mouth daily.    Historical Provider, MD  potassium chloride SA (K-DUR,KLOR-CON) 20 MEQ tablet Take 20 mEq by mouth daily at 12 noon. 04/24/15   Historical Provider, MD  ranitidine (ZANTAC) 150 MG tablet Take 150 mg by mouth at bedtime.    Historical Provider, MD  sertraline (ZOLOFT) 50 MG tablet Take 50 mg by mouth daily.     Historical Provider, MD    Family History Family History  Problem Relation Age of Onset  . Hypertension Mother   . Hypertension Father     Social History Social History  Substance Use Topics  . Smoking status: Former Smoker    Quit date: 04/05/1983  . Smokeless tobacco: Never Used  . Alcohol use No     Allergies   Patient has no known allergies.   Review of Systems Review of Systems  Unable to perform ROS: Patient nonverbal  Constitutional: Negative for chills.  HENT: Positive for dental problem.   Eyes: Negative for visual disturbance.  Respiratory: Negative for cough, chest tightness, shortness of breath, wheezing and stridor.   Cardiovascular: Negative for chest pain.  Gastrointestinal: Negative for abdominal pain, constipation, diarrhea and nausea.  Genitourinary: Negative for dysuria.  Musculoskeletal: Negative for back pain.  Skin: Positive for wound. Negative for rash.  Neurological:  Negative for headaches.  Psychiatric/Behavioral: Positive for agitation. Negative for confusion.  All other systems reviewed and are negative.    Physical Exam Updated Vital Signs There were no vitals taken for this visit.  Physical Exam  Constitutional: She appears well-developed and well-nourished. She appears distressed.  HENT:  Head: Normocephalic and atraumatic.  Right Ear: External ear normal.  Left Ear: External ear normal.  Mouth/Throat: She has dentures. Abnormal dentition. Lacerations present. No dental abscesses.  Impalement of sharp part of denture plate through small part of inferior aspect of tongue. Denture plate is tracked and mouth preventing mouth closure. Significant pain in tongue. Small bleeding.  Eyes: Conjunctivae are normal. Pupils are equal, round, and reactive to light.  Neck: Neck supple.  Cardiovascular: Normal rate and regular rhythm.   No murmur heard. Pulmonary/Chest: Effort normal and breath sounds normal. No respiratory distress. She has no wheezes. She exhibits no tenderness.  Abdominal: Soft. There is no tenderness.  Musculoskeletal: She exhibits no edema.  Neurological: She is  alert.  Skin: Skin is warm and dry.  Psychiatric: She has a normal mood and affect.  Nursing note and vitals reviewed.    ED Treatments / Results  Labs (all labs ordered are listed, but only abnormal results are displayed) Labs Reviewed - No data to display  EKG  EKG Interpretation None       Radiology No results found.  Procedures .Foreign Body Removal Date/Time: 04/19/2016 11:28 AM Performed by: Heide ScalesEGELER, CHRISTOPHER J Authorized by: Heide ScalesEGELER, CHRISTOPHER J  Consent: The procedure was performed in an emergent situation. Risks and benefits: risks, benefits and alternatives were discussed Consent given by: patient Patient understanding: patient states understanding of the procedure being performed Patient identity confirmed: verbally with patient and arm  band Time out: Immediately prior to procedure a "time out" was called to verify the correct patient, procedure, equipment, support staff and site/side marked as required. Body area: mucosa General location: head/neck Location details: mouth  Sedation: Patient sedated: no Patient restrained: yes Patient cooperative: yes Removal mechanism: forceps Complexity: simple 1 objects recovered. Objects recovered: Denture plate Post-procedure assessment: foreign body removed Patient tolerance: Patient tolerated the procedure well with no immediate complications   (including critical care time)  Medications Ordered in ED Medications - No data to display   Initial Impression / Assessment and Plan / ED Course  I have reviewed the triage vital signs and the nursing notes.  Pertinent labs & imaging results that were available during my care of the patient were reviewed by me and considered in my medical decision making (see chart for details).     [place] Duffy BruceK Betley is a 83 y.o. female Who presents with a tongue laceration by impalement of denture fragment. Patient brought in by EMS in distress and agitation. According to EMS, patient was eating breakfast and her denture plate broke sending a shard of the plate through the underside of her tongue. There was mild bleeding but patient was distress due to inability to close her mouth. Patient was breathing quickly and agitated however she did not appear to be in respiratory distress. Patient had no stridor.  On exam, it appears dental plate broke and a sharp edge went through the underside of her tongue. Mild bleeding. No other evidence of injuries. No voice change or stridor. Lungs clear.  With assistance of nursing, patient's head was secured and tissue was removed from the impaled book of the dental plate. Subsequently, this was released and removed from her mouth. After removal, patient had resolution of her distress. Bleeding stopped. Laceration  difficult to find on reassessments and was already healing. No evidence of foreign body retention. Wound was suctioned and cleared out.  Patient instructed to not use dental plate until reevaluated by dentistry.  Given micro laceration in her mouth, do not feel patient require stitches as this will heal quickly. Patient already hemostatic. Patient and family agreed to follow up with PCP. Patient had no other complaints and appeared well. Patient discharged in good condition with no other complaints.   Final Clinical Impressions(s) / ED Diagnoses   Final diagnoses:  Laceration of tongue, initial encounter  Poorly fitting dentures    New Prescriptions Discharge Medication List as of 04/18/2016  2:22 PM     Clinical Impression: 1. Laceration of tongue, initial encounter   2. Poorly fitting dentures     Disposition: Discharge  Condition: Good  I have discussed the results, Dx and Tx plan with the pt(& family if present). He/she/they expressed understanding and  agree(s) with the plan. Discharge instructions discussed at great length. Strict return precautions discussed and pt &/or family have verbalized understanding of the instructions. No further questions at time of discharge.    Discharge Medication List as of 04/18/2016  2:22 PM      Follow Up: Juluis Rainier, MD [address] [place] [address] [phone]     [facility]-EMERGENCY DEPT [address] [id number] [place] [address] [phone]  If symptoms worsen     Heide Scales, MD 04/19/16 [phone]

## 2016-07-07 IMAGING — CR DG CHEST 2V
2 series · 2 of 2 positions shown · non-contrast
Comparison: 06/13/2014

CLINICAL DATA: Shortness of breath last night.  History of COPD.

EXAM:
CHEST  2 VIEW

[w chest lat]
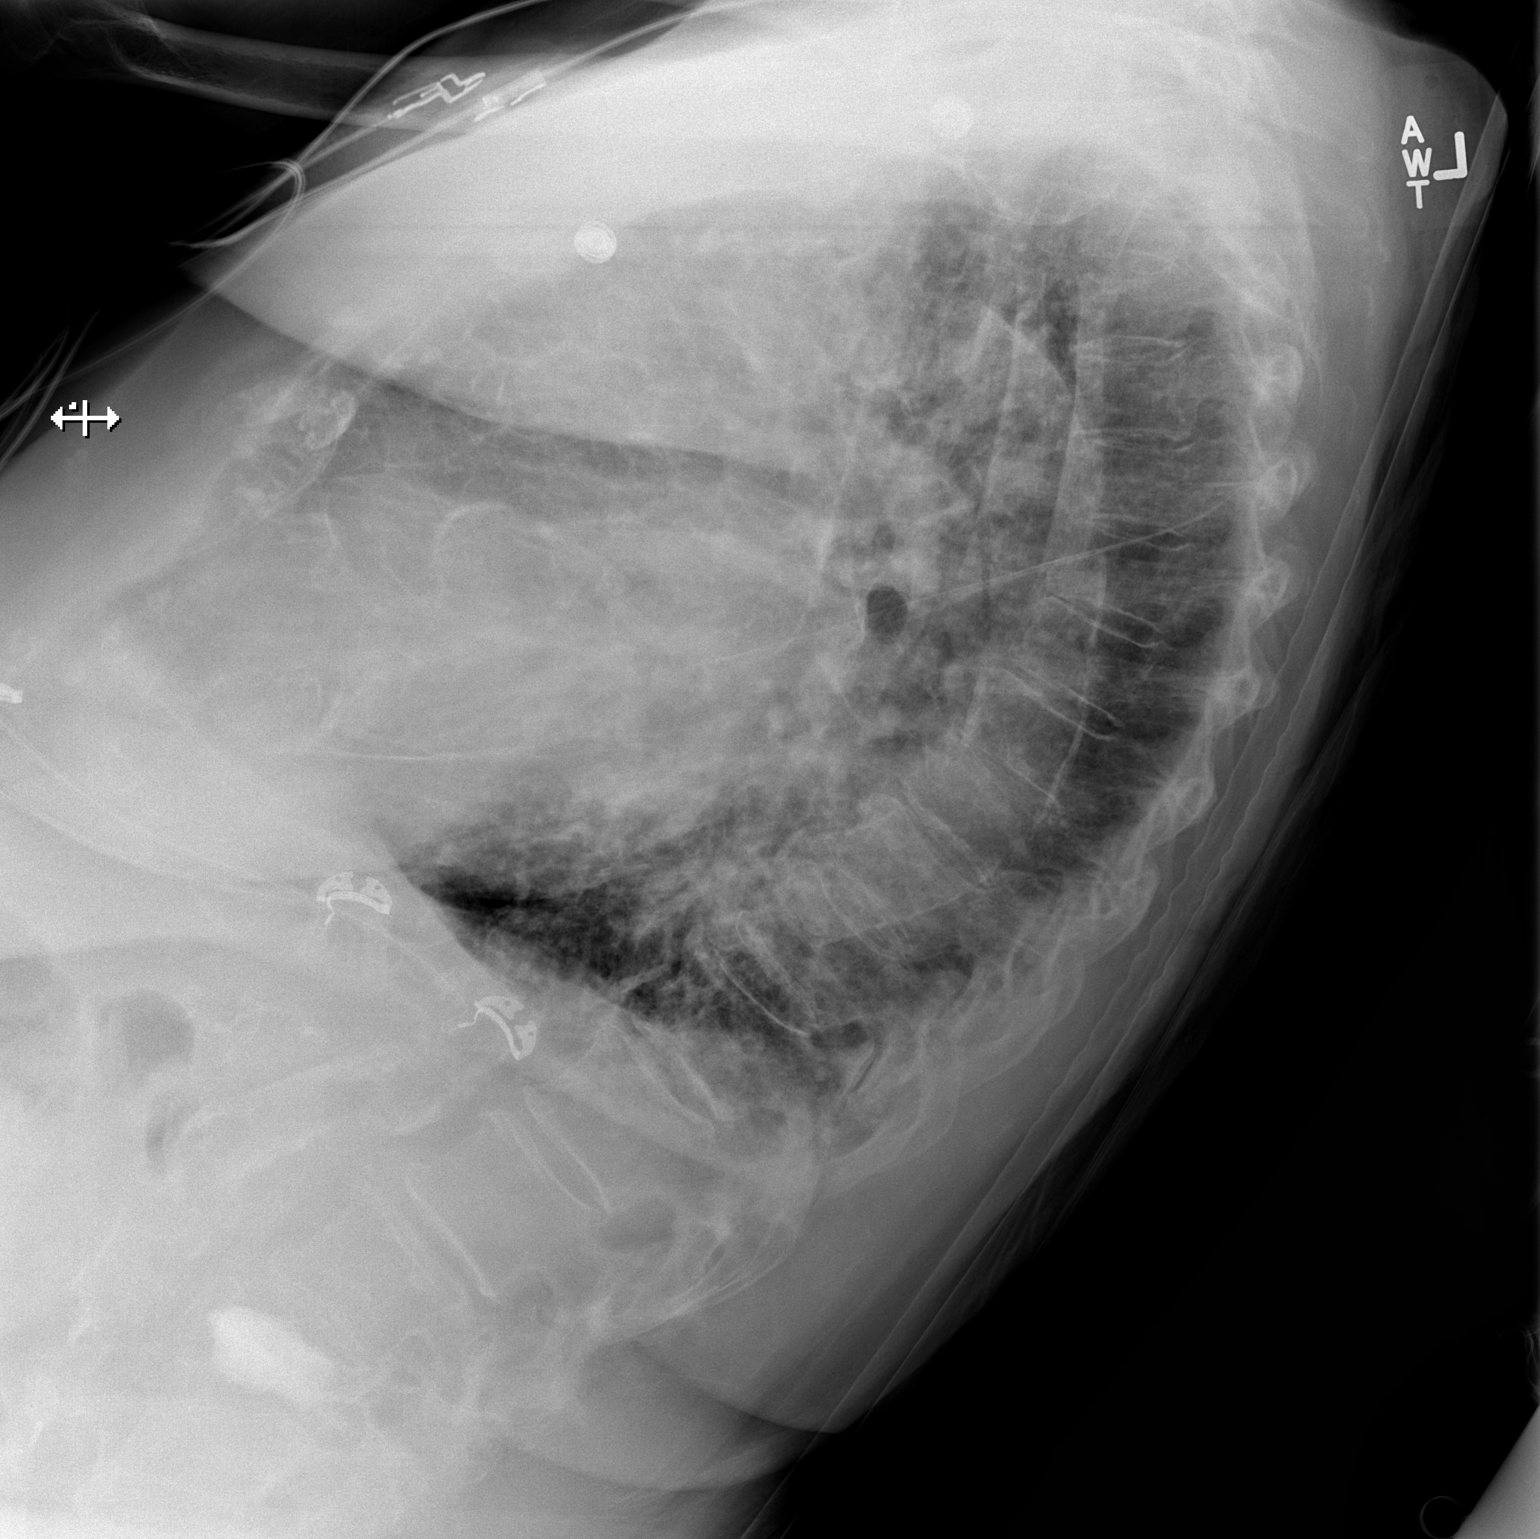

[x chest ap]
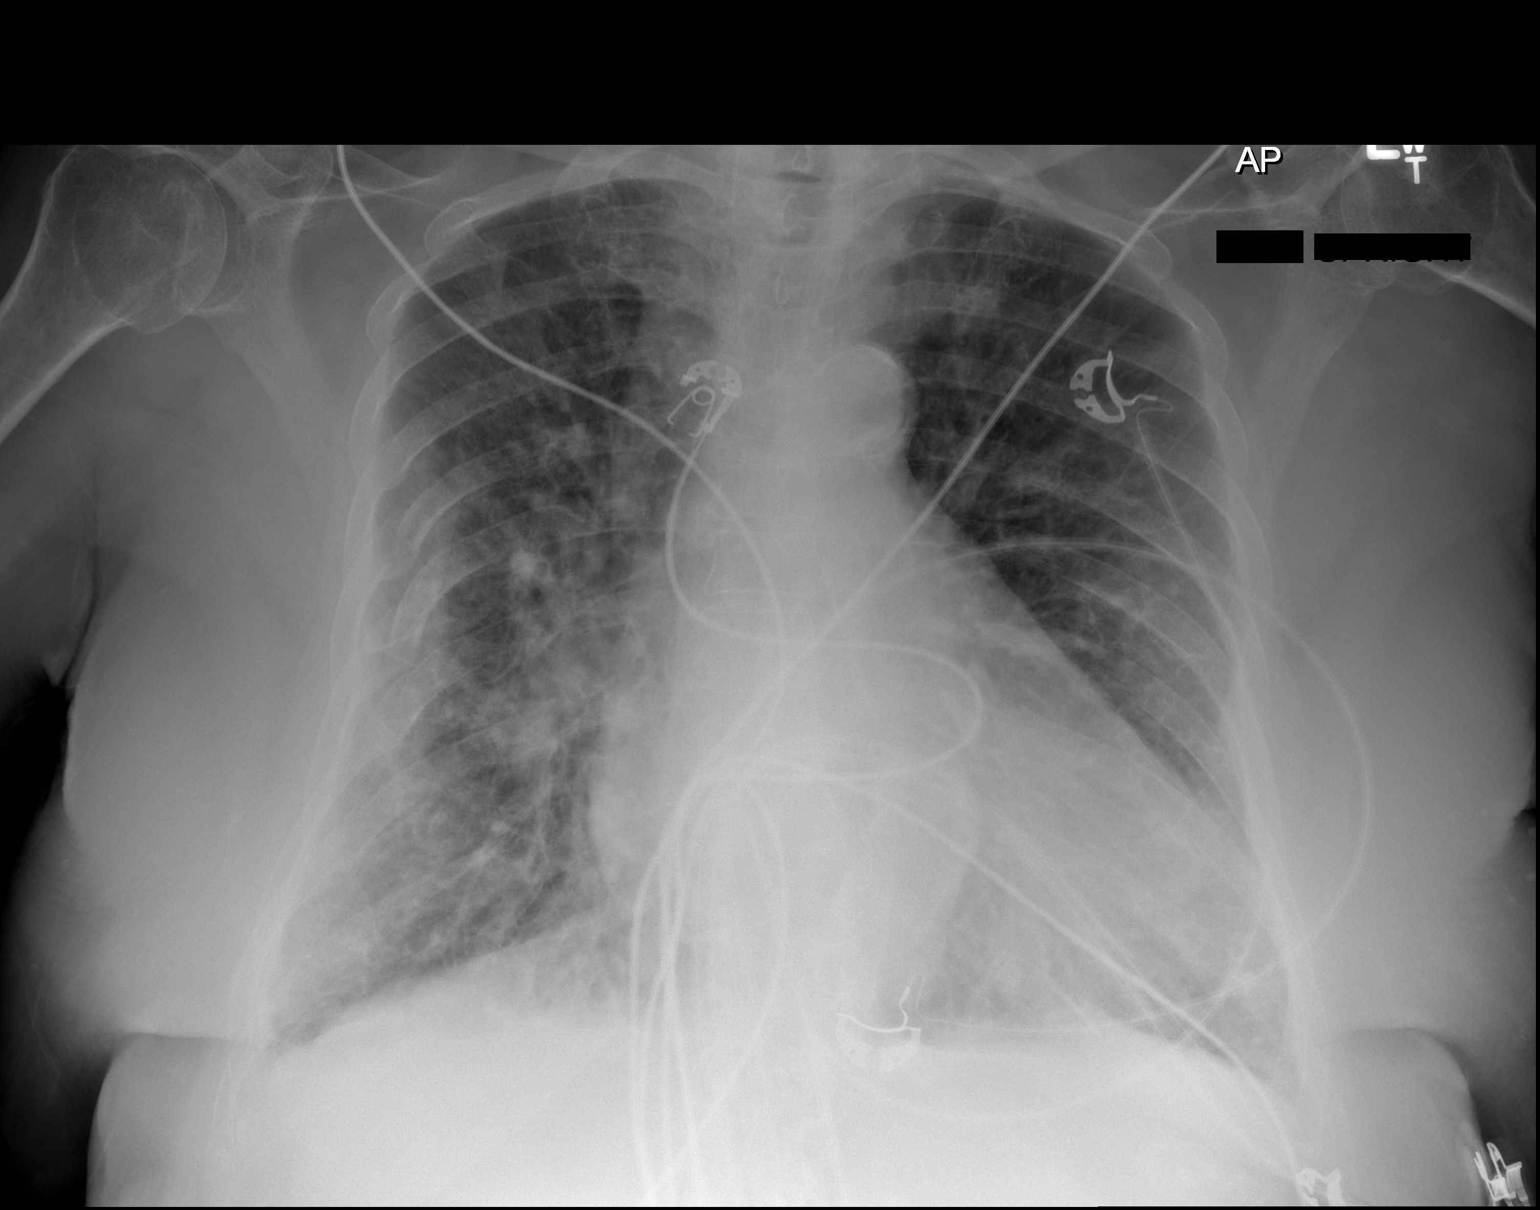

[2 of 2 positions shown; findings below may reference images not displayed]

FINDINGS: Cardiomegaly with vascular congestion and diffuse interstitial
prominence which may reflect interstitial edema. Mild hyperinflation
compatible with COPD. No effusions. Tortuosity and calcifications of
the thoracic aorta.
IMPRESSION: COPD.

Cardiomegaly with vascular congestion and interstitial prominence,
possibly interstitial edema.

## 2016-08-01 ENCOUNTER — Emergency Department (HOSPITAL_COMMUNITY): Payer: Medicare Other

## 2016-08-01 ENCOUNTER — Emergency Department (HOSPITAL_COMMUNITY)
Admission: EM | Admit: 2016-08-01 | Discharge: 2016-08-01 | Disposition: A | Discharge status: Home / Self Care | Payer: Medicare Other | Attending: Emergency Medicine | Admitting: Emergency Medicine

## 2016-08-01 ENCOUNTER — Encounter (HOSPITAL_COMMUNITY): Payer: Self-pay

## 2016-08-01 DIAGNOSIS — S0083XA Contusion of other part of head, initial encounter: Secondary | ICD-10-CM | POA: Insufficient documentation

## 2016-08-01 DIAGNOSIS — N183 Chronic kidney disease, stage 3 (moderate): Secondary | ICD-10-CM | POA: Insufficient documentation

## 2016-08-01 DIAGNOSIS — I130 Hypertensive heart and chronic kidney disease with heart failure and stage 1 through stage 4 chronic kidney disease, or unspecified chronic kidney disease: Secondary | ICD-10-CM | POA: Insufficient documentation

## 2016-08-01 DIAGNOSIS — Y9289 Other specified places as the place of occurrence of the external cause: Secondary | ICD-10-CM | POA: Diagnosis not present

## 2016-08-01 DIAGNOSIS — Z8673 Personal history of transient ischemic attack (TIA), and cerebral infarction without residual deficits: Secondary | ICD-10-CM | POA: Diagnosis not present

## 2016-08-01 DIAGNOSIS — S0990XA Unspecified injury of head, initial encounter: Secondary | ICD-10-CM | POA: Diagnosis present

## 2016-08-01 DIAGNOSIS — Y9389 Activity, other specified: Secondary | ICD-10-CM | POA: Insufficient documentation

## 2016-08-01 DIAGNOSIS — Z79899 Other long term (current) drug therapy: Secondary | ICD-10-CM | POA: Diagnosis not present

## 2016-08-01 DIAGNOSIS — Z87891 Personal history of nicotine dependence: Secondary | ICD-10-CM | POA: Diagnosis not present

## 2016-08-01 DIAGNOSIS — J449 Chronic obstructive pulmonary disease, unspecified: Secondary | ICD-10-CM | POA: Diagnosis not present

## 2016-08-01 DIAGNOSIS — I5033 Acute on chronic diastolic (congestive) heart failure: Secondary | ICD-10-CM | POA: Diagnosis not present

## 2016-08-01 DIAGNOSIS — Y999 Unspecified external cause status: Secondary | ICD-10-CM | POA: Diagnosis not present

## 2016-08-01 DIAGNOSIS — W19XXXA Unspecified fall, initial encounter: Secondary | ICD-10-CM | POA: Insufficient documentation

## 2016-08-01 LAB — BASIC METABOLIC PANEL
Anion gap: 8 (ref 5–15)
BUN: 31 mg/dL — ABNORMAL HIGH (ref 6–20)
CALCIUM: 8.9 mg/dL (ref 8.9–10.3)
CO2: 26 mmol/L (ref 22–32)
CREATININE: 1.07 mg/dL — AB (ref 0.44–1.00)
Chloride: 106 mmol/L (ref 101–111)
GFR, EST AFRICAN AMERICAN: 49 mL/min — AB (ref >60)
GFR, EST NON AFRICAN AMERICAN: 42 mL/min — AB (ref >60)
Glucose, Bld: 108 mg/dL — ABNORMAL HIGH (ref 65–99)
Potassium: 4.1 mmol/L (ref 3.5–5.1)
SODIUM: 140 mmol/L (ref 135–145)

## 2016-08-01 LAB — CBC
HCT: 40.1 % (ref 36.0–46.0)
HEMOGLOBIN: 12.8 g/dL (ref 12.0–15.0)
MCH: 30.4 pg (ref 26.0–34.0)
MCHC: 31.9 g/dL (ref 30.0–36.0)
MCV: 95.2 fL (ref 78.0–100.0)
Platelets: 160 10*3/uL (ref 150–400)
RBC: 4.21 MIL/uL (ref 3.87–5.11)
RDW: 15.4 % (ref 11.5–15.5)
WBC: 6.4 10*3/uL (ref 4.0–10.5)

## 2016-08-01 NOTE — ED Notes (Signed)
Patient transported to CT 

## 2016-08-01 NOTE — ED Provider Notes (Signed)
[facility]-EMERGENCY DEPT Provider Note   CSN: [id number] Arrival date & time: 08/01/16  0950     History   Chief Complaint No chief complaint on file.   HPI Yvonne [name] is a 83 y.o. female.  HPI  Pt presents to the emergency department after an    Past Medical History:  Diagnosis Date  . ARF (acute renal failure) (HCC)   . CHF (congestive heart failure) (HCC)   . Hypercholesteremia   . Hypertension   . Paroxysmal atrial fibrillation (HCC)   . TIA (transient ischemic attack)     Patient Active Problem List   Diagnosis Date Noted  . Acute respiratory failure (HCC) 10/25/2015  . Hypoxia 10/25/2015  . Fall 10/25/2015  . Acute on chronic diastolic heart failure (HCC) 10/25/2015  . SOB (shortness of breath)   . Lewy body dementia without behavioral disturbance   . Atrial fibrillation with RVR (HCC)   . Left knee pain   . Pneumonia   . CHF (congestive heart failure) (HCC) 05/20/2015  . Acute congestive heart failure (HCC)   . Acute respiratory failure with hypoxia (HCC) 01/24/2015  . Acute on chronic diastolic CHF (congestive heart failure), NYHA class 2 (HCC) 01/24/2015  . Hypertensive urgency 01/24/2015  . HTN (hypertension) 08/08/2014  . CKD (chronic kidney disease) stage 3, GFR 30-59 ml/min 08/08/2014  . History of CVA (cerebrovascular accident) 08/05/2014  . Frequent falls 08/05/2014  . Frequency of urination 08/05/2014  . Cognitive impairment 08/05/2014  . COPD (chronic obstructive pulmonary disease) (HCC) 08/05/2014  . Distal radius fracture, left 06/12/2014  . Wrist fracture, closed 06/12/2014  . Other specified cardiac dysrhythmias(427.89) 04/08/2013  . Hypomagnesemia 04/07/2013  . Abnormal urinalysis 04/05/2013  . Dehydration 04/05/2013  . Orthostatic hypotension 04/05/2013  . Hypokalemia 04/05/2013  . Acute CVA (cerebrovascular accident)-nonhemorrhagic 04/04/2013    Past Surgical History:  Procedure Laterality Date  . BACK SURGERY    . ORIF  WRIST FRACTURE Left 06/12/2014  . ORIF WRIST FRACTURE Left 06/12/2014   Procedure: OPEN REDUCTION INTERNAL FIXATION (ORIF) WRIST FRACTURE;  Surgeon: Bradly Bienenstock, MD;  Location: [facility] OR;  Service: Orthopedics;  Laterality: Left;    OB History    No data available       Home Medications    Prior to Admission medications   Medication Sig Start Date End Date Taking? Authorizing Provider  calcium-vitamin D (OSCAL WITH D) 500-200 MG-UNIT tablet Take 1 tablet by mouth daily with breakfast.   Yes [provider]  Fluticasone-Salmeterol (ADVAIR DISKUS) 250-50 MCG/DOSE AEPB Inhale 1 puff into the lungs 2 (two) times daily. 08/30/14  Yes Rai, Ripudeep K, MD  furosemide (LASIX) 20 MG tablet Take 20 mg by mouth every other day.    Yes [provider]  HYDROcodone-acetaminophen (NORCO/VICODIN) 5-325 MG tablet Take 0.5 tablets by mouth 3 (three) times daily as needed for pain. 07/21/16  Yes [provider]  hydroxypropyl methylcellulose / hypromellose (ISOPTO TEARS / GONIOVISC) 2.5 % ophthalmic solution Place 1 drop into both eyes 3 (three) times daily.   Yes [provider]  levothyroxine (SYNTHROID, LEVOTHROID) 25 MCG tablet Take 25 mcg by mouth daily at 12 noon. 05/14/15  Yes [provider]  magnesium hydroxide (MILK OF MAGNESIA) 400 MG/5ML suspension Take 30 mLs by mouth daily as needed for mild constipation.   Yes [provider]  metoprolol tartrate (LOPRESSOR) 25 MG tablet Take 25 mg by mouth daily.  04/30/15  Yes [provider]  ondansetron (ZOFRAN) 4  MG tablet Take 4 mg by mouth every 6 (six) hours as needed for nausea or vomiting.   Yes [provider]  pantoprazole (PROTONIX) 40 MG tablet Take 40 mg by mouth daily.   Yes [provider]  potassium chloride SA (K-DUR,KLOR-CON) 20 MEQ tablet Take 20 mEq by mouth daily at 12 noon. 04/24/15  Yes [provider]  ranitidine (ZANTAC) 150 MG tablet Take 150 mg by mouth  at bedtime.   Yes [provider]  sertraline (ZOLOFT) 50 MG tablet Take 50 mg by mouth daily.    Yes [provider]  trolamine salicylate (ASPERCREME) 10 % cream Apply 1 application topically as needed for muscle pain.   Yes [provider]    Family History Family History  Problem Relation Age of Onset  . Hypertension Mother   . Hypertension Father     Social History Social History  Substance Use Topics  . Smoking status: Former Smoker    Quit date: 04/05/1983  . Smokeless tobacco: Never Used  . Alcohol use No     Allergies   Patient has no known allergies.   Review of Systems Review of Systems  Unable to perform ROS: Dementia     Physical Exam Updated Vital Signs BP 124/89 (BP Location: Left Arm)   Pulse 86   Temp 98.1 F (36.7 C) (Oral)   Resp 12   SpO2 98%   Physical Exam  Constitutional: She appears well-developed and well-nourished. No distress.  HENT:  Head: Normocephalic.  Forehead hematoma without abrasion or laceration  Eyes: EOM are normal.  Neck: Normal range of motion.  Cardiovascular: Normal rate, regular rhythm and normal heart sounds.   Pulmonary/Chest: Effort normal and breath sounds normal.  Abdominal: Soft. She exhibits no distension. There is no tenderness.  Musculoskeletal: Normal range of motion.  Full range of motion bilateral hips,knees, ankles.  Full range of motion bilateral shoulders, elbows, wrists.  No thoracic or lumbar tenderness  Neurological: She is alert.  Moves all 4 extremities equally with equal strength.  Able to hold legs off the bed and arms above her head  Skin: Skin is warm and dry.  Psychiatric: She has a normal mood and affect. Judgment normal.  Nursing note and vitals reviewed.    ED Treatments / Results  Labs (all labs ordered are listed, but only abnormal results are displayed) Labs Reviewed  BASIC METABOLIC PANEL - Abnormal; Notable for the following:       Result Value    Glucose, Bld 108 (*)    BUN 31 (*)    Creatinine, Ser 1.07 (*)    GFR calc non Af Amer 42 (*)    GFR calc Af Amer 49 (*)    All other components within normal limits  CBC    EKG  EKG Interpretation None       Radiology Dg Chest 2 View  Result Date: 08/01/2016 CLINICAL DATA:  83 year old female with weakness and unwitnessed fall today. EXAM: CHEST  2 VIEW COMPARISON:  10/26/2015 chest radiograph, 10/25/2015 chest CT and prior studies. FINDINGS: Cardiomegaly noted with pulmonary vascular congestion and possible interstitial edema. Bilateral pleural effusions are noted. There is no evidence of pneumothorax. No definite acute bony abnormalities are identified. IMPRESSION: Cardiomegaly with pulmonary vascular congestion and possible interstitial pulmonary edema. Bilateral pleural effusions. Electronically Signed   By: Harmon Pier M.D.   On: 08/01/2016 11:11   Ct Head Wo Contrast  Result Date: 08/01/2016 CLINICAL DATA:  83 year old  female with head and neck injury from unwitnessed fall. Initial encounter. EXAM: CT HEAD WITHOUT CONTRAST CT CERVICAL SPINE WITHOUT CONTRAST TECHNIQUE: Multidetector CT imaging of the head and cervical spine was performed following the standard protocol without intravenous contrast. Multiplanar CT image reconstructions of the cervical spine were also generated. COMPARISON:  08/06/2014 MR, 04/04/2013 CT and MRI and prior studies. 12/02/2007 cervical spine radiographs FINDINGS: CT HEAD FINDINGS Brain: No evidence of acute infarction, hemorrhage, hydrocephalus, extra-axial collection or mass lesion/mass effect. Atrophy, chronic small-vessel white matter ischemic changes, a remote right basal ganglia infarct and bilateral cerebellar calcifications again noted. Vascular: Intracranial atherosclerotic calcifications identified. Skull: Normal. Negative for fracture or focal lesion. Sinuses/Orbits: No acute abnormality Other: Left forehead soft tissue swelling is noted. CT CERVICAL  SPINE FINDINGS Alignment: 2 mm spondylolisthesis at C4-5 is unchanged. No new subluxation identified. Skull base and vertebrae: No acute fracture. No primary bone lesion or focal pathologic process. Soft tissues and spinal canal: No prevertebral fluid or swelling. No visible canal hematoma. Disc levels: Multilevel degenerative disc disease and spondylosis again noted. Upper chest: A right pleural effusion and apex noted. Other: None IMPRESSION: No evidence of acute intracranial abnormality. Atrophy, chronic small-vessel white matter ischemic changes and remote right basal ganglia infarct. Left forehead soft tissue swelling without fracture. No static evidence of acute injury to the cervical spine. Degenerative changes as described. Right pleural effusion. Electronically Signed   By: Harmon Pier M.D.   On: 08/01/2016 10:47   Ct Cervical Spine Wo Contrast  Result Date: 08/01/2016 CLINICAL DATA:  83 year old female with head and neck injury from unwitnessed fall. Initial encounter. EXAM: CT HEAD WITHOUT CONTRAST CT CERVICAL SPINE WITHOUT CONTRAST TECHNIQUE: Multidetector CT imaging of the head and cervical spine was performed following the standard protocol without intravenous contrast. Multiplanar CT image reconstructions of the cervical spine were also generated. COMPARISON:  08/06/2014 MR, 04/04/2013 CT and MRI and prior studies. 12/02/2007 cervical spine radiographs FINDINGS: CT HEAD FINDINGS Brain: No evidence of acute infarction, hemorrhage, hydrocephalus, extra-axial collection or mass lesion/mass effect. Atrophy, chronic small-vessel white matter ischemic changes, a remote right basal ganglia infarct and bilateral cerebellar calcifications again noted. Vascular: Intracranial atherosclerotic calcifications identified. Skull: Normal. Negative for fracture or focal lesion. Sinuses/Orbits: No acute abnormality Other: Left forehead soft tissue swelling is noted. CT CERVICAL SPINE FINDINGS Alignment: 2 mm  spondylolisthesis at C4-5 is unchanged. No new subluxation identified. Skull base and vertebrae: No acute fracture. No primary bone lesion or focal pathologic process. Soft tissues and spinal canal: No prevertebral fluid or swelling. No visible canal hematoma. Disc levels: Multilevel degenerative disc disease and spondylosis again noted. Upper chest: A right pleural effusion and apex noted. Other: None IMPRESSION: No evidence of acute intracranial abnormality. Atrophy, chronic small-vessel white matter ischemic changes and remote right basal ganglia infarct. Left forehead soft tissue swelling without fracture. No static evidence of acute injury to the cervical spine. Degenerative changes as described. Right pleural effusion. Electronically Signed   By: Harmon Pier M.D.   On: 08/01/2016 10:47    Procedures Procedures (including critical care time)  Medications Ordered in ED Medications - No data to display   Initial Impression / Assessment and Plan / ED Course  I have reviewed the triage vital signs and the nursing notes.  Pertinent labs & imaging results that were available during my care of the patient were reviewed by me and considered in my medical decision making (see chart for details).     Patient is  overall well-appearing.  Her imaging and labs without significant abnormality here in the emergency department.  Discharge home in good condition  Final Clinical Impressions(s) / ED Diagnoses   Final diagnoses:  Fall, initial encounter  Traumatic hematoma of forehead, initial encounter    New Prescriptions Discharge Medication List as of 08/01/2016 12:34 PM       Azalia Bilisampos, Sandralee Tarkington, MD 08/01/16 1621

## 2016-08-01 NOTE — ED Triage Notes (Signed)
Pt coming from assisted living( [facility]) by ems with c/o fall unwitnessed fall. Pt was attempting to open dog cage. Pt hit her right knee and have skin tear and head. Pt also have small hematoma to the left side of the forehead. Pt is not on blood thinners. Pt denies any neck pain or back pain at this time.

## 2016-08-01 NOTE — ED Notes (Signed)
Lab draw attempted unsuccessful due to pt complaint about pain

## 2016-11-27 DEATH — deceased

## 2016-12-03 IMAGING — CR DG CHEST 2V
2 series · 2 of 2 positions shown · non-contrast
Comparison: Chest radiograph performed 10/02/2014

CLINICAL DATA: Acute onset of shortness of breath. Initial
encounter.

EXAM:
CHEST  2 VIEW

[w chest lat]
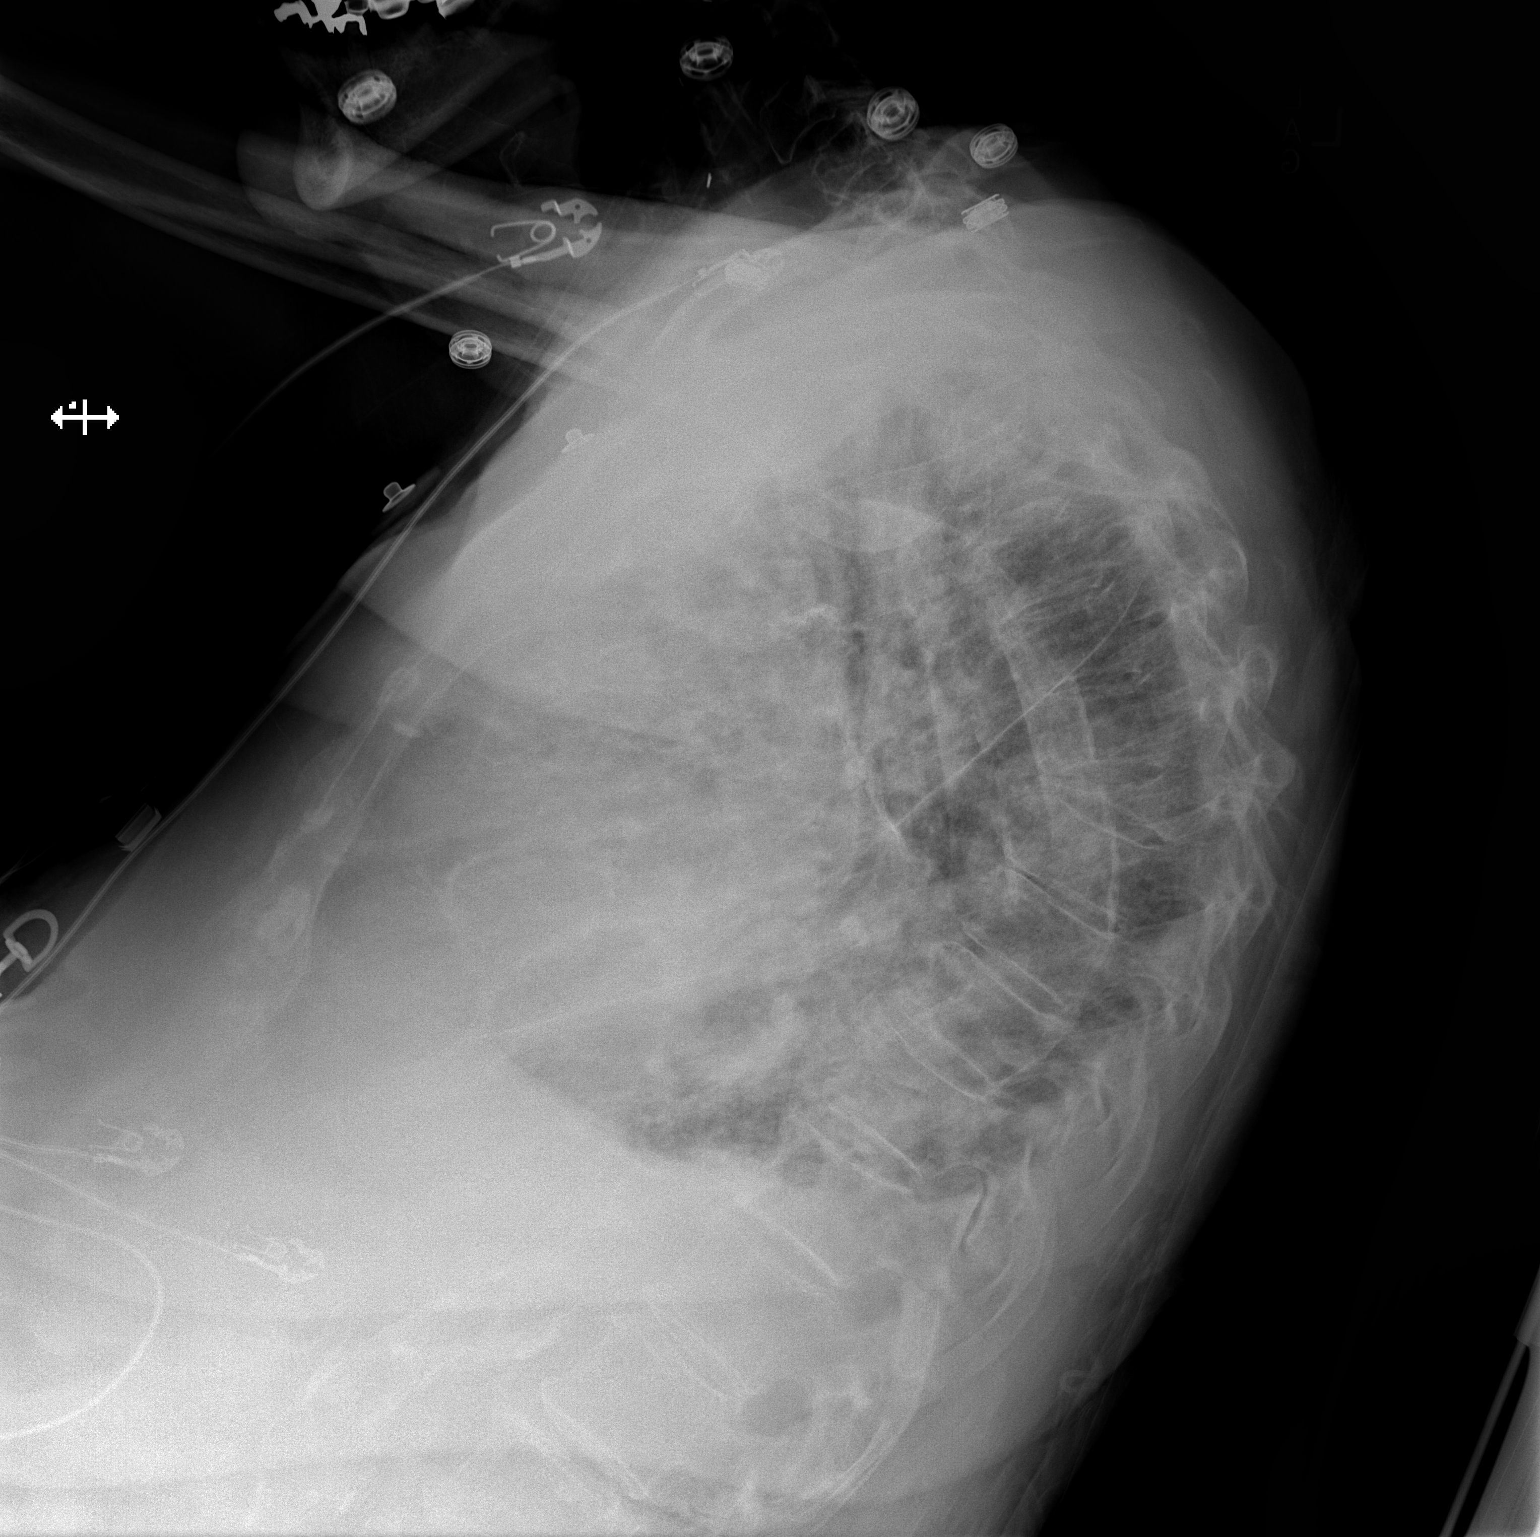

[x chest ap]
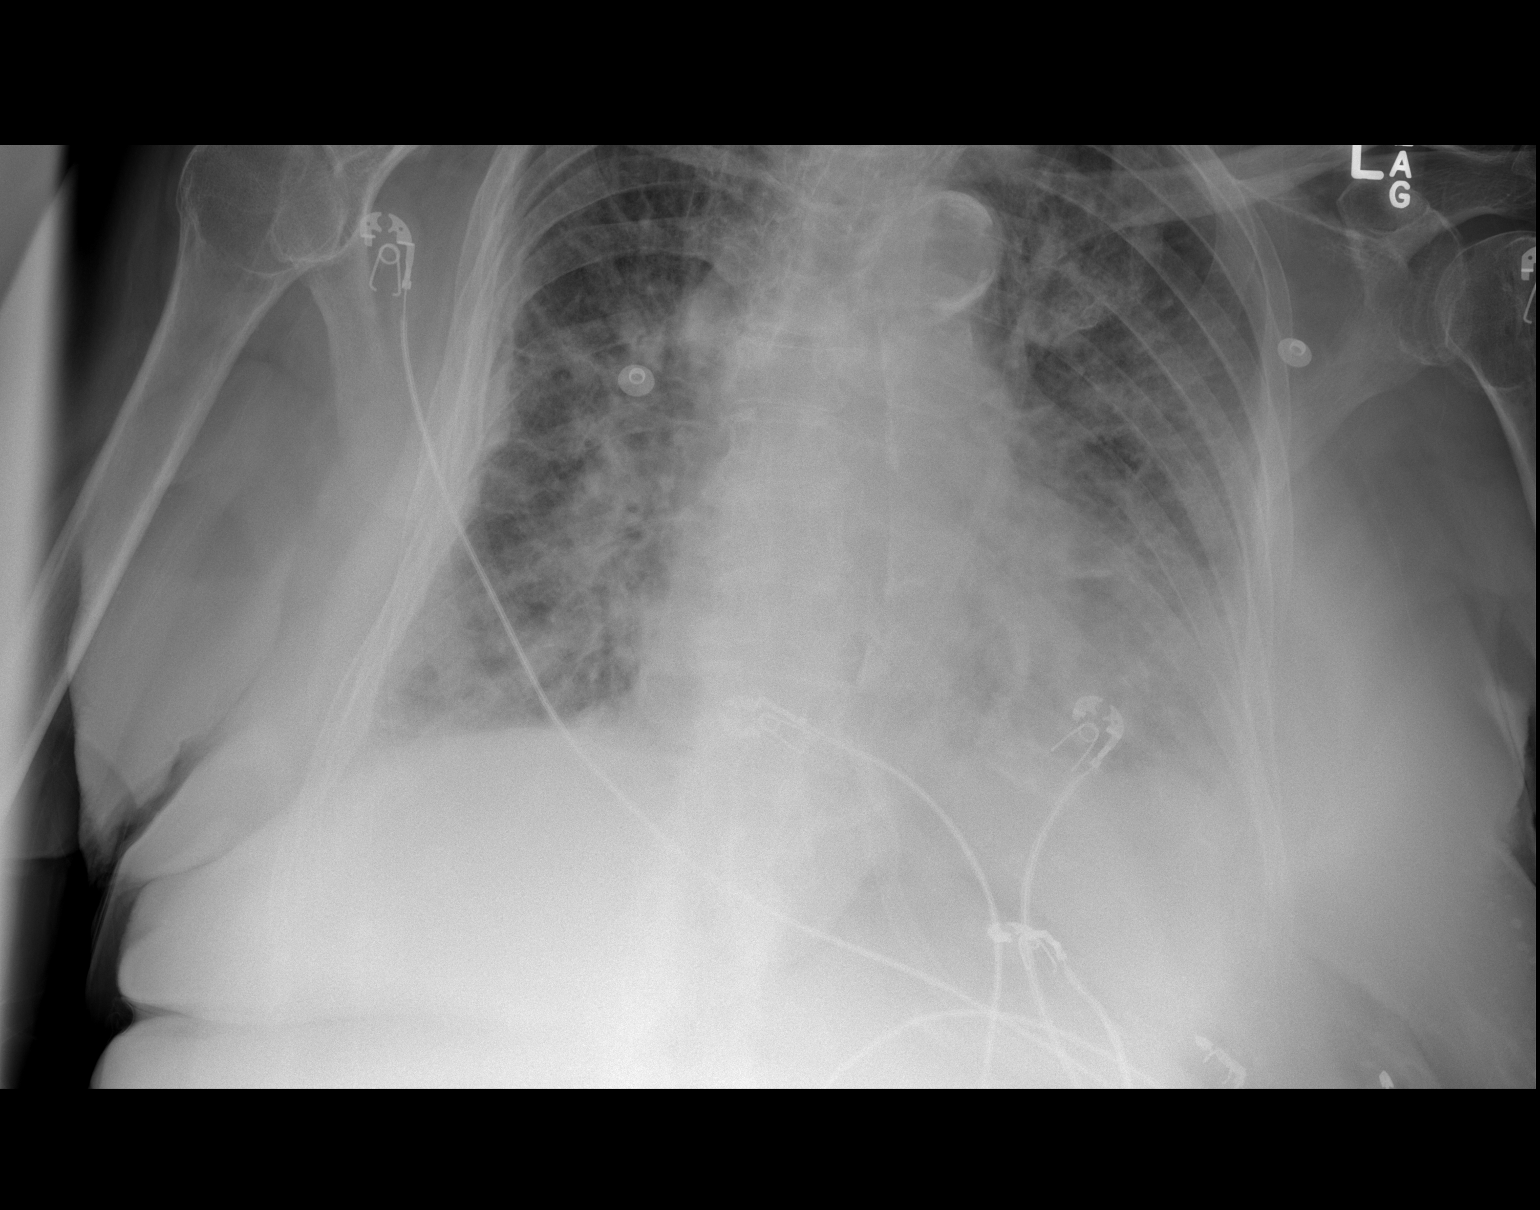

[2 of 2 positions shown; findings below may reference images not displayed]

FINDINGS: Vascular congestion is noted, with small bilateral pleural effusions
and central airspace opacities, compatible with pulmonary edema. No
pneumothorax is seen.

The cardiomediastinal silhouette is borderline enlarged. No acute
osseous abnormalities are identified.
IMPRESSION: Vascular congestion and borderline cardiomegaly, with small
bilateral pleural effusions and central airspace opacities,
compatible with pulmonary edema.

## 2017-09-03 IMAGING — CR DG CHEST 1V PORT
1 series · 1 of 1 positions shown · non-contrast
Comparison: May 22, 2015

CLINICAL DATA: Pain after fall.

EXAM:
PORTABLE CHEST 1 VIEW

[AP]
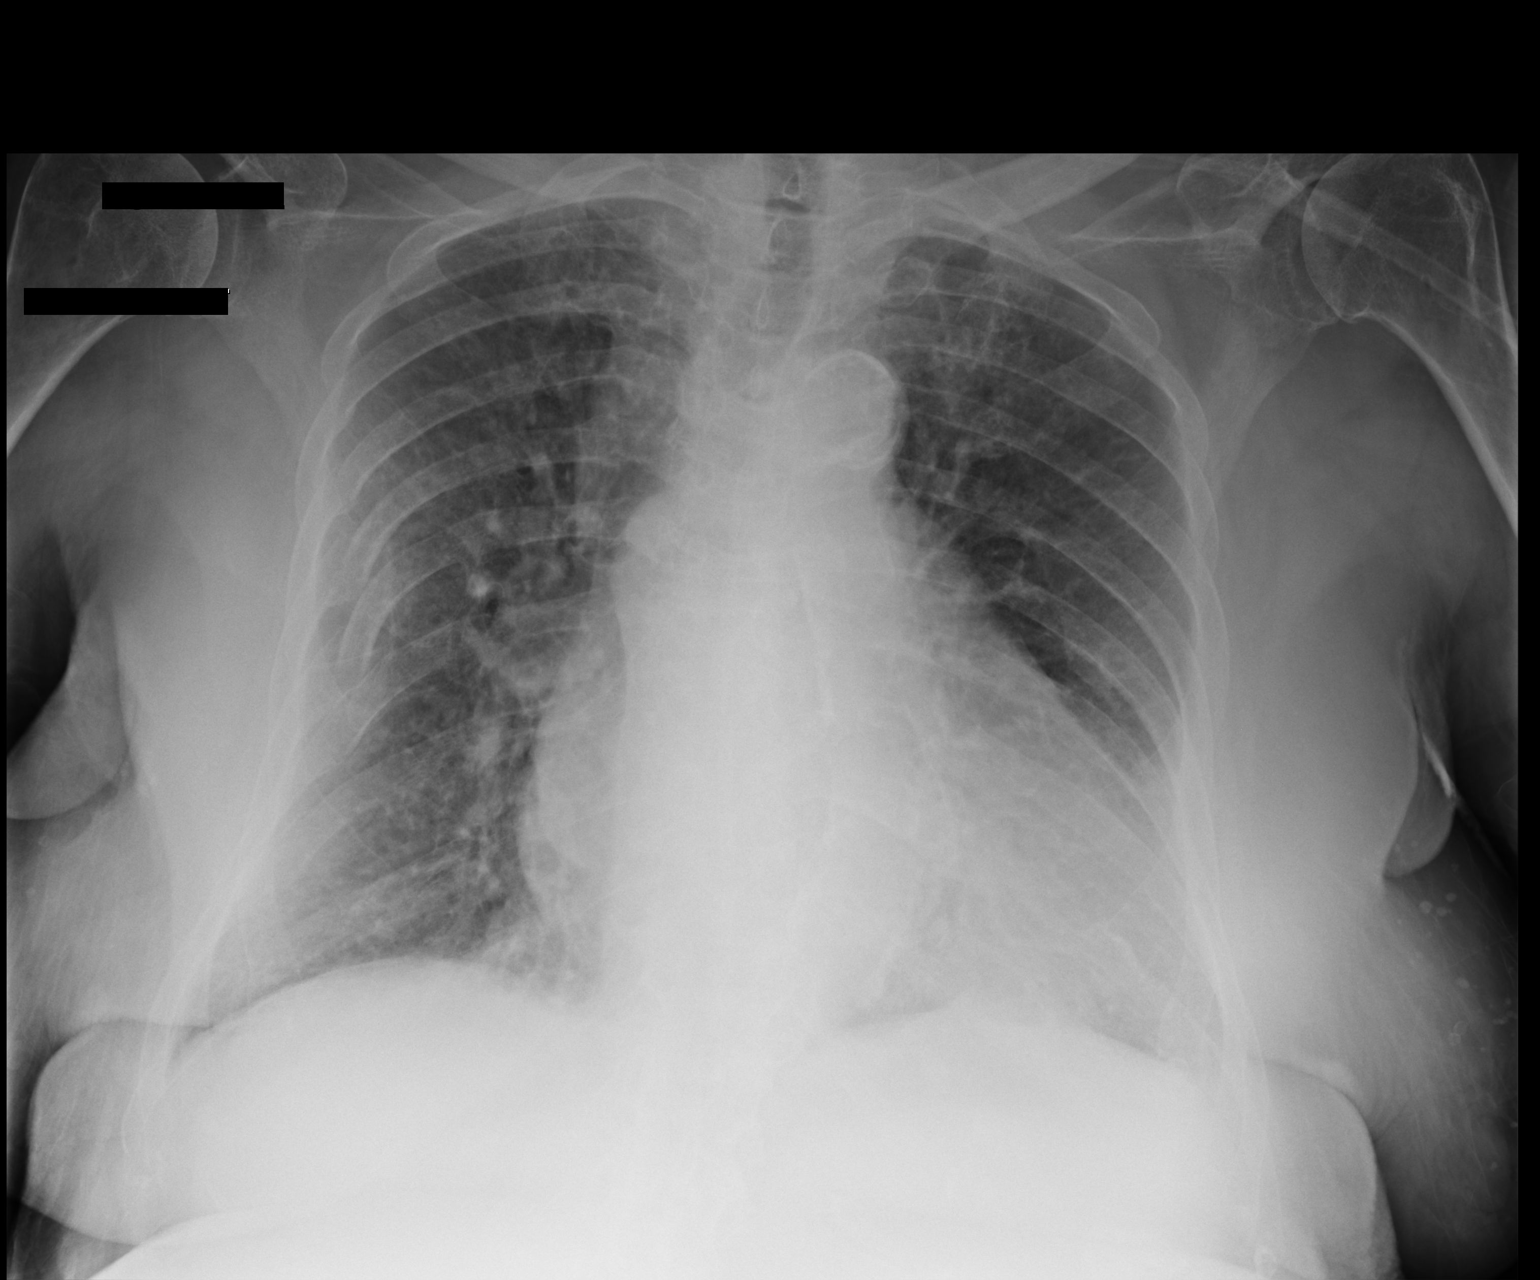

[1 of 1 positions shown; findings below may reference images not displayed]

FINDINGS: Right-sided rib fractures are again seen, unchanged in the interval.
No pneumothorax. Stable cardiomegaly. No pulmonary nodules, masses,
or focal infiltrates. No other changes.
IMPRESSION: No active disease.
